# Patient Record
Sex: Female | Born: 1965 | Race: White | Hispanic: No | Marital: Married | Smoking: Never smoker
Health system: Southern US, Community
[De-identification: ages and names within clinical notes are randomized; demographics above are authoritative.]

## PROBLEM LIST (undated history)

## (undated) DIAGNOSIS — I10 Essential (primary) hypertension: Secondary | ICD-10-CM

## (undated) DIAGNOSIS — R609 Edema, unspecified: Secondary | ICD-10-CM

## (undated) DIAGNOSIS — R682 Dry mouth, unspecified: Secondary | ICD-10-CM

## (undated) DIAGNOSIS — I8390 Asymptomatic varicose veins of unspecified lower extremity: Secondary | ICD-10-CM

## (undated) DIAGNOSIS — E063 Autoimmune thyroiditis: Secondary | ICD-10-CM

## (undated) DIAGNOSIS — E282 Polycystic ovarian syndrome: Secondary | ICD-10-CM

## (undated) DIAGNOSIS — F32A Depression, unspecified: Secondary | ICD-10-CM

## (undated) DIAGNOSIS — K829 Disease of gallbladder, unspecified: Secondary | ICD-10-CM

## (undated) DIAGNOSIS — E079 Disorder of thyroid, unspecified: Secondary | ICD-10-CM

## (undated) DIAGNOSIS — F988 Other specified behavioral and emotional disorders with onset usually occurring in childhood and adolescence: Secondary | ICD-10-CM

## (undated) HISTORY — DX: Disease of gallbladder, unspecified: K82.9

## (undated) HISTORY — DX: Asymptomatic varicose veins of unspecified lower extremity: I83.90

## (undated) HISTORY — DX: Autoimmune thyroiditis: E06.3

## (undated) HISTORY — DX: Depression, unspecified: F32.A

## (undated) HISTORY — PX: OVARIAN CYST REMOVAL: SHX89

## (undated) HISTORY — DX: Essential (primary) hypertension: I10

## (undated) HISTORY — DX: Dry mouth, unspecified: R68.2

## (undated) HISTORY — DX: Edema, unspecified: R60.9

## (undated) HISTORY — DX: Polycystic ovarian syndrome: E28.2

## (undated) HISTORY — DX: Disorder of thyroid, unspecified: E07.9

## (undated) HISTORY — DX: Other specified behavioral and emotional disorders with onset usually occurring in childhood and adolescence: F98.8

---

## 1998-07-25 ENCOUNTER — Other Ambulatory Visit: Admission: RE | Admit: 1998-07-25 | Discharge: 1998-07-25 | Payer: Self-pay | Admitting: *Deleted

## 1998-11-14 ENCOUNTER — Other Ambulatory Visit: Admission: RE | Admit: 1998-11-14 | Discharge: 1998-11-14 | Payer: Self-pay | Admitting: *Deleted

## 1999-12-08 ENCOUNTER — Other Ambulatory Visit: Admission: RE | Admit: 1999-12-08 | Discharge: 1999-12-08 | Payer: Self-pay | Admitting: *Deleted

## 2000-01-02 ENCOUNTER — Encounter (INDEPENDENT_AMBULATORY_CARE_PROVIDER_SITE_OTHER): Payer: Self-pay

## 2000-01-02 ENCOUNTER — Other Ambulatory Visit: Admission: RE | Admit: 2000-01-02 | Discharge: 2000-01-02 | Payer: Self-pay | Admitting: *Deleted

## 2000-02-05 ENCOUNTER — Ambulatory Visit (HOSPITAL_COMMUNITY): Admission: RE | Admit: 2000-02-05 | Discharge: 2000-02-05 | Payer: Self-pay | Admitting: *Deleted

## 2000-05-25 ENCOUNTER — Other Ambulatory Visit: Admission: RE | Admit: 2000-05-25 | Discharge: 2000-05-25 | Payer: Self-pay | Admitting: *Deleted

## 2001-01-03 ENCOUNTER — Encounter (INDEPENDENT_AMBULATORY_CARE_PROVIDER_SITE_OTHER): Payer: Self-pay

## 2001-01-03 ENCOUNTER — Other Ambulatory Visit: Admission: RE | Admit: 2001-01-03 | Discharge: 2001-01-03 | Payer: Self-pay | Admitting: *Deleted

## 2002-02-23 ENCOUNTER — Other Ambulatory Visit: Admission: RE | Admit: 2002-02-23 | Discharge: 2002-02-23 | Payer: Self-pay | Admitting: *Deleted

## 2003-03-27 ENCOUNTER — Other Ambulatory Visit: Admission: RE | Admit: 2003-03-27 | Discharge: 2003-03-27 | Payer: Self-pay | Admitting: *Deleted

## 2006-06-23 ENCOUNTER — Ambulatory Visit: Payer: Self-pay | Admitting: General Practice

## 2007-01-21 ENCOUNTER — Ambulatory Visit (HOSPITAL_COMMUNITY): Admission: RE | Admit: 2007-01-21 | Discharge: 2007-01-21 | Payer: Self-pay | Admitting: Family Medicine

## 2007-02-04 ENCOUNTER — Ambulatory Visit (HOSPITAL_COMMUNITY): Admission: RE | Admit: 2007-02-04 | Discharge: 2007-02-04 | Payer: Self-pay | Admitting: Family Medicine

## 2007-06-28 ENCOUNTER — Ambulatory Visit: Payer: Self-pay | Admitting: General Practice

## 2008-05-09 ENCOUNTER — Ambulatory Visit (HOSPITAL_COMMUNITY): Admission: RE | Admit: 2008-05-09 | Discharge: 2008-05-09 | Payer: Self-pay | Admitting: Family Medicine

## 2008-07-03 ENCOUNTER — Ambulatory Visit: Payer: Self-pay | Admitting: General Practice

## 2009-07-09 ENCOUNTER — Ambulatory Visit: Payer: Self-pay | Admitting: General Practice

## 2009-12-18 ENCOUNTER — Encounter: Payer: Self-pay | Admitting: Cardiovascular Disease

## 2010-01-07 ENCOUNTER — Ambulatory Visit: Payer: Self-pay | Admitting: Vascular Surgery

## 2010-01-07 ENCOUNTER — Ambulatory Visit (HOSPITAL_COMMUNITY): Admission: RE | Admit: 2010-01-07 | Discharge: 2010-01-07 | Payer: Self-pay | Admitting: Nurse Practitioner

## 2010-01-10 DIAGNOSIS — R609 Edema, unspecified: Secondary | ICD-10-CM | POA: Insufficient documentation

## 2010-01-10 DIAGNOSIS — E782 Mixed hyperlipidemia: Secondary | ICD-10-CM | POA: Insufficient documentation

## 2010-01-10 DIAGNOSIS — E785 Hyperlipidemia, unspecified: Secondary | ICD-10-CM | POA: Insufficient documentation

## 2010-01-16 ENCOUNTER — Ambulatory Visit: Payer: Self-pay | Admitting: Cardiovascular Disease

## 2010-01-17 ENCOUNTER — Ambulatory Visit: Payer: Self-pay

## 2010-01-17 ENCOUNTER — Ambulatory Visit: Payer: Self-pay | Admitting: Internal Medicine

## 2010-01-17 ENCOUNTER — Encounter: Payer: Self-pay | Admitting: Cardiovascular Disease

## 2010-01-17 ENCOUNTER — Ambulatory Visit (HOSPITAL_COMMUNITY): Admission: RE | Admit: 2010-01-17 | Discharge: 2010-01-17 | Payer: Self-pay | Admitting: *Deleted

## 2010-03-22 ENCOUNTER — Ambulatory Visit: Payer: Self-pay | Admitting: General Practice

## 2010-07-01 ENCOUNTER — Ambulatory Visit: Payer: Self-pay | Admitting: General Practice

## 2010-09-28 ENCOUNTER — Encounter: Payer: Self-pay | Admitting: Family Medicine

## 2010-10-09 NOTE — Assessment & Plan Note (Signed)
Summary: np6/elevated D-Dimer/swelling/edema ankles.SOB   Visit Type:  Initial Consult Primary Provider:  Elie Goody, [facility]  CC:  pt here for elevated D-dimer...edema/ankles....  History of Present Illness: 45 yo female with history of hypothyroidism here today as a new cardiac patient for further evaluation of bilateral lower extremity swelling for four to six months. The swelling does improve some at night. She was started on Lasix one month ago and this really did not make a difference. She has had no chest pain or SOB. She describes black tongue felt to be secondary to a fungus and was treated with Nystatin. Overall, she feels well. She describes problems with varicosities in the past in the left leg. Recent labwork from primary care included a D-dimer which was mildly elevated at 0.5. Subsequent lower extremity venous dopplers at [facility] were negative for DVT. There was no evidence of a Bakers cyst. She describes no symptoms c/w angina or pulmonary embolism. She has no risk factors for DVT.   Current Medications (verified): 1)  Armour Thyroid 300 Mg Tabs (Thyroid) .Marland Kitchen.. 1 Tab Once Daily 2)  Spironolactone 50 Mg Tabs (Spironolactone) .Marland Kitchen.. 1 Tab Once Daily 3)  Naltrexone Hcl 50 Mg Tabs (Naltrexone Hcl) .Marland Kitchen.. 1 Tab At Bedtime 4)  Vitamin D (Ergocalciferol) 50000 Unit Caps (Ergocalciferol) .Marland Kitchen.. 1 Tab Bi-Weekly 5)  Multivitamins   Tabs (Multiple Vitamin) .Marland Kitchen.. 1 Tab Once Daily 6)  Aspirin 81 Mg Tbec (Aspirin) .... Take One Tablet By Mouth Daily  Allergies (verified): 1)  ! Sulfa  Past History:  Past Medical History: HYPERLIPIDEMIA Hypothryoidism Seasonal alllergies      Past Surgical History: None  Family History: Mother-alive, healthy Father-alive, healthy 3 sisters and one brother-all alive and heatlhy  No CAD.  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  The patient denies fatigue, malaise, fever, weight gain/loss, vision loss, decreased hearing, hoarseness, chest pain, palpitations, shortness of breath, prolonged cough, wheezing, sleep apnea, coughing up blood, abdominal pain, blood in stool, nausea, vomiting, diarrhea, heartburn, incontinence, blood in urine, muscle weakness, joint pain, rash, skin lesions, headache, fainting, dizziness, depression, anxiety, enlarged lymph nodes, easy bruising or bleeding, and environmental allergies.    Vital Signs:  Patient profile:   45 year old female Height:      66 inches Weight:      204 pounds BMI:     33.05 Pulse rate:   81 / minute Pulse rhythm:   regular BP sitting:   120 / 80  (left arm) Cuff size:   large  Vitals Entered By: Danielle Rankin, CMA (Jan 16, 2010 3:36 PM)  Physical Exam  General:  General: Well developed, well nourished, NAD HEENT: OP clear, mucus membranes moist SKIN: warm, dry Neuro: No focal deficits Musculoskeletal: Muscle strength 5/5 all ext Psychiatric: Mood and affect normal Neck: No JVD, no carotid bruits, no thyromegaly, no lymphadenopathy. Lungs:Clear bilaterally, no wheezes, rhonci, crackles CV: RRR no murmurs, gallops rubs Abdomen: soft, NT, ND, BS present Extremities: Trace lower extremity edema, pulses 2+.    EKG  Procedure date:  01/16/2010  Findings:      NSR, rate 81 bpm. Normal EKG.  Impression & Recommendations:  Problem # 1:  EDEMA (ICD-782.3) Her edema has been present for at least several months and seems to be dependent. There was no real improvement with Lasix. No  evidence of DVT on recent venous dopplers. No evidence of malnutrition. No offending medications. It is possible that there is venous insufficiency with imcompetence of her lower extremity veins. Her risk of obstructive CAD is low as her only cardiac risk factor is borderline hyperlipidemia. Will check an echo to make sure her LV function is normal.  I have low suspicion for pulmonary emblolism, as she has no tachycardia, chest pain or SOB. The elevated D-dimer is non-specific and in the setting of negative lower extremity dopplers and no SOB/CP, most likely not meaningful. If her echo shows normal LV function, I would suggest referral to a vein specialist. Dr. Molli Hazard specializing in venous disorders.   Other Orders: Echocardiogram (Echo)  Patient Instructions: 1)  Your physician recommends that you schedule a follow-up appointment as needed 2)  Your physician has requested that you have an echocardiogram.  Echocardiography is a painless test that uses sound waves to create images of your heart. It provides your doctor with information about the size and shape of your heart and how well your heart's chambers and valves are working.  This procedure takes approximately one hour. There are no restrictions for this procedure.

## 2011-01-23 NOTE — Op Note (Signed)
[facility]  Patient:    Olivia Hamilton, Olivia Hamilton                      MRN: [id number] Proc. Date: 02/05/00 Adm. 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  OPERATION:                    Laser vaporization of dysplasia and condyloma.  SURGEON:                      Georgina Peer, M.D.  ASSISTANT:  ANESTHESIA:                   General plus 10 cc of 1% Xylocaine with epinephrine 1:100,000 intracervical block.  ESTIMATED BLOOD LOSS:         Less than 25 cc.  COMPLICATIONS:                None.  INDICATIONS:                  A 45 year old white female with abnormal Pap smear, colposcopy revealed posterior cervical lip condyloma and cervical dysplasia. The dysplasia was of a mild grade.  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 She is brought in for treatment of these lesions.  DESCRIPTION OF PROCEDURE:     She was taken to the operating room and after given a general anesthesia, placed in the dorsal lithotomy position.  External genitals  were inspected and no external genital condyloma or vaginal condyloma were noted. Acetic acid was applied to the cervix and cervical condyloma and also punctation was noted as previously described in colposcopy in the office.  An intracervical block using 10 cc of 1% Xylocaine with epinephrine 1:100,000 was injected and the cervix blanched.  Lugols iodine stained the cervix and nonstaining areas were identified.  An 8 mm margin around the nonstaining areas was taken with the CO2  laser in a defocused mode using 25 watts of power.  3 to 4 mm of destruction including the entire condylomatous mass was vaporized using the CO2 laser.  The  transformation zone was vaporized to a  level of 10 mm.  There was excellent hemostasis.  The char was wiped away and Monsels applied to the treated area. Sponge, needle, and instrument counts were correct.  The patient was returned to the recovery room in good condition. DD:  02/05/00 TD:  02/05/00 Job: [id number]

## 2011-06-30 ENCOUNTER — Ambulatory Visit: Payer: Self-pay

## 2012-02-08 ENCOUNTER — Encounter: Payer: Self-pay | Admitting: Vascular Surgery

## 2012-02-09 ENCOUNTER — Encounter: Payer: Self-pay | Admitting: Vascular Surgery

## 2012-02-09 ENCOUNTER — Ambulatory Visit (INDEPENDENT_AMBULATORY_CARE_PROVIDER_SITE_OTHER): Payer: BC Managed Care – PPO | Admitting: *Deleted

## 2012-02-09 ENCOUNTER — Ambulatory Visit (INDEPENDENT_AMBULATORY_CARE_PROVIDER_SITE_OTHER): Payer: BC Managed Care – PPO | Admitting: Vascular Surgery

## 2012-02-09 VITALS — BP 157/92 | HR 80 | Resp 16 | Ht 66.0 in | Wt 195.0 lb

## 2012-02-09 DIAGNOSIS — I83893 Varicose veins of bilateral lower extremities with other complications: Secondary | ICD-10-CM

## 2012-02-09 DIAGNOSIS — I8310 Varicose veins of unspecified lower extremity with inflammation: Secondary | ICD-10-CM

## 2012-02-09 NOTE — Progress Notes (Signed)
Subjective:     Patient ID: Olivia Hamilton, female   DOB: [birth year], 46 y.o.   MRN: [id number]  HPI this 45 year old female has had bulging varicose veins in her left medial calf since she was a teenager. She has no history of DVT or thrombophlebitis. She has developed increasing aching throbbing and burning discomfort in the distal thigh and calf extending down into the ankle and her left lower leg from the midcalf distally swells as the day progresses. She is unable to elevate her legs much at work. She would occasionally wear elastic compression stockings with no improvement. She has no history of stasis ulcers, bleeding, or other complications. She has no symptoms in the contralateral right leg. She was seen by Dr. early 11 years ago but did not desire treatment at that time and her symptoms have become significantly worse as well as the onset of the swelling. It is now beginning to affect her daily living and ability to work.  Past Medical History  Diagnosis Date  . Varicose veins     History  Substance Use Topics  . Smoking status: Never Smoker   . Smokeless tobacco: Not on file  . Alcohol Use: 1.2 oz/week    1 Glasses of wine, 1 Cans of beer per week    History reviewed. No pertinent family history.  Allergies  Allergen Reactions  . Sulfonamide Derivatives     REACTION: eye swelling    Current outpatient prescriptions:aspirin 81 MG tablet, Take 81 mg by mouth daily., Disp: , Rfl: ;  lisdexamfetamine (VYVANSE) 20 MG capsule, Take 20 mg by mouth every morning., Disp: , Rfl: ;  progesterone (PROMETRIUM) 100 MG capsule, Take 100 mg by mouth daily., Disp: , Rfl: ;  thyroid (ARMOUR) 300 MG tablet, Take 300 mg by mouth daily., Disp: , Rfl:   BP 157/92  Pulse 80  Resp 16  Ht 5\' 6"  (1.676 m)  Wt 195 lb (88.451 kg)  BMI 31.47 kg/m2  Body mass index is 31.47 kg/(m^2).          Review of Systems denies chest pain, dyspnea on exertion, PND, orthopnea, chronic bronchitis,  claudication.-All systems negative complete review of systems    Objective:   Physical Exam blood pressure 157/92 heart rate 80 respirations 16 Gen.-alert and oriented x3 in no apparent distress HEENT normal for age Lungs no rhonchi or wheezing Cardiovascular regular rhythm no murmurs carotid pulses 3+ palpable no bruits audible Abdomen soft nontender no palpable masses Musculoskeletal free of  major deformities Skin clear -no rashes Neurologic normal Lower extremities 3+ femoral and dorsalis pedis pulses palpable bilaterally with 1+ edema left ankle no edema right ankle. Left leg with bulging varicosities in the proximal medial calf area over the great saphenous system extending into the posterior calf and down distally toward the medial malleolus. There is minimal hyperpigmentation no active ulcers are noted.  Today I ordered a venous duplex exam of the left leg which are reviewed and interpreted. There is no DVT. The left great saphenous vein has gross reflux from the knee to the saphenofemoral junction and is supplying these bulging varicosities. Small saphenous vein is unremarkable      Assessment:     severe venous insufficiency left leg with secondary bulging varicosities causing significant symptoms affecting patient's daily living and ability to work  Severe reflux left great saphenous vein supplying these bulging varicosities    Plan:     #1 long-leg elastic compression stockings 20-30 mm gradient #2  elevate legs at home is much as possible #3 ibuprofen on daily basis #4 return in 3 months-no significant improvement she needs a laser ablation left great saphenous vein with 10-20 stab phlebectomy of secondary varicosities as single procedure

## 2012-02-15 NOTE — Procedures (Unsigned)
LOWER EXTREMITY VENOUS REFLUX EXAM  INDICATION:  Left varicose veins with pain and discomfort.  EXAM:  Using color-flow imaging and pulse Doppler spectral analysis, the left common femoral, superficial femoral, popliteal, posterior tibial, greater and lesser saphenous veins are evaluated.  There is evidence suggesting deep venous insufficiency in the left lower extremity.  The left saphenofemoral junction is not competent with reflux of >500 milliseconds. The left GSV is not competent with reflux of >500 milliseconds with the caliber as described below.   The left proximal short saphenous vein demonstrates competency.  GSV Diameter (used if found to be incompetent only)                                           Right    Left Proximal Greater Saphenous Vein           cm       0.98 cm Proximal-to-mid-thigh                     cm       0.77 cm Mid thigh                                 cm       0.60 cm Mid-distal thigh                          cm       cm Distal thigh                              cm       0.67 cm Knee                                      cm       0.57 cm  IMPRESSION: 1. The left greater saphenous vein is not competent with reflux >500     milliseconds. 2. The left greater saphenous vein is not tortuous. 3. The deep venous system is not competent with reflux of >500     milliseconds. 4. The left small saphenous vein is competent.  ___________________________________________ Quita Skye. Hart Rochester, M.D.  EM/MEDQ  D:  02/10/2012  T:  02/10/2012  Job:  [id number]

## 2012-05-16 ENCOUNTER — Encounter: Payer: Self-pay | Admitting: Vascular Surgery

## 2012-05-17 ENCOUNTER — Encounter: Payer: Self-pay | Admitting: Vascular Surgery

## 2012-05-17 ENCOUNTER — Ambulatory Visit (INDEPENDENT_AMBULATORY_CARE_PROVIDER_SITE_OTHER): Payer: BC Managed Care – PPO | Admitting: Vascular Surgery

## 2012-05-17 VITALS — BP 140/92 | HR 73 | Resp 18 | Ht 66.0 in | Wt 190.0 lb

## 2012-05-17 DIAGNOSIS — I83893 Varicose veins of bilateral lower extremities with other complications: Secondary | ICD-10-CM

## 2012-05-17 NOTE — Progress Notes (Signed)
Subjective:     Patient ID: Olivia Hamilton, female   DOB: 10-Sep-1965, 46 y.o.   MRN: [id number]  HPI this 45 year old female returns for further followup regarding her severe venous insufficiency of the left leg. She has had aching throbbing and burning discomfort in these painful varicosities in the lower thigh and medial calf area for the past several years which have worsened over the past several months. She has tried long-leg elastic compression stockings-20-30 mm gradient as well as elevation and ibuprofen and this has not improved her symptomatology. She has gross reflux in the left great saphenous vein supplying these bulging varicosities which was demonstrated on formal ultrasound study at her last visit in June of 2013. She has no history of stasis ulcers or bleeding or DVT.  Past Medical History  Diagnosis Date  . Varicose veins   . Thyroid disease     History  Substance Use Topics  . Smoking status: Never Smoker   . Smokeless tobacco: Never Used  . Alcohol Use: 1.2 oz/week    1 Glasses of wine, 1 Cans of beer per week    Family History  Problem Relation Age of Onset  . Other Mother     VARICOSE VEINS  . Arthritis Mother   . Hypertension Mother   . Hypertension Father     Allergies  Allergen Reactions  . Sulfonamide Derivatives     REACTION: eye swelling    Current outpatient prescriptions:aspirin 81 MG tablet, Take 81 mg by mouth daily., Disp: , Rfl: ;  lisdexamfetamine (VYVANSE) 20 MG capsule, Take 20 mg by mouth every morning., Disp: , Rfl: ;  progesterone (PROMETRIUM) 100 MG capsule, Take 100 mg by mouth daily., Disp: , Rfl: ;  thyroid (ARMOUR) 300 MG tablet, Take 300 mg by mouth daily., Disp: , Rfl:   BP 140/92  Pulse 73  Resp 18  Ht 5\' 6"  (1.676 m)  Wt 190 lb (86.183 kg)  BMI 30.67 kg/m2  Body mass index is 30.67 kg/(m^2).           Review of Systems denies chest pain, dyspnea on exertion, PND, orthopnea, claudication to     Objective:   Physical Exam blood pressure 140/92 heart rate 73 respirations 18 Lungs no rhonchi or wheezing Cardiovascular rhythm no murmurs Left leg with bulging varicosities in the greater saphenous systems beginning in the distal medial thigh extending into the medial calf anteriorly and posteriorly. There is 1+ distal edema with no ulceration or hyperpigmentation noted. 3+ dorsalis pedis pulses palpable.    Assessment:     Severe venous insufficiency left leg with gross reflux left great saphenous system supplying bulging varicosities which are resistant to medical management and affecting patient daily living    Plan:     Patient needs a laser ablation left great saphenous vein with greater than 20 stab phlebectomy to treat these painful varicosities. Will proceed with precertification to perform this in near future

## 2012-05-24 ENCOUNTER — Other Ambulatory Visit: Payer: Self-pay | Admitting: *Deleted

## 2012-05-24 DIAGNOSIS — I83893 Varicose veins of bilateral lower extremities with other complications: Secondary | ICD-10-CM

## 2012-06-28 ENCOUNTER — Ambulatory Visit: Payer: Self-pay

## 2012-07-04 ENCOUNTER — Other Ambulatory Visit: Payer: BC Managed Care – PPO | Admitting: Vascular Surgery

## 2012-07-12 ENCOUNTER — Ambulatory Visit: Payer: BC Managed Care – PPO | Admitting: Vascular Surgery

## 2012-07-22 ENCOUNTER — Other Ambulatory Visit: Payer: Self-pay | Admitting: *Deleted

## 2012-07-22 DIAGNOSIS — I83893 Varicose veins of bilateral lower extremities with other complications: Secondary | ICD-10-CM

## 2012-09-26 ENCOUNTER — Other Ambulatory Visit: Payer: BC Managed Care – PPO | Admitting: Vascular Surgery

## 2012-09-27 ENCOUNTER — Ambulatory Visit: Payer: BC Managed Care – PPO | Admitting: Vascular Surgery

## 2012-10-03 ENCOUNTER — Other Ambulatory Visit: Payer: Self-pay | Admitting: *Deleted

## 2012-10-03 DIAGNOSIS — I83893 Varicose veins of bilateral lower extremities with other complications: Secondary | ICD-10-CM

## 2012-10-04 ENCOUNTER — Ambulatory Visit: Payer: BC Managed Care – PPO | Admitting: Vascular Surgery

## 2012-10-14 ENCOUNTER — Encounter: Payer: Self-pay | Admitting: Vascular Surgery

## 2012-10-17 ENCOUNTER — Ambulatory Visit (INDEPENDENT_AMBULATORY_CARE_PROVIDER_SITE_OTHER): Payer: PRIVATE HEALTH INSURANCE | Admitting: Vascular Surgery

## 2012-10-17 ENCOUNTER — Encounter: Payer: Self-pay | Admitting: Vascular Surgery

## 2012-10-17 VITALS — BP 142/96 | HR 80 | Resp 18 | Ht 66.0 in | Wt 190.0 lb

## 2012-10-17 DIAGNOSIS — I83893 Varicose veins of bilateral lower extremities with other complications: Secondary | ICD-10-CM

## 2012-10-17 NOTE — Progress Notes (Signed)
Laser Ablation Procedure      Date: 10/17/2012    Olivia Hamilton DOB:1966/07/22  Consent signed: Yes  Surgeon:J.D. Hart Rochester  Procedure: Laser Ablation: left Greater Saphenous Vein  BP 142/96  Pulse 80  Resp 18  Ht 5\' 6"  (1.676 m)  Wt 190 lb (86.183 kg)  BMI 30.68 kg/m2  Start time: 3:05   End time: 4:15  Tumescent Anesthesia: 300 cc 0.9% NaCl with 50 cc Lidocaine HCL with 1% Epi and 15 cc 8.4% NaHCO3  Local Anesthesia: 9 cc Lidocaine HCL and NaHCO3 (ratio 2:1)  Pulsed mode:yes Watts 15 Seconds 1 Pulses:1 Total Pulses:140 Total Energy: 2100 Total Time: 2:20     Stab Phlebectomy: >20 Sites: Thigh and Calf  Patient tolerated procedure well: Yes  Notes:   Description of Procedure:  After marking the course of the saphenous vein and the secondary varicosities in the standing position, the patient was placed on the operating table in the supine position, and the left leg was prepped and draped in sterile fashion. Local anesthetic was administered, and under ultrasound guidance the saphenous vein was accessed with a micro needle and guide wire; then the micro puncture sheath was placed. A guide wire was inserted to the saphenofemoral junction, followed by a 5 french sheath.  The position of the sheath and then the laser fiber below the junction was confirmed using the ultrasound and visualization of the aiming beam.  Tumescent anesthesia was administered along the course of the saphenous vein using ultrasound guidance. Protective laser glasses were placed on the patient, and the laser was fired at 15 watt pulsed mode advancing 1-2 mm per sec.  For a total of 2100 joules.  A steri strip was applied to the puncture site.  The patient was then put into Trendelenburg position.  Local anesthetic was utilized overlying the marked varicosities.  Greater than 20 stab wounds were made using the tip of an 11 blade; and using the vein hook,  The phlebectomies were performed using a hemostat to  avulse these varicosities.  Adequate hemostasis was achieved, and steri strips were applied to the stab wound.      ABD pads and thigh high compression stockings were applied.  Ace wrap bandages were applied over the phlebectomy sites and at the top of the saphenofemoral junction.  Blood loss was less than 15 cc.  The patient ambulated out of the operating room having tolerated the procedure well.

## 2012-10-17 NOTE — Progress Notes (Signed)
Subjective:     Patient ID: Olivia Hamilton, female   DOB: 1966/03/24, 47 y.o.   MRN: [id number]  HPI this 47 year old female had laser ablation of left great saphenous vein from the knee to the saphenofemoral junction plus greater than 20 stab phlebectomy of secondary bulging varicosities performed under local tumescent anesthesia. She tolerated the procedure   Review of Systems     Objective:   Physical Exam BP 142/96  Pulse 80  Resp 18  Ht 5\' 6"  (1.676 m)  Wt 190 lb (86.183 kg)  BMI 30.68 kg/m2        Assessment:    well-tolerated laser ablation left great saphenous vein plus greater than 20 stab phlebectomy of secondary varicosities performed under local tumescent anesthesia 2100 J of energy utilized    Plan:     Return February 25 for venous duplex exam left leg to confirm closure left great saphenous vein

## 2012-10-18 ENCOUNTER — Telehealth: Payer: Self-pay | Admitting: *Deleted

## 2012-10-18 ENCOUNTER — Encounter: Payer: Self-pay | Admitting: Vascular Surgery

## 2012-10-18 NOTE — Telephone Encounter (Signed)
Pt having slight discomfort and needed clarification on instructions. Otherwise doing well. Reminded her of her fu appt.

## 2012-10-31 ENCOUNTER — Encounter: Payer: Self-pay | Admitting: Neurosurgery

## 2012-11-01 ENCOUNTER — Encounter (INDEPENDENT_AMBULATORY_CARE_PROVIDER_SITE_OTHER): Payer: PRIVATE HEALTH INSURANCE

## 2012-11-01 ENCOUNTER — Encounter: Payer: Self-pay | Admitting: Vascular Surgery

## 2012-11-01 ENCOUNTER — Ambulatory Visit (INDEPENDENT_AMBULATORY_CARE_PROVIDER_SITE_OTHER): Payer: PRIVATE HEALTH INSURANCE | Admitting: Vascular Surgery

## 2012-11-01 VITALS — BP 148/106 | HR 83 | Resp 16 | Ht 66.0 in | Wt 190.0 lb

## 2012-11-01 DIAGNOSIS — I83893 Varicose veins of bilateral lower extremities with other complications: Secondary | ICD-10-CM

## 2012-11-01 DIAGNOSIS — Z48812 Encounter for surgical aftercare following surgery on the circulatory system: Secondary | ICD-10-CM

## 2012-11-01 NOTE — Progress Notes (Signed)
Subjective:     Patient ID: Olivia Hamilton, female   DOB: 06-01-1966, 47 y.o.   MRN: [id number]  HPI this 47 year old female returns 2 weeks post laser ablation left great saphenous vein with multiple stab phlebectomy for painful varicosities she has had some mild-to-moderate discomfort along the course of the great saphenous vein from the mid thigh to the inguinal area. This is improving. She did have a headache for a few days but that has resolved. She has had no significant distal edema and is wearing her elastic stockings and took ibuprofen for one week.    Review of Systems     Objective:   Physical Exam BP 148/106  Pulse 83  Resp 16  Ht 5\' 6"  (1.676 m)  Wt 190 lb (86.183 kg)  BMI 30.68 kg/m2  General well-developed well-nourished female in no apparent stress alert and oriented x3 Left lower extremity with palpable great saphenous vein from mid thigh to inguinal area which is mildly tender. No ecchymosis noted. Stab phlebectomy sites healing nicely. No distal edema noted.  Today I ordered a venous duplex exam which I reviewed and interpreted. Left leg great saphenous vein is totally closed up to near the saphenofemoral junction and there is no DVT     Assessment:     Successful and well-tolerated laser ablation left great saphenous vein with multiple stab phlebectomy for painful varicosities     Plan:     Return to see [place] on when necessary basis

## 2013-07-05 ENCOUNTER — Ambulatory Visit: Payer: Self-pay | Admitting: General Practice

## 2014-07-03 ENCOUNTER — Ambulatory Visit: Payer: Self-pay | Admitting: General Practice

## 2015-06-06 ENCOUNTER — Other Ambulatory Visit: Payer: Self-pay

## 2015-08-13 ENCOUNTER — Other Ambulatory Visit: Payer: Self-pay | Admitting: Obstetrics and Gynecology

## 2015-08-13 DIAGNOSIS — Z803 Family history of malignant neoplasm of breast: Secondary | ICD-10-CM

## 2015-12-04 ENCOUNTER — Other Ambulatory Visit: Payer: Self-pay | Admitting: Pain Medicine

## 2015-12-04 DIAGNOSIS — E041 Nontoxic single thyroid nodule: Secondary | ICD-10-CM

## 2016-04-29 ENCOUNTER — Ambulatory Visit
Admission: RE | Admit: 2016-04-29 | Discharge: 2016-04-29 | Disposition: A | Discharge status: Home / Self Care | Payer: PRIVATE HEALTH INSURANCE | Source: Ambulatory Visit | Attending: Pain Medicine | Admitting: Pain Medicine

## 2016-04-29 DIAGNOSIS — E041 Nontoxic single thyroid nodule: Secondary | ICD-10-CM

## 2017-01-28 ENCOUNTER — Ambulatory Visit: Payer: Self-pay | Admitting: *Deleted

## 2017-01-28 VITALS — BP 144/92 | Ht 66.0 in | Wt 219.0 lb

## 2017-01-28 DIAGNOSIS — Z0000 Encounter for general adult medical examination without abnormal findings: Secondary | ICD-10-CM

## 2017-01-28 NOTE — Progress Notes (Signed)
[employer] insurance premium discount evaluation: Labs Drawn. Replacements ROI form signed. Tobacco Free Attestation form signed.  Forms placed in paper chart.  

## 2017-01-29 LAB — CMP12+LP+TP+TSH+6AC+CBC/D/PLT
A/G RATIO: 2 (ref 1.2–2.2)
ALBUMIN: 4.3 g/dL (ref 3.5–5.5)
ALK PHOS: 103 IU/L (ref 39–117)
ALT: 23 IU/L (ref 0–32)
AST: 22 IU/L (ref 0–40)
BILIRUBIN TOTAL: 0.4 mg/dL (ref 0.0–1.2)
BUN/Creatinine Ratio: 15 (ref 9–23)
BUN: 13 mg/dL (ref 6–24)
Basophils Absolute: 0 10*3/uL (ref 0.0–0.2)
Basos: 1 %
CHLORIDE: 101 mmol/L (ref 96–106)
CHOL/HDL RATIO: 3 ratio (ref 0.0–4.4)
CREATININE: 0.88 mg/dL (ref 0.57–1.00)
Calcium: 9.3 mg/dL (ref 8.7–10.2)
Cholesterol, Total: 187 mg/dL (ref 100–199)
EOS (ABSOLUTE): 0.2 10*3/uL (ref 0.0–0.4)
Eos: 4 %
Estimated CHD Risk: <0.5 times avg. (ref 0.0–1.0)
Free Thyroxine Index: 1.3 (ref 1.2–4.9)
GFR, EST AFRICAN AMERICAN: 87 mL/min/{1.73_m2} (ref >59)
GFR, EST NON AFRICAN AMERICAN: 76 mL/min/{1.73_m2} (ref >59)
GGT: 13 IU/L (ref 0–60)
Globulin, Total: 2.1 g/dL (ref 1.5–4.5)
Glucose: 86 mg/dL (ref 65–99)
HDL: 63 mg/dL (ref >39)
Hematocrit: 42.9 % (ref 34.0–46.6)
Hemoglobin: 14.3 g/dL (ref 11.1–15.9)
IRON: 81 ug/dL (ref 27–159)
Immature Grans (Abs): 0 10*3/uL (ref 0.0–0.1)
Immature Granulocytes: 0 %
LDH: 205 IU/L (ref 119–226)
LDL Calculated: 109 mg/dL — ABNORMAL HIGH (ref 0–99)
LYMPHS ABS: 1.9 10*3/uL (ref 0.7–3.1)
Lymphs: 34 %
MCH: 29.4 pg (ref 26.6–33.0)
MCHC: 33.3 g/dL (ref 31.5–35.7)
MCV: 88 fL (ref 79–97)
MONOS ABS: 0.5 10*3/uL (ref 0.1–0.9)
Monocytes: 8 %
NEUTROS ABS: 2.9 10*3/uL (ref 1.4–7.0)
Neutrophils: 53 %
POTASSIUM: 4.2 mmol/L (ref 3.5–5.2)
Phosphorus: 2.6 mg/dL (ref 2.5–4.5)
Platelets: 249 10*3/uL (ref 150–379)
RBC: 4.86 x10E6/uL (ref 3.77–5.28)
RDW: 13.6 % (ref 12.3–15.4)
Sodium: 140 mmol/L (ref 134–144)
T3 Uptake Ratio: 21 % — ABNORMAL LOW (ref 24–39)
T4 TOTAL: 6.4 ug/dL (ref 4.5–12.0)
TSH: 0.009 u[IU]/mL — AB (ref 0.450–4.500)
Total Protein: 6.4 g/dL (ref 6.0–8.5)
Triglycerides: 73 mg/dL (ref 0–149)
Uric Acid: 5.9 mg/dL (ref 2.5–7.1)
VLDL Cholesterol Cal: 15 mg/dL (ref 5–40)
WBC: 5.6 10*3/uL (ref 3.4–10.8)

## 2017-01-29 LAB — HGB A1C W/O EAG: HEMOGLOBIN A1C: 5.1 % (ref 4.8–5.6)

## 2017-02-05 NOTE — Progress Notes (Signed)
Results reviewed with pt. Sts her pcp removed gluten and dairy from her diet so she was expecting an improvement in her lipids. No exercise routine currently. Encouraged 150 min/week. Reports difficult to get fiber in her diet due to avoiding gluten. Advised otc supplements such as Metamucil. Unable to find her PCP's practice in [place]. [facility] in [place]. Second copy given to pt to take to next pcp appt.

## 2017-07-15 ENCOUNTER — Ambulatory Visit: Payer: Self-pay | Admitting: Registered Nurse

## 2017-07-15 VITALS — BP 124/99 | HR 64 | Temp 98.4°F

## 2017-07-15 DIAGNOSIS — J0101 Acute recurrent maxillary sinusitis: Secondary | ICD-10-CM

## 2017-07-15 MED ORDER — SALINE SPRAY 0.65 % NA SOLN: 2.0000 | NASAL | 0 refills | Status: DC

## 2017-07-15 MED ORDER — FLUTICASONE PROPIONATE 50 MCG/ACT NA SUSP: 1.0000 | Freq: Two times a day (BID) | NASAL | 0 refills | Status: DC

## 2017-07-15 MED ORDER — AZITHROMYCIN 250 MG PO TABS: ORAL_TABLET | ORAL | 0 refills | Status: DC

## 2017-07-15 MED ORDER — ALBUTEROL SULFATE HFA 108 (90 BASE) MCG/ACT IN AERS: 1.0000 | INHALATION_SPRAY | RESPIRATORY_TRACT | 0 refills | Status: DC | PRN

## 2017-07-15 NOTE — Patient Instructions (Signed)
Sinus Rinse What is a sinus rinse? A sinus rinse is a simple home treatment that is used to rinse your sinuses with a sterile mixture of salt and water (saline solution). Sinuses are air-filled spaces in your skull behind the bones of your face and forehead that open into your nasal cavity. You will use the following:  Saline solution.  Neti pot or spray bottle. This releases the saline solution into your nose and through your sinuses. Neti pots and spray bottles can be purchased at your local pharmacy, a health food store, or online.  When would I do a sinus rinse? A sinus rinse can help to clear mucus, dirt, dust, or pollen from the nasal cavity. You may do a sinus rinse when you have a cold, a virus, nasal allergy symptoms, a sinus infection, or stuffiness in the nose or sinuses. If you are considering a sinus rinse:  Ask your child's health care provider before performing a sinus rinse on your child.  Do not do a sinus rinse if you have had ear or nasal surgery, ear infection, or blocked ears.  How do I do a sinus rinse?  Wash your hands.  Disinfect your device according to the directions provided and then dry it.  Use the solution that comes with your device or one that is sold separately in stores. Follow the mixing directions on the package.  Fill your device with the amount of saline solution as directed by the device instructions.  Stand over a sink and tilt your head sideways over the sink.  Place the spout of the device in your upper nostril (the one closer to the ceiling).  Gently pour or squeeze the saline solution into the nasal cavity. The liquid should drain to the lower nostril if you are not overly congested.  Gently blow your nose. Blowing too hard may cause ear pain.  Repeat in the other nostril.  Clean and rinse your device with clean water and then air-dry it. Are there risks of a sinus rinse? Sinus rinse is generally very safe and effective. However,  there are a few risks, which include:  A burning sensation in the sinuses. This may happen if you do not make the saline solution as directed. Make sure to follow all directions when making the saline solution.  Infection from contaminated water. This is rare, but possible.  Nasal irritation.  This information is not intended to replace advice given to you by your health care provider. Make sure you discuss any questions you have with your health care provider. Document Released: 03/21/2014 Document Revised: 07/21/2016 Document Reviewed: 01/09/2014 Elsevier Interactive Patient Education  2017 [employer] Sinusitis, Adult Sinusitis is soreness and inflammation of your sinuses. Sinuses are hollow spaces in the bones around your face. Your sinuses are located:  Around your eyes.  In the middle of your forehead.  Behind your nose.  In your cheekbones.  Your sinuses and nasal passages are lined with a stringy fluid (mucus). Mucus normally drains out of your sinuses. When your nasal tissues become inflamed or swollen, the mucus can become trapped or blocked so air cannot flow through your sinuses. This allows bacteria, viruses, and funguses to grow, which leads to infection. Sinusitis can develop quickly and last for 7?10 days (acute) or for more than 12 weeks (chronic). Sinusitis often develops after a cold. What are the causes? This condition is caused by anything that creates swelling in the sinuses or stops mucus from draining, including:  Allergies.    Asthma.  Bacterial or viral infection.  Abnormally shaped bones between the nasal passages.  Nasal growths that contain mucus (nasal polyps).  Narrow sinus openings.  Pollutants, such as chemicals or irritants in the air.  A foreign object stuck in the nose.  A fungal infection. This is rare.  What increases the risk? The following factors may make you more likely to develop this condition:  Having allergies or  asthma.  Having had a recent cold or respiratory tract infection.  Having structural deformities or blockages in your nose or sinuses.  Having a weak immune system.  Doing a lot of swimming or diving.  Overusing nasal sprays.  Smoking.  What are the signs or symptoms? The main symptoms of this condition are pain and a feeling of pressure around the affected sinuses. Other symptoms include:  Upper toothache.  Earache.  Headache.  Bad breath.  Decreased sense of smell and taste.  A cough that may get worse at night.  Fatigue.  Fever.  Thick drainage from your nose. The drainage is often green and it may contain pus (purulent).  Stuffy nose or congestion.  Postnasal drip. This is when extra mucus collects in the throat or back of the nose.  Swelling and warmth over the affected sinuses.  Sore throat.  Sensitivity to light.  How is this diagnosed? This condition is diagnosed based on symptoms, a medical history, and a physical exam. To find out if your condition is acute or chronic, your health care provider may:  Look in your nose for signs of nasal polyps.  Tap over the affected sinus to check for signs of infection.  View the inside of your sinuses using an imaging device that has a light attached (endoscope).  If your health care provider suspects that you have chronic sinusitis, you may also:  Be tested for allergies.  Have a sample of mucus taken from your nose (nasal culture) and checked for bacteria.  Have a mucus sample examined to see if your sinusitis is related to an allergy.  If your sinusitis does not respond to treatment and it lasts longer than 8 weeks, you may have an MRI or CT scan to check your sinuses. These scans also help to determine how severe your infection is. In rare cases, a bone biopsy may be done to rule out more serious types of fungal sinus disease. How is this treated? Treatment for sinusitis depends on the cause and  whether your condition is chronic or acute. If a virus is causing your sinusitis, your symptoms will go away on their own within 10 days. You may be given medicines to relieve your symptoms, including:  Topical nasal decongestants. They shrink swollen nasal passages and let mucus drain from your sinuses.  Antihistamines. These drugs block inflammation that is triggered by allergies. This can help to ease swelling in your nose and sinuses.  Topical nasal corticosteroids. These are nasal sprays that ease inflammation and swelling in your nose and sinuses.  Nasal saline washes. These rinses can help to get rid of thick mucus in your nose.  If your condition is caused by bacteria, you will be given an antibiotic medicine. If your condition is caused by a fungus, you will be given an antifungal medicine. Surgery may be needed to correct underlying conditions, such as narrow nasal passages. Surgery may also be needed to remove polyps. Follow these instructions at home: Medicines  Take, use, or apply over-the-counter and prescription medicines only as told by   your health care provider. These may include nasal sprays.  If you were prescribed an antibiotic medicine, take it as told by your health care provider. Do not stop taking the antibiotic even if you start to feel better. Hydrate and Humidify  Drink enough water to keep your urine clear or pale yellow. Staying hydrated will help to thin your mucus.  Use a cool mist humidifier to keep the humidity level in your home above 50%.  Inhale steam for 10-15 minutes, 3-4 times a day or as told by your health care provider. You can do this in the bathroom while a hot shower is running.  Limit your exposure to cool or dry air. Rest  Rest as much as possible.  Sleep with your head raised (elevated).  Make sure to get enough sleep each night. General instructions  Apply a warm, moist washcloth to your face 3-4 times a day or as told by your  health care provider. This will help with discomfort.  Wash your hands often with soap and water to reduce your exposure to viruses and other germs. If soap and water are not available, use hand sanitizer.  Do not smoke. Avoid being around people who are smoking (secondhand smoke).  Keep all follow-up visits as told by your health care provider. This is important. Contact a health care provider if:  You have a fever.  Your symptoms get worse.  Your symptoms do not improve within 10 days. Get help right away if:  You have a severe headache.  You have persistent vomiting.  You have pain or swelling around your face or eyes.  You have vision problems.  You develop confusion.  Your neck is stiff.  You have trouble breathing. This information is not intended to replace advice given to you by your health care provider. Make sure you discuss any questions you have with your health care provider. Document Released: 08/24/2005 Document Revised: 04/19/2016 Document Reviewed: 06/19/2015 Elsevier Interactive Patient Education  2017 [employer]  

## 2017-07-15 NOTE — Progress Notes (Signed)
Subjective:    Patient ID: Olivia Hamilton, female    DOB: 01/29/1966, 51 y.o.   MRN: [id number]  51y/o caucasian female established Pt reports maxillary and frontal sinus pain/pressure, bil otalgia, sore throat, clear productive cough x2 days.   Has been taking advil cold and sinus twice a day and tylenol po prn aches OTC.  Last URI/antibiotics 2015 allergic to sulfa and penicillin.  Denied seasonal allergies since dietary changes remove gluten and dairy  Denied teeth pain, ear discharge, nausea/vomiting, hemoptysis      Review of Systems  Constitutional: Negative for activity change, appetite change, chills, diaphoresis, fatigue, fever and unexpected weight change.  HENT: Positive for congestion, ear pain, postnasal drip, rhinorrhea, sinus pressure, sinus pain and sore throat. Negative for dental problem, drooling, ear discharge, facial swelling, hearing loss, mouth sores, nosebleeds, sneezing, tinnitus, trouble swallowing and voice change.   Eyes: Negative for photophobia, pain, discharge, redness, itching and visual disturbance.  Respiratory: Positive for cough. Negative for choking, chest tightness, shortness of breath, wheezing and stridor.   Cardiovascular: Negative for chest pain, palpitations and leg swelling.  Gastrointestinal: Negative for abdominal distention, abdominal pain, blood in stool, constipation, diarrhea, nausea and vomiting.  Endocrine: Negative for cold intolerance and heat intolerance.  Genitourinary: Negative for difficulty urinating, dysuria and hematuria.  Musculoskeletal: Negative for arthralgias, back pain, gait problem, joint swelling, myalgias, neck pain and neck stiffness.  Skin: Negative for color change, pallor, rash and wound.  Allergic/Immunologic: Positive for environmental allergies. Negative for food allergies.  Neurological: Positive for headaches. Negative for dizziness, tremors, seizures, syncope, facial asymmetry, speech difficulty, weakness,  light-headedness and numbness.  Hematological: Negative for adenopathy. Does not bruise/bleed easily.  Psychiatric/Behavioral: Positive for sleep disturbance. Negative for agitation, behavioral problems and confusion.       Objective:   Physical Exam  Constitutional: She is oriented to person, place, and time. She appears well-developed and well-nourished. She is active and cooperative.  Non-toxic appearance. She does not have a sickly appearance. She appears ill. No distress.  HENT:  Head: Normocephalic and atraumatic.  Right Ear: Hearing, external ear and ear canal normal. A middle ear effusion is present.  Left Ear: Hearing, external ear and ear canal normal. A middle ear effusion is present.  Nose: Mucosal edema and rhinorrhea present. No nose lacerations, sinus tenderness, nasal deformity, septal deviation or nasal septal hematoma. No epistaxis.  No foreign bodies. Right sinus exhibits maxillary sinus tenderness and frontal sinus tenderness. Left sinus exhibits maxillary sinus tenderness and frontal sinus tenderness.  Mouth/Throat: Uvula is midline and mucous membranes are normal. Mucous membranes are not pale, not dry and not cyanotic. She does not have dentures. No oral lesions. No trismus in the jaw. Normal dentition. No dental abscesses, uvula swelling, lacerations or dental caries. Posterior oropharyngeal edema and posterior oropharyngeal erythema present. No oropharyngeal exudate or tonsillar abscesses.  Bilateral allergic shiners; maxillary greater than frontal sinus TTP bilaterally; bilateral TMs air fluid level 50% opacity; cobblestoning posterior pharynx; bilateral nasal turbinates edema/erythema clear discharge  Eyes: Conjunctivae, EOM and lids are normal. Pupils are equal, round, and reactive to light. Right eye exhibits no chemosis, no discharge, no exudate and no hordeolum. No foreign body present in the right eye. Left eye exhibits no chemosis, no discharge, no exudate and no  hordeolum. No foreign body present in the left eye. Right conjunctiva is not injected. Right conjunctiva has no hemorrhage. Left conjunctiva is not injected. Left conjunctiva has no hemorrhage. No scleral icterus.  Right eye exhibits normal extraocular motion and no nystagmus. Left eye exhibits normal extraocular motion and no nystagmus. Right pupil is round and reactive. Left pupil is round and reactive. Pupils are equal.  Neck: Trachea normal and normal range of motion. Neck supple. No tracheal tenderness, no spinous process tenderness and no muscular tenderness present. No neck rigidity. No tracheal deviation, no edema, no erythema and normal range of motion present. No thyroid mass and no thyromegaly present.  Cardiovascular: Normal rate, regular rhythm, S1 normal, S2 normal, normal heart sounds and intact distal pulses. PMI is not displaced. Exam reveals no gallop and no friction rub.  No murmur heard. Pulmonary/Chest: Effort normal. No accessory muscle usage or stridor. No respiratory distress. She has no decreased breath sounds. She has wheezes in the right upper field. She has no rhonchi. She has no rales. She exhibits no tenderness.  Expiratory intermittent RUF wheeze; spoke full sentences without difficulty; frequent nonproductive cough in exam room  Abdominal: Soft. She exhibits no distension.  Musculoskeletal: Normal range of motion. She exhibits no edema or tenderness.       Right shoulder: Normal.       Left shoulder: Normal.       Right hip: Normal.       Left hip: Normal.       Right knee: Normal.       Left knee: Normal.       Cervical back: Normal.       Right hand: Normal.       Left hand: Normal.  Lymphadenopathy:       Head (right side): No submental, no submandibular, no tonsillar, no preauricular, no posterior auricular and no occipital adenopathy present.       Head (left side): No submental, no submandibular, no tonsillar, no preauricular, no posterior auricular and no  occipital adenopathy present.    She has no cervical adenopathy.       Right cervical: No superficial cervical, no deep cervical and no posterior cervical adenopathy present.      Left cervical: No superficial cervical, no deep cervical and no posterior cervical adenopathy present.  Neurological: She is alert and oriented to person, place, and time. She has normal strength. She is not disoriented. She displays no atrophy and no tremor. No cranial nerve deficit or sensory deficit. She exhibits normal muscle tone. She displays no seizure activity. Coordination and gait normal. GCS eye subscore is 4. GCS verbal subscore is 5. GCS motor subscore is 6.  Skin: Skin is warm, dry and intact. No abrasion, no bruising, no burn, no ecchymosis, no laceration, no lesion, no petechiae and no rash noted. She is not diaphoretic. No cyanosis or erythema. No pallor. Nails show no clubbing.  Psychiatric: She has a normal mood and affect. Her speech is normal and behavior is normal. Judgment and thought content normal. Cognition and memory are normal.  Nursing note and vitals reviewed.         Assessment & Plan:  A-recurrent maxillary sinusitis acute and acute bronchitis  P-Restart flonase 1 spray each nostril BID #1 RF0 dispensed from [facility], saline 2 sprays each nostril q2h wa prn congestion 1 bottle given to patient from clinic stock.  If no improvement with 48 hours of saline and flonase use start azithromycin 500mg  po day 1 then 250mg  po daily days 2-5 #6 RF0 dispensed from [facility].  Denied personal or family history of ENT cancer.  Shower BID especially prior to bed. No evidence of systemic  bacterial infection, non toxic and well hydrated.  I do not see where any further testing or imaging is necessary at this time.   I will suggest supportive care, rest, good hygiene and encourage the patient to take adequate fluids.  The patient is to return to clinic or EMERGENCY ROOM if symptoms worsen or change significantly.   Exitcare handout on sinusitis and sinus rinse given to patient.  Patient verbalized agreement and understanding of treatment plan and had no further questions at this time.   P2:  Hand washing and cover cough  May continue using OTC cold and sinus medications also to help with rhinitis.  Does not want to take oral steroids at this time.  If worsening consider starting taper.  Cough lozenges po q2h prn cough given 8 UD from clinic stock. Electronic Rx  Albuterol MDI 90mcg 1-2 puffs po q4-6h prn protracted cough/wheeze #1 RF0 side effect increased heart rate. Bronchitis simple, community acquired, may have started as viral (probably respiratory syncytial, parainfluenza, influenza, or adenovirus), but now evidence of acute purulent bronchitis with resultant bronchial edema and mucus formation.  Viruses are the most common cause of bronchial inflammation in otherwise healthy adults with acute bronchitis.  The appearance of sputum is not predictive of whether a bacterial infection is present.  Purulent sputum is most often caused by viral infections.  There are a small portion of those caused by non-viral agents being Mycoplama pneumonia.  Microscopic examination or C&S of sputum in the healthy adult with acute bronchitis is generally not helpful (usually negative or normal respiratory flora) other considerations being cough from upper respiratory tract infections, sinusitis or allergic syndromes (mild asthma or viral pneumonia).  Differential Diagnoses:  reactive airway disease (asthma, allergic aspergillosis (eosinophilia), chronic bronchitis, respiratory infection (sinusitis, common cold, pneumonia), congestive heart failure, reflux esophagitis, bronchogenic tumor, aspiration syndromes and/or exposure to pulmonary irritants/smoke. Without high fever, severe dyspnea, lack of physical findings or other risk factors, I will hold on a chest radiograph and CBC at this time.  I discussed that approximately 50% of  patients with acute bronchitis have a cough that lasts up to three weeks, and 25% for over a month.  Tylenol 500mg  one to two tablets every four to six hours as needed for fever or myalgias.  No aspirin. Exitcare handout on bronchitis and inhaler use given to patient.  ER if hemopthysis, SOB, worst chest pain of life.   Patient instructed to follow up in one week or sooner if symptoms worsen.  Patient verbalized agreement and understanding of treatment plan.  P2:  hand washing and cover cough

## 2017-11-12 DIAGNOSIS — M7542 Impingement syndrome of left shoulder: Secondary | ICD-10-CM | POA: Insufficient documentation

## 2018-05-03 ENCOUNTER — Ambulatory Visit: Payer: Self-pay | Admitting: Registered Nurse

## 2018-05-03 VITALS — BP 142/96 | HR 61

## 2018-05-03 DIAGNOSIS — H65192 Other acute nonsuppurative otitis media, left ear: Secondary | ICD-10-CM

## 2018-05-03 DIAGNOSIS — J019 Acute sinusitis, unspecified: Secondary | ICD-10-CM

## 2018-05-03 MED ORDER — ALBUTEROL SULFATE 108 (90 BASE) MCG/ACT IN AEPB: 1.0000 | INHALATION_SPRAY | RESPIRATORY_TRACT | 0 refills | Status: DC | PRN

## 2018-05-03 MED ORDER — FLUTICASONE PROPIONATE 50 MCG/ACT NA SUSP: 1.0000 | Freq: Two times a day (BID) | NASAL | 1 refills | Status: DC | PRN

## 2018-05-03 MED ORDER — PSEUDOEPH-BROMPHEN-DM 30-2-10 MG/5ML PO SYRP: 5.0000 mL | ORAL_SOLUTION | Freq: Four times a day (QID) | ORAL | 0 refills | Status: AC | PRN

## 2018-05-03 MED ORDER — SALINE SPRAY 0.65 % NA SOLN: 2.0000 | NASAL | 0 refills | Status: DC

## 2018-05-03 NOTE — Progress Notes (Signed)
Subjective:    Patient ID: Olivia Hamilton, female    DOB: 1966-03-21, 52 y.o.   MRN: [id number]  51y/o Caucasian female established pt c/o sore throat, maxillary and frontal sinus pain without dental pain or pressure behind eyes. Bilateral otalgia R>L with discomfort over R eustachian tube. +nasal congestion. Denies rhinorrhea. +nonproductive cough when lying down. No chest congestion. Coworkers and family members with similar sx. Her sx started Sunday evening. Only taking Tylenol at home. Ran out of flonase and albuterol from Rx last year needs refill.  Would like a Rx for bromphed liquid     Review of Systems  Constitutional: Negative for activity change, appetite change, chills, diaphoresis, fatigue, fever and unexpected weight change.  HENT: Positive for congestion, ear pain, postnasal drip, rhinorrhea, sinus pressure, sinus pain and sore throat. Negative for dental problem, drooling, ear discharge, facial swelling, hearing loss, mouth sores, nosebleeds, sneezing, tinnitus, trouble swallowing and voice change.   Eyes: Negative for photophobia, pain, discharge, redness, itching and visual disturbance.  Respiratory: Positive for cough. Negative for choking, chest tightness, shortness of breath, wheezing and stridor.   Cardiovascular: Negative for chest pain, palpitations and leg swelling.  Gastrointestinal: Negative for abdominal distention, abdominal pain, blood in stool, constipation, diarrhea, nausea and vomiting.  Endocrine: Negative for cold intolerance and heat intolerance.  Genitourinary: Negative for difficulty urinating, dysuria and hematuria.  Musculoskeletal: Negative for arthralgias, back pain, gait problem, joint swelling, myalgias, neck pain and neck stiffness.  Skin: Negative for color change, pallor, rash and wound.  Allergic/Immunologic: Positive for environmental allergies. Negative for food allergies.  Neurological: Positive for headaches. Negative for dizziness,  tremors, seizures, syncope, facial asymmetry, speech difficulty, weakness, light-headedness and numbness.  Hematological: Negative for adenopathy. Does not bruise/bleed easily.  Psychiatric/Behavioral: Negative for agitation, behavioral problems, confusion and sleep disturbance.       Objective:   Physical Exam  Constitutional: She is oriented to person, place, and time. She appears well-developed and well-nourished. She is active and cooperative.  Non-toxic appearance. She does not have a sickly appearance. She does not appear ill. No distress.  HENT:  Head: Normocephalic and atraumatic.  Right Ear: Hearing, external ear and ear canal normal. A middle ear effusion is present.  Left Ear: Hearing, external ear and ear canal normal. Tympanic membrane is erythematous. A middle ear effusion is present.  Nose: Mucosal edema and rhinorrhea present. No nose lacerations, sinus tenderness, nasal deformity, septal deviation or nasal septal hematoma. No epistaxis.  No foreign bodies. Right sinus exhibits no maxillary sinus tenderness and no frontal sinus tenderness. Left sinus exhibits no maxillary sinus tenderness and no frontal sinus tenderness.  Mouth/Throat: Uvula is midline and mucous membranes are normal. Mucous membranes are not pale, not dry and not cyanotic. She does not have dentures. No oral lesions. No trismus in the jaw. Normal dentition. No dental abscesses, uvula swelling, lacerations or dental caries. Posterior oropharyngeal edema and posterior oropharyngeal erythema present. No oropharyngeal exudate or tonsillar abscesses. Tonsils are 0 on the right. Tonsils are 0 on the left. No tonsillar exudate.  Bilateral allergic shiners; nasal turbinates edema/erythema clear discharge; left TM slight erythema 12-03 oclock air fluid level clear; right TM air fluid level clear; cobblestoning posterior pharynx  Eyes: Pupils are equal, round, and reactive to light. Conjunctivae, EOM and lids are normal. Right  eye exhibits no chemosis, no discharge, no exudate and no hordeolum. No foreign body present in the right eye. Left eye exhibits no chemosis, no discharge, no  exudate and no hordeolum. No foreign body present in the left eye. Right conjunctiva is not injected. Right conjunctiva has no hemorrhage. Left conjunctiva is not injected. Left conjunctiva has no hemorrhage. No scleral icterus. Right eye exhibits normal extraocular motion and no nystagmus. Left eye exhibits normal extraocular motion and no nystagmus. Right pupil is round and reactive. Left pupil is round and reactive. Pupils are equal.  Neck: Trachea normal, normal range of motion and phonation normal. Neck supple. No tracheal tenderness, no spinous process tenderness and no muscular tenderness present. No neck rigidity. No tracheal deviation, no edema, no erythema and normal range of motion present. No thyroid mass and no thyromegaly present.  Cardiovascular: Normal rate, regular rhythm, S1 normal, S2 normal, normal heart sounds and intact distal pulses. PMI is not displaced. Exam reveals no gallop and no friction rub.  No murmur heard. Pulmonary/Chest: Effort normal and breath sounds normal. No accessory muscle usage or stridor. No respiratory distress. She has no decreased breath sounds. She has no wheezes. She has no rhonchi. She has no rales. She exhibits no tenderness.  No cough observed in exam room; spoke full sentences without difficulty  Abdominal: Soft. Normal appearance. She exhibits no distension, no fluid wave and no ascites. There is no rigidity and no guarding.  Musculoskeletal: Normal range of motion. She exhibits no edema, tenderness or deformity.       Right shoulder: Normal.       Left shoulder: Normal.       Right elbow: Normal.      Left elbow: Normal.       Right hip: Normal.       Left hip: Normal.       Right knee: Normal.       Left knee: Normal.       Cervical back: Normal.       Thoracic back: Normal.       Lumbar  back: Normal.       Right hand: Normal.       Left hand: Normal.  Lymphadenopathy:       Head (right side): No submental, no submandibular, no tonsillar, no preauricular, no posterior auricular and no occipital adenopathy present.       Head (left side): No submental, no submandibular, no tonsillar, no preauricular, no posterior auricular and no occipital adenopathy present.    She has no cervical adenopathy.       Right cervical: No superficial cervical, no deep cervical and no posterior cervical adenopathy present.      Left cervical: No superficial cervical, no deep cervical and no posterior cervical adenopathy present.  Neurological: She is alert and oriented to person, place, and time. She has normal strength. She is not disoriented. She displays no atrophy and no tremor. No cranial nerve deficit or sensory deficit. She exhibits normal muscle tone. She displays no seizure activity. Coordination and gait normal. GCS eye subscore is 4. GCS verbal subscore is 5. GCS motor subscore is 6.  In/out of chair and on/off exam table without difficulty; gait sure and steady in exam room  Skin: Skin is warm, dry and intact. Capillary refill takes less than 2 seconds. No abrasion, no bruising, no burn, no ecchymosis, no laceration, no lesion, no petechiae and no rash noted. She is not diaphoretic. No cyanosis or erythema. No pallor. Nails show no clubbing.  Psychiatric: She has a normal mood and affect. Her speech is normal and behavior is normal. Judgment and thought content normal. She  is not actively hallucinating. Cognition and memory are normal. She is attentive.  Nursing note and vitals reviewed.         Assessment & Plan:  A-left otitis media, acute rhinosinusitis  P-Patient may use normal saline nasal spray 2 sprays each nostril q2h wa as needed given 1 bottle from clinic stock. flonase 50mcg 1 spray each nostril BID #1 RF1 electronic Rx to her pharmacy of choice.  Patient denied personal or  family history of ENT cancer.  OTC antihistamine of choice claritin/zyrtec 10mg  po daily.  Avoid triggers if possible.  Shower prior to bedtime if exposed to triggers.  If allergic dust/dust mites recommend mattress/pillow covers/encasements; washing linens, vacuuming, sweeping, dusting weekly.  Call or return to clinic as needed if these symptoms worsen or fail to improve as anticipated.   Exitcare handout on allergic rhinitis and sinus rinse.  Patient verbalized understanding of instructions, agreed with plan of care and had no further questions at this time.  P2:  Avoidance and hand washing.  Brompheniramine-pseudoephedrine-DM 30/2/10mg  per 5ml  Take 5ml po q6h prn cough/rhinitis  #118 RF0  Phenylephrine 5-10mg  po q6h prn rhinitis 4 UD given from clinic stock--stop when taking bromphed; Refilled albuterol MDI 90mcg 1-2 puffs po q4-6h prn protracted cough/wheeze #1 RF0 electronic Rx to her pharmacy of choice.  She didn't want azithromycin left ear slightly red recheck Thursday at 1130; nasal saline refilled albuterol  .Restart flonase 1 spray each nostril BID, saline 2 sprays each nostril q2h wa prn congestion.  If no improvement with 48 hours of saline and flonase, bromphed.  Denied personal or family history of ENT cancer.  Shower BID especially prior to bed. No evidence of systemic bacterial infection, non toxic and well hydrated.  I do not see where any further testing or imaging is necessary at this time.   I will suggest supportive care, rest, good hygiene and encourage the patient to take adequate fluids.  The patient is to return to clinic or EMERGENCY ROOM if symptoms worsen or change significantly.  Exitcare handout on sinusitis and sinus rinse given to patient.  Patient verbalized agreement and understanding of treatment plan and had no further questions at this time.   P2:  Hand washing and cover cough  Supportive treatment.   No evidence of invasive bacterial infection, non toxic and well  hydrated.  This is most likely self limiting viral infection.  I do not see where any further testing or imaging is necessary at this time.   I will suggest supportive care, rest, good hygiene and encourage the patient to take adequate fluids.  The patient is to return to clinic or EMERGENCY ROOM if symptoms worsen or change significantly e.g. ear pain, fever, purulent discharge from ears or bleeding.  Exitcare handout on otitis media with effusion given to patient.  Patient verbalized agreement and understanding of treatment plan.

## 2018-05-03 NOTE — Patient Instructions (Signed)
How to Use a Metered Dose Inhaler A metered dose inhaler is a handheld device for taking medicine that must be breathed into the lungs (inhaled). The device can be used to deliver a variety of inhaled medicines, including:  Quick relief or rescue medicines, such as bronchodilators.  Controller medicines, such as corticosteroids.  The medicine is delivered by pushing down on a metal canister to release a preset amount of spray and medicine. Each device contains the amount of medicine that is needed for a preset number of uses (inhalations). Your health care provider may recommend that you use a spacer with your inhaler to help you take the medicine more effectively. A spacer is a plastic tube with a mouthpiece on one end and an opening that connects to the inhaler on the other end. A spacer holds the medicine in a tube for a short time, which allows you to inhale more medicine. What are the risks? If you do not use your inhaler correctly, medicine might not reach your lungs to help you breathe. Inhaler medicine can cause side effects, such as:  Mouth or throat infection.  Cough.  Hoarseness.  Headache.  Nausea and vomiting.  Lung infection (pneumonia) in people who have a lung condition called COPD.  How to use a metered dose inhaler without a spacer 1. Remove the cap from the inhaler. 2. If you are using the inhaler for the first time, shake it for 5 seconds, turn it away from your face, then release 4 puffs into the air. This is called priming. 3. Shake the inhaler for 5 seconds. 4. Position the inhaler so the top of the canister faces up. 5. Put your index finger on the top of the medicine canister. Support the bottom of the inhaler with your thumb. 6. Breathe out normally and as completely as possible, away from the inhaler. 7. Either place the inhaler between your teeth and close your lips tightly around the mouthpiece, or hold the inhaler 1-2 inches (2.5-5 cm) away from your open  mouth. Keep your tongue down out of the way. If you are unsure which technique to use, ask your health care provider. 8. Press the canister down with your index finger to release the medicine, then inhale deeply and slowly through your mouth (not your nose) until your lungs are completely filled. Inhaling should take 4-6 seconds. 9. Hold the medicine in your lungs for 5-10 seconds (10 seconds is best). This helps the medicine get into the small airways of your lungs. 10. With your lips in a tight circle (pursed), breathe out slowly. 11. Repeat steps 3-10 until you have taken the number of puffs that your health care provider directed. Wait about 1 minute between puffs or as directed. 12. Put the cap on the inhaler. 13. If you are using a steroid inhaler, rinse your mouth with water, gargle, and spit out the water. Do not swallow the water. How to use a metered dose inhaler with a spacer 1. Remove the cap from the inhaler. 2. If you are using the inhaler for the first time, shake it for 5 seconds, turn it away from your face, then release 4 puffs into the air. This is called priming. 3. Shake the inhaler for 5 seconds. 4. Place the open end of the spacer onto the inhaler mouthpiece. 5. Position the inhaler so the top of the canister faces up and the spacer mouthpiece faces you. 6. Put your index finger on the top of the medicine canister.  Support the bottom of the inhaler and the spacer with your thumb. 7. Breathe out normally and as completely as possible, away from the spacer. 8. Place the spacer between your teeth and close your lips tightly around it. Keep your tongue down out of the way. 9. Press the canister down with your index finger to release the medicine, then inhale deeply and slowly through your mouth (not your nose) until your lungs are completely filled. Inhaling should take 4-6 seconds. 10. Hold the medicine in your lungs for 5-10 seconds (10 seconds is best). This helps the medicine  get into the small airways of your lungs. 11. With your lips in a tight circle (pursed), breathe out slowly. 12. Repeat steps 3-11 until you have taken the number of puffs that your health care provider directed. Wait about 1 minute between puffs or as directed. 13. Remove the spacer from the inhaler and put the cap on the inhaler. 14. If you are using a steroid inhaler, rinse your mouth with water, gargle, and spit out the water. Do not swallow the water. Follow these instructions at home:  Take your inhaled medicine only as told by your health care provider. Do not use the inhaler more than directed by your health care provider.  Keep all follow-up visits as told by your health care provider. This is important.  If your inhaler has a counter, you can check it to determine how full your inhaler is. If your inhaler does not have a counter, ask your health care provider when you will need to refill your inhaler and write the refill date on a calendar or on your inhaler canister. Note that you cannot know when an inhaler is empty by shaking it.  Follow directions on the package insert for care and cleaning of your inhaler and spacer. Contact a health care provider if:  Symptoms are only partially relieved with your inhaler.  You are having trouble using your inhaler.  You have an increase in phlegm.  You have headaches. Get help right away if:  You feel little or no relief after using your inhaler.  You have dizziness.  You have a fast heart rate.  You have chills or a fever.  You have night sweats.  There is blood in your phlegm. Summary  A metered dose inhaler is a handheld device for taking medicine that must be breathed into the lungs (inhaled).  The medicine is delivered by pushing down on a metal canister to release a preset amount of spray and medicine.  Each device contains the amount of medicine that is needed for a preset number of uses (inhalations). This  information is not intended to replace advice given to you by your health care provider. Make sure you discuss any questions you have with your health care provider. Document Released: 08/24/2005 Document Revised: 07/14/2016 Document Reviewed: 07/14/2016 Elsevier Interactive Patient Education  2017 [employer] Allergic Rhinitis, Adult Allergic rhinitis is an allergic reaction that affects the mucous membrane inside the nose. It causes sneezing, a runny or stuffy nose, and the feeling of mucus going down the back of the throat (postnasal drip). Allergic rhinitis can be mild to severe. There are two types of allergic rhinitis:  Seasonal. This type is also called hay fever. It happens only during certain seasons.  Perennial. This type can happen at any time of the year.  What are the causes? This condition happens when the body's defense system (immune system) responds to certain harmless substances  called allergens as though they were germs.  Seasonal allergic rhinitis is triggered by pollen, which can come from grasses, trees, and weeds. Perennial allergic rhinitis may be caused by:  House dust mites.  Pet dander.  Mold spores.  What are the signs or symptoms? Symptoms of this condition include:  Sneezing.  Runny or stuffy nose (nasal congestion).  Postnasal drip.  Itchy nose.  Tearing of the eyes.  Trouble sleeping.  Daytime sleepiness.  How is this diagnosed? This condition may be diagnosed based on:  Your medical history.  A physical exam.  Tests to check for related conditions, such as: ? Asthma. ? Pink eye. ? Ear infection. ? Upper respiratory infection.  Tests to find out which allergens trigger your symptoms. These may include skin or blood tests.  How is this treated? There is no cure for this condition, but treatment can help control symptoms. Treatment may include:  Taking medicines that block allergy symptoms, such as antihistamines. Medicine may  be given as a shot, nasal spray, or pill.  Avoiding the allergen.  Desensitization. This treatment involves getting ongoing shots until your body becomes less sensitive to the allergen. This treatment may be done if other treatments do not help.  If taking medicine and avoiding the allergen does not work, new, stronger medicines may be prescribed.  Follow these instructions at home:  Find out what you are allergic to. Common allergens include smoke, dust, and pollen.  Avoid the things you are allergic to. These are some things you can do to help avoid allergens: ? Replace carpet with wood, tile, or vinyl flooring. Carpet can trap dander and dust. ? Do not smoke. Do not allow smoking in your home. ? Change your heating and air conditioning filter at least once a month. ? During allergy season:  Keep windows closed as much as possible.  Plan outdoor activities when pollen counts are lowest. This is usually during the evening hours.  When coming indoors, change clothing and shower before sitting on furniture or bedding.  Take over-the-counter and prescription medicines only as told by your health care provider.  Keep all follow-up visits as told by your health care provider. This is important. Contact a health care provider if:  You have a fever.  You develop a persistent cough.  You make whistling sounds when you breathe (you wheeze).  Your symptoms interfere with your normal daily activities. Get help right away if:  You have shortness of breath. Summary  This condition can be managed by taking medicines as directed and avoiding allergens.  Contact your health care provider if you develop a persistent cough or fever.  During allergy season, keep windows closed as much as possible. This information is not intended to replace advice given to you by your health care provider. Make sure you discuss any questions you have with your health care provider. Document Released:  05/19/2001 Document Revised: 10/01/2016 Document Reviewed: 10/01/2016 Elsevier Interactive Patient Education  2018 [employer]. Viral Illness, Adult Viruses are tiny germs that can get into a person's body and cause illness. There are many different types of viruses, and they cause many types of illness. Viral illnesses can range from mild to severe. They can affect various parts of the body. Common illnesses that are caused by a virus include colds and the flu. Viral illnesses also include serious conditions such as HIV/AIDS (human immunodeficiency virus/acquired immunodeficiency syndrome). A few viruses have been linked to certain cancers. What are the causes? Many  types of viruses can cause illness. Viruses invade cells in your body, multiply, and cause the infected cells to malfunction or die. When the cell dies, it releases more of the virus. When this happens, you develop symptoms of the illness, and the virus continues to spread to other cells. If the virus takes over the function of the cell, it can cause the cell to divide and grow out of control, as is the case when a virus causes cancer. Different viruses get into the body in different ways. You can get a virus by:  Swallowing food or water that is contaminated with the virus.  Breathing in droplets that have been coughed or sneezed into the air by an infected person.  Touching a surface that has been contaminated with the virus and then touching your eyes, nose, or mouth.  Being bitten by an insect or animal that carries the virus.  Having sexual contact with a person who is infected with the virus.  Being exposed to blood or fluids that contain the virus, either through an open cut or during a transfusion.  If a virus enters your body, your body's defense system (immune system) will try to fight the virus. You may be at higher risk for a viral illness if your immune system is weak. What are the signs or symptoms? Symptoms vary  depending on the type of virus and the location of the cells that it invades. Common symptoms of the main types of viral illnesses include: Cold and flu viruses  Fever.  Headache.  Sore throat.  Muscle aches.  Nasal congestion.  Cough. Digestive system (gastrointestinal) viruses  Fever.  Abdominal pain.  Nausea.  Diarrhea. Liver viruses (hepatitis)  Loss of appetite.  Tiredness.  Yellowing of the skin (jaundice). Brain and spinal cord viruses  Fever.  Headache.  Stiff neck.  Nausea and vomiting.  Confusion or sleepiness. Skin viruses  Warts.  Itching.  Rash. Sexually transmitted viruses  Discharge.  Swelling.  Redness.  Rash. How is this treated? Viruses can be difficult to treat because they live within cells. Antibiotic medicines do not treat viruses because these drugs do not get inside cells. Treatment for a viral illness may include:  Resting and drinking plenty of fluids.  Medicines to relieve symptoms. These can include over-the-counter medicine for pain and fever, medicines for cough or congestion, and medicines to relieve diarrhea.  Antiviral medicines. These drugs are available only for certain types of viruses. They may help reduce flu symptoms if taken early. There are also many antiviral medicines for hepatitis and HIV/AIDS.  Some viral illnesses can be prevented with vaccinations. A common example is the flu shot. Follow these instructions at home: Medicines   Take over-the-counter and prescription medicines only as told by your health care provider.  If you were prescribed an antiviral medicine, take it as told by your health care provider. Do not stop taking the medicine even if you start to feel better.  Be aware of when antibiotics are needed and when they are not needed. Antibiotics do not treat viruses. If your health care provider thinks that you may have a bacterial infection as well as a viral infection, you may get an  antibiotic. ? Do not ask for an antibiotic prescription if you have been diagnosed with a viral illness. That will not make your illness go away faster. ? Frequently taking antibiotics when they are not needed can lead to antibiotic resistance. When this develops, the medicine no longer  works against the bacteria that it normally fights. General instructions  Drink enough fluids to keep your urine clear or pale yellow.  Rest as much as possible.  Return to your normal activities as told by your health care provider. Ask your health care provider what activities are safe for you.  Keep all follow-up visits as told by your health care provider. This is important. How is this prevented? Take these actions to reduce your risk of viral infection:  Eat a healthy diet and get enough rest.  Wash your hands often with soap and water. This is especially important when you are in public places. If soap and water are not available, use hand sanitizer.  Avoid close contact with friends and family who have a viral illness.  If you travel to areas where viral gastrointestinal infection is common, avoid drinking water or eating raw food.  Keep your immunizations up to date. Get a flu shot every year as told by your health care provider.  Do not share toothbrushes, nail clippers, razors, or needles with other people.  Always practice safe sex.  Contact a health care provider if:  You have symptoms of a viral illness that do not go away.  Your symptoms come back after going away.  Your symptoms get worse. Get help right away if:  You have trouble breathing.  You have a severe headache or a stiff neck.  You have severe vomiting or abdominal pain. This information is not intended to replace advice given to you by your health care provider. Make sure you discuss any questions you have with your health care provider. Document Released: 01/03/2016 Document Revised: 02/05/2016 Document Reviewed:  01/03/2016 Elsevier Interactive Patient Education  2018 [employer] Nonallergic Rhinitis  1. Nonallergic rhinitis is a term used by allergist to describe inflammation in the nose that is not due to an allergic source (i.e., pollens, mold, animal dander or dust mites). 2. Nonallergic rhinitis can mimic many of the symptoms caused by allergies.  This includes a runny nose ("rhinorrhea"), sneezing, congestion, ear fullness and post nasal drip. 3. These symptoms usually occur year round but can be made worse by many environmental factors including weather change, irritants such as cigarette smoke, fumes, perfumes, detergents and many others. These are not allergens, but are irritants, and do not cause the formation of antibodies like true allergens.  For this reason most people with nonallergic rhinitis have negative skin tests. 4. Unfortunately, we have a poor understanding of what causes nonallergic rhinitis but certain factors such as deviated septum, sinusitis and nasal polyps may contribute to the symptoms.  Due to the poor understanding of what causes nonallergic rhinitis it cannot be cured and our treatment is only symptomatic to control symptoms. 5. Important factors in the successful treatment of nonallergic rhinitis include avoidance of the irritants that cause symptoms; for example, people who continue to smoke may never improve.  Continuous therapy works better than intermittent.  This may mean taking medications once daily or 3-4 times a day. Helpful treatments include nasal saline lavage, nasal ipratropium (Atrovent), nasal steroids, and decongestants.  Pure antihistamines don't seem to work very well.  Immunotherapy (allergy shots) has no role in the treatment of nonallergic rhinitis.  Several medications may have to be tried before a good combination is found for you.  These medications, in general, are safe for long term use.  Tolerance may develop to the therapeutic effects of these  medications: Frequently changing between several helpful medications can  prevent this should it occur.Sinus Rinse What is a sinus rinse? A sinus rinse is a simple home treatment that is used to rinse your sinuses with a sterile mixture of salt and water (saline solution). Sinuses are air-filled spaces in your skull behind the bones of your face and forehead that open into your nasal cavity. You will use the following:  Saline solution.  Neti pot or spray bottle. This releases the saline solution into your nose and through your sinuses. Neti pots and spray bottles can be purchased at [employer], a health food store, or online.  When would I do a sinus rinse? A sinus rinse can help to clear mucus, dirt, dust, or pollen from the nasal cavity. You may do a sinus rinse when you have a cold, a virus, nasal allergy symptoms, a sinus infection, or stuffiness in the nose or sinuses. If you are considering a sinus rinse:  Ask your child's health care provider before performing a sinus rinse on your child.  Do not do a sinus rinse if you have had ear or nasal surgery, ear infection, or blocked ears.  How do I do a sinus rinse?  Wash your hands.  Disinfect your device according to the directions provided and then dry it.  Use the solution that comes with your device or one that is sold separately in stores. Follow the mixing directions on the package.  Fill your device with the amount of saline solution as directed by the device instructions.  Stand over a sink and tilt your head sideways over the sink.  Place the spout of the device in your upper nostril (the one closer to the ceiling).  Gently pour or squeeze the saline solution into the nasal cavity. The liquid should drain to the lower nostril if you are not overly congested.  Gently blow your nose. Blowing too hard may cause ear pain.  Repeat in the other nostril.  Clean and rinse your device with clean water and then air-dry  it. Are there risks of a sinus rinse? Sinus rinse is generally very safe and effective. However, there are a few risks, which include:  A burning sensation in the sinuses. This may happen if you do not make the saline solution as directed. Make sure to follow all directions when making the saline solution.  Infection from contaminated water. This is rare, but possible.  Nasal irritation.  This information is not intended to replace advice given to you by your health care provider. Make sure you discuss any questions you have with your health care provider. Document Released: 03/21/2014 Document Revised: 07/21/2016 Document Reviewed: 01/09/2014 Elsevier Interactive Patient Education  2017 [employer]. Otitis Media With Effusion, Pediatric Otitis media with effusion (OME) occurs when there is inflammation of the middle ear and fluid in the middle ear space. There are no signs and symptoms of infection. The middle ear space contains air and the bones for hearing. Air in the middle ear space helps to transmit sound to the brain. OME is a common condition in children, and it often occurs after an ear infection. This condition may be present for several weeks or longer after an ear infection. Most cases of this condition get better on their own. What are the causes? OME is caused by a blockage of the eustachian tube in one or both ears. These tubes drain fluid in the ears to the back of the nose (nasopharynx). If the tissue in the tube swells up (edema), the  tube closes. This prevents fluid from draining. Blockage can be caused by:  Ear infections.  Colds and other upper respiratory infections.  Allergies.  Irritants, such as tobacco smoke.  Enlarged adenoids. The adenoids are areas of soft tissue located high in the back of the throat, behind the nose and the roof of the mouth. They are part of the body's natural defense (immune) system.  A mass in the nasopharynx.  Damage to the ear  caused by pressure changes (barotrauma).  What increases the risk? Your child is more likely to develop this condition if:  He or she has repeated ear and sinus infections.  He or she has allergies.  He or she is exposed to tobacco smoke.  He or she attends daycare.  He or she is not breastfed.  What are the signs or symptoms? Symptoms of this condition may not be obvious. Sometimes this condition does not have any symptoms, or symptoms may overlap with those of a cold or upper respiratory tract illness. Symptoms of this condition include:  Temporary hearing loss.  A feeling of fullness in the ear without pain.  Irritability or agitation.  Balance (vestibular) problems.  As a result of hearing loss, your child may:  Listen to the TV at a loud volume.  Not respond to questions.  Ask "What?" often when spoken to.  Mistake or confuse one sound or word for another.  Perform poorly at school.  Have a poor attention span.  Become agitated or irritated easily.  How is this diagnosed? This condition is diagnosed with an ear exam. Your child's health care provider will look inside your child's ear with an instrument (otoscope) to check for redness, swelling, and fluid. Other tests may be done, including:  A test to check the movement of the eardrum (pneumatic otoscopy). This is done by squeezing a small amount of air into the ear.  A test that changes air pressure in the middle ear to check how well the eardrum moves and to see if the eustachian tube is working (tympanogram).  Hearing test (audiogram). This test involves playing tones at different pitches to see if your child can hear each tone.  How is this treated? Treatment for this condition depends on the cause. In many cases, the fluid goes away on its own. In some cases, your child may need a procedure to create a hole in the eardrum to allow fluid to drain (myringotomy) and to insert small drainage tubes  (tympanostomy tubes) into the eardrums. These tubes help to drain fluid and prevent infection. This procedure may be recommended if:  OME does not get better over several months.  Your child has many ear infections within several months.  Your child has noticeable hearing loss.  Your child has problems with speech and language development.  Surgery may also be done to remove the adenoids (adenoidectomy). Follow these instructions at home:  Give over-the-counter and prescription medicines only as told by your child's health care provider.  Keep children away from any tobacco smoke.  Keep all follow-up visits as told by your child's health care provider. This is important. How is this prevented?  Keep your child's vaccinations up to date. Make sure your child gets all recommended vaccinations, including a pneumonia and flu vaccine.  Encourage hand washing. Your child should wash his or her hands often with soap and water. If there is no soap and water, he or she should use hand sanitizer.  Avoid exposing your child to  tobacco smoke.  Breastfeed your baby, if possible. Babies who are breastfed as long as possible are less likely to develop this condition. Contact a health care provider if:  Your child's hearing does not get better after 3 months.  Your child's hearing is worse.  Your child has ear pain.  Your child has a fever.  Your child has drainage from the ear.  Your child is dizzy.  Your child has a lump on his or her neck. Get help right away if:  Your child has bleeding from the nose.  Your child cannot move part of her or his face.  Your child has trouble breathing.  Your child cannot smell.  Your child develops severe congestion.  Your child develops weakness.  Your child who is younger than 3 months has a temperature of 100F (38C) or higher. Summary  Otitis media with effusion (OME) occurs when there is inflammation of the middle ear and fluid in  the middle ear space.  This condition is caused by blockage of one or both eustachian tubes, which drain fluid in the ears to the back of the nose.  Symptoms of this condition can include temporary hearing loss, a feeling of fullness in the ear, irritability or agitation, and balance (vestibular) problems. Sometimes, there are no symptoms.  This condition is diagnosed with an ear exam and tests, such as pneumatic otoscopy, tympanogram, and audiogram.  Treatment for this condition depends on the cause. In many cases, the fluid goes away on its own. This information is not intended to replace advice given to you by your health care provider. Make sure you discuss any questions you have with your health care provider. Document Released: 11/14/2003 Document Revised: 07/16/2016 Document Reviewed: 07/16/2016 Elsevier Interactive Patient Education  2017 [employer]

## 2018-05-05 ENCOUNTER — Encounter: Payer: Self-pay | Admitting: Registered Nurse

## 2018-05-05 ENCOUNTER — Ambulatory Visit: Payer: Self-pay | Admitting: Registered Nurse

## 2018-05-05 VITALS — BP 124/94 | HR 62

## 2018-05-05 DIAGNOSIS — R030 Elevated blood-pressure reading, without diagnosis of hypertension: Secondary | ICD-10-CM

## 2018-05-05 DIAGNOSIS — B349 Viral infection, unspecified: Secondary | ICD-10-CM

## 2018-05-05 DIAGNOSIS — H6592 Unspecified nonsuppurative otitis media, left ear: Secondary | ICD-10-CM

## 2018-05-05 NOTE — Patient Instructions (Signed)
Viral Illness, Adult Viruses are tiny germs that can get into a person's body and cause illness. There are many different types of viruses, and they cause many types of illness. Viral illnesses can range from mild to severe. They can affect various parts of the body. Common illnesses that are caused by a virus include colds and the flu. Viral illnesses also include serious conditions such as HIV/AIDS (human immunodeficiency virus/acquired immunodeficiency syndrome). A few viruses have been linked to certain cancers. What are the causes? Many types of viruses can cause illness. Viruses invade cells in your body, multiply, and cause the infected cells to malfunction or die. When the cell dies, it releases more of the virus. When this happens, you develop symptoms of the illness, and the virus continues to spread to other cells. If the virus takes over the function of the cell, it can cause the cell to divide and grow out of control, as is the case when a virus causes cancer. Different viruses get into the body in different ways. You can get a virus by:  Swallowing food or water that is contaminated with the virus.  Breathing in droplets that have been coughed or sneezed into the air by an infected person.  Touching a surface that has been contaminated with the virus and then touching your eyes, nose, or mouth.  Being bitten by an insect or animal that carries the virus.  Having sexual contact with a person who is infected with the virus.  Being exposed to blood or fluids that contain the virus, either through an open cut or during a transfusion.  If a virus enters your body, your body's defense system (immune system) will try to fight the virus. You may be at higher risk for a viral illness if your immune system is weak. What are the signs or symptoms? Symptoms vary depending on the type of virus and the location of the cells that it invades. Common symptoms of the main types of viral illnesses  include: Cold and flu viruses  Fever.  Headache.  Sore throat.  Muscle aches.  Nasal congestion.  Cough. Digestive system (gastrointestinal) viruses  Fever.  Abdominal pain.  Nausea.  Diarrhea. Liver viruses (hepatitis)  Loss of appetite.  Tiredness.  Yellowing of the skin (jaundice). Brain and spinal cord viruses  Fever.  Headache.  Stiff neck.  Nausea and vomiting.  Confusion or sleepiness. Skin viruses  Warts.  Itching.  Rash. Sexually transmitted viruses  Discharge.  Swelling.  Redness.  Rash. How is this treated? Viruses can be difficult to treat because they live within cells. Antibiotic medicines do not treat viruses because these drugs do not get inside cells. Treatment for a viral illness may include:  Resting and drinking plenty of fluids.  Medicines to relieve symptoms. These can include over-the-counter medicine for pain and fever, medicines for cough or congestion, and medicines to relieve diarrhea.  Antiviral medicines. These drugs are available only for certain types of viruses. They may help reduce flu symptoms if taken early. There are also many antiviral medicines for hepatitis and HIV/AIDS.  Some viral illnesses can be prevented with vaccinations. A common example is the flu shot. Follow these instructions at home: Medicines   Take over-the-counter and prescription medicines only as told by your health care provider.  If you were prescribed an antiviral medicine, take it as told by your health care provider. Do not stop taking the medicine even if you start to feel better.  Be aware   of when antibiotics are needed and when they are not needed. Antibiotics do not treat viruses. If your health care provider thinks that you may have a bacterial infection as well as a viral infection, you may get an antibiotic. ? Do not ask for an antibiotic prescription if you have been diagnosed with a viral illness. That will not make your  illness go away faster. ? Frequently taking antibiotics when they are not needed can lead to antibiotic resistance. When this develops, the medicine no longer works against the bacteria that it normally fights. General instructions  Drink enough fluids to keep your urine clear or pale yellow.  Rest as much as possible.  Return to your normal activities as told by your health care provider. Ask your health care provider what activities are safe for you.  Keep all follow-up visits as told by your health care provider. This is important. How is this prevented? Take these actions to reduce your risk of viral infection:  Eat a healthy diet and get enough rest.  Wash your hands often with soap and water. This is especially important when you are in public places. If soap and water are not available, use hand sanitizer.  Avoid close contact with friends and family who have a viral illness.  If you travel to areas where viral gastrointestinal infection is common, avoid drinking water or eating raw food.  Keep your immunizations up to date. Get a flu shot every year as told by your health care provider.  Do not share toothbrushes, nail clippers, razors, or needles with other people.  Always practice safe sex.  Contact a health care provider if:  You have symptoms of a viral illness that do not go away.  Your symptoms come back after going away.  Your symptoms get worse. Get help right away if:  You have trouble breathing.  You have a severe headache or a stiff neck.  You have severe vomiting or abdominal pain. This information is not intended to replace advice given to you by your health care provider. Make sure you discuss any questions you have with your health care provider. Document Released: 01/03/2016 Document Revised: 02/05/2016 Document Reviewed: 01/03/2016 Elsevier Interactive Patient Education  2018 [employer] Sinus Rinse What is a sinus rinse? A sinus rinse is a  simple home treatment that is used to rinse your sinuses with a sterile mixture of salt and water (saline solution). Sinuses are air-filled spaces in your skull behind the bones of your face and forehead that open into your nasal cavity. You will use the following:  Saline solution.  Neti pot or spray bottle. This releases the saline solution into your nose and through your sinuses. Neti pots and spray bottles can be purchased at [employer], a health food store, or online.  When would I do a sinus rinse? A sinus rinse can help to clear mucus, dirt, dust, or pollen from the nasal cavity. You may do a sinus rinse when you have a cold, a virus, nasal allergy symptoms, a sinus infection, or stuffiness in the nose or sinuses. If you are considering a sinus rinse:  Ask your child's health care provider before performing a sinus rinse on your child.  Do not do a sinus rinse if you have had ear or nasal surgery, ear infection, or blocked ears.  How do I do a sinus rinse?  Wash your hands.  Disinfect your device according to the directions provided and then dry it.  Use the solution that comes with your device or one that is sold separately in stores. Follow the mixing directions on the package.  Fill your device with the amount of saline solution as directed by the device instructions.  Stand over a sink and tilt your head sideways over the sink.  Place the spout of the device in your upper nostril (the one closer to the ceiling).  Gently pour or squeeze the saline solution into the nasal cavity. The liquid should drain to the lower nostril if you are not overly congested.  Gently blow your nose. Blowing too hard may cause ear pain.  Repeat in the other nostril.  Clean and rinse your device with clean water and then air-dry it. Are there risks of a sinus rinse? Sinus rinse is generally very safe and effective. However, there are a few risks, which include:  A burning sensation  in the sinuses. This may happen if you do not make the saline solution as directed. Make sure to follow all directions when making the saline solution.  Infection from contaminated water. This is rare, but possible.  Nasal irritation.  This information is not intended to replace advice given to you by your health care provider. Make sure you discuss any questions you have with your health care provider. Document Released: 03/21/2014 Document Revised: 07/21/2016 Document Reviewed: 01/09/2014 Elsevier Interactive Patient Education  2017 [employer] Sinusitis, Adult Sinusitis is soreness and inflammation of your sinuses. Sinuses are hollow spaces in the bones around your face. Your sinuses are located:  Around your eyes.  In the middle of your forehead.  Behind your nose.  In your cheekbones.  Your sinuses and nasal passages are lined with a stringy fluid (mucus). Mucus normally drains out of your sinuses. When your nasal tissues become inflamed or swollen, the mucus can become trapped or blocked so air cannot flow through your sinuses. This allows bacteria, viruses, and funguses to grow, which leads to infection. Sinusitis can develop quickly and last for 7?10 days (acute) or for more than 12 weeks (chronic). Sinusitis often develops after a cold. What are the causes? This condition is caused by anything that creates swelling in the sinuses or stops mucus from draining, including:  Allergies.  Asthma.  Bacterial or viral infection.  Abnormally shaped bones between the nasal passages.  Nasal growths that contain mucus (nasal polyps).  Narrow sinus openings.  Pollutants, such as chemicals or irritants in the air.  A foreign object stuck in the nose.  A fungal infection. This is rare.  What increases the risk? The following factors may make you more likely to develop this condition:  Having allergies or asthma.  Having had a recent cold or respiratory tract  infection.  Having structural deformities or blockages in your nose or sinuses.  Having a weak immune system.  Doing a lot of swimming or diving.  Overusing nasal sprays.  Smoking.  What are the signs or symptoms? The main symptoms of this condition are pain and a feeling of pressure around the affected sinuses. Other symptoms include:  Upper toothache.  Earache.  Headache.  Bad breath.  Decreased sense of smell and taste.  A cough that may get worse at night.  Fatigue.  Fever.  Thick drainage from your nose. The drainage is often green and it may contain pus (purulent).  Stuffy nose or congestion.  Postnasal drip. This is when extra mucus collects in the throat or back of the nose.  Swelling and  warmth over the affected sinuses.  Sore throat.  Sensitivity to light.  How is this diagnosed? This condition is diagnosed based on symptoms, a medical history, and a physical exam. To find out if your condition is acute or chronic, your health care provider may:  Look in your nose for signs of nasal polyps.  Tap over the affected sinus to check for signs of infection.  View the inside of your sinuses using an imaging device that has a light attached (endoscope).  If your health care provider suspects that you have chronic sinusitis, you may also:  Be tested for allergies.  Have a sample of mucus taken from your nose (nasal culture) and checked for bacteria.  Have a mucus sample examined to see if your sinusitis is related to an allergy.  If your sinusitis does not respond to treatment and it lasts longer than 8 weeks, you may have an MRI or CT scan to check your sinuses. These scans also help to determine how severe your infection is. In rare cases, a bone biopsy may be done to rule out more serious types of fungal sinus disease. How is this treated? Treatment for sinusitis depends on the cause and whether your condition is chronic or acute. If a virus is  causing your sinusitis, your symptoms will go away on their own within 10 days. You may be given medicines to relieve your symptoms, including:  Topical nasal decongestants. They shrink swollen nasal passages and let mucus drain from your sinuses.  Antihistamines. These drugs block inflammation that is triggered by allergies. This can help to ease swelling in your nose and sinuses.  Topical nasal corticosteroids. These are nasal sprays that ease inflammation and swelling in your nose and sinuses.  Nasal saline washes. These rinses can help to get rid of thick mucus in your nose.  If your condition is caused by bacteria, you will be given an antibiotic medicine. If your condition is caused by a fungus, you will be given an antifungal medicine. Surgery may be needed to correct underlying conditions, such as narrow nasal passages. Surgery may also be needed to remove polyps. Follow these instructions at home: Medicines  Take, use, or apply over-the-counter and prescription medicines only as told by your health care provider. These may include nasal sprays.  If you were prescribed an antibiotic medicine, take it as told by your health care provider. Do not stop taking the antibiotic even if you start to feel better. Hydrate and Humidify  Drink enough water to keep your urine clear or pale yellow. Staying hydrated will help to thin your mucus.  Use a cool mist humidifier to keep the humidity level in your home above 50%.  Inhale steam for 10-15 minutes, 3-4 times a day or as told by your health care provider. You can do this in the bathroom while a hot shower is running.  Limit your exposure to cool or dry air. Rest  Rest as much as possible.  Sleep with your head raised (elevated).  Make sure to get enough sleep each night. General instructions  Apply a warm, moist washcloth to your face 3-4 times a day or as told by your health care provider. This will help with discomfort.  Wash  your hands often with soap and water to reduce your exposure to viruses and other germs. If soap and water are not available, use hand sanitizer.  Do not smoke. Avoid being around people who are smoking (secondhand smoke).  Keep all  follow-up visits as told by your health care provider. This is important. Contact a health care provider if:  You have a fever.  Your symptoms get worse.  Your symptoms do not improve within 10 days. Get help right away if:  You have a severe headache.  You have persistent vomiting.  You have pain or swelling around your face or eyes.  You have vision problems.  You develop confusion.  Your neck is stiff.  You have trouble breathing. This information is not intended to replace advice given to you by your health care provider. Make sure you discuss any questions you have with your health care provider. Document Released: 08/24/2005 Document Revised: 04/19/2016 Document Reviewed: 06/19/2015 Elsevier Interactive Patient Education  2018 [employer] Otitis Media, Adult Otitis media occurs when there is inflammation and fluid in the middle ear. Your middle ear is a part of the ear that contains bones for hearing as well as air that helps send sounds to your brain. What are the causes? This condition is caused by a blockage in the eustachian tube. This tube drains fluid from the ear to the back of the nose (nasopharynx). A blockage in this tube can be caused by an object or by swelling (edema) in the tube. Problems that can cause a blockage include:  A cold or other upper respiratory infection.  Allergies.  An irritant, such as tobacco smoke.  Enlarged adenoids. The adenoids are areas of soft tissue located high in the back of the throat, behind the nose and the roof of the mouth.  A mass in the nasopharynx.  Damage to the ear caused by pressure changes (barotrauma).  What are the signs or symptoms? Symptoms of this condition include:  Ear  pain.  A fever.  Decreased hearing.  A headache.  Tiredness (lethargy).  Fluid leaking from the ear.  Ringing in the ear.  How is this diagnosed? This condition is diagnosed with a physical exam. During the exam your health care provider will use an instrument called an otoscope to look into your ear and check for redness, swelling, and fluid. He or she will also ask about your symptoms. Your health care provider may also order tests, such as:  A test to check the movement of the eardrum (pneumatic otoscopy). This test is done by squeezing a small amount of air into the ear.  A test that changes air pressure in the middle ear to check how well the eardrum moves and whether the eustachian tube is working (tympanogram).  How is this treated? This condition usually goes away on its own within 3-5 days. But if the condition is caused by a bacteria infection and does not go away own its own, or keeps coming back, your health care provider may:  Prescribe antibiotic medicines to treat the infection.  Prescribe or recommend medicines to control pain.  Follow these instructions at home:  Take over-the-counter and prescription medicines only as told by your health care provider.  If you were prescribed an antibiotic medicine, take it as told by your health care provider. Do not stop taking the antibiotic even if you start to feel better.  Keep all follow-up visits as told by your health care provider. This is important. Contact a health care provider if:  You have bleeding from your nose.  There is a lump on your neck.  You are not getting better in 5 days.  You feel worse instead of better. Get help right away if:  You have severe pain that is not controlled with medicine.  You have swelling, redness, or pain around your ear.  You have stiffness in your neck.  A part of your face is paralyzed.  The bone behind your ear (mastoid) is tender when you touch it.  You  develop a severe headache. Summary  Otitis media is redness, soreness, and swelling of the middle ear.  This condition usually goes away on its own within 3-5 days.  If the problem does not go away in 3-5 days, your health care provider may prescribe or recommend medicines to treat your symptoms.  If you were prescribed an antibiotic medicine, take it as told by your health care provider. This information is not intended to replace advice given to you by your health care provider. Make sure you discuss any questions you have with your health care provider. Document Released: 05/29/2004 Document Revised: 08/14/2016 Document Reviewed: 08/14/2016 Elsevier Interactive Patient Education  [employer].

## 2018-05-05 NOTE — Progress Notes (Signed)
Subjective:    Patient ID: Olivia Hamilton, female    DOB: [birth year], 52 y.o.   MRN: [id number]  51y/o female pt presenting for f/u from 05/03/18 OV for L OM and rhinosinusitis. A lot of sx have continued but are overall improved. Otalgia is more discomfort now bilaterally. Frontal sinus pain resolved. Maxillary sinus pressure. Sore throat continues. Feels as though everything is draining better and not just clogged up. Feels worse when lying flat and at night. Using nasal saline spray. Has not been to pharmacy yet for any Rx's or OTCs. Just using Tylenol and saline spray. Planning to go to pharmacy today to pick everything up. Forgot saline at home today; didn't pick up flonase yet; hasn't required albuterol cough controlled with cough lozenges at work.     Review of Systems  Constitutional: Positive for fatigue. Negative for activity change, appetite change, chills, diaphoresis, fever and unexpected weight change.  HENT: Positive for congestion, ear pain, postnasal drip, rhinorrhea, sinus pressure and sinus pain. Negative for dental problem, drooling, ear discharge, facial swelling, hearing loss, mouth sores, nosebleeds, sneezing, sore throat, tinnitus, trouble swallowing and voice change.   Eyes: Negative for photophobia, pain, discharge, redness, itching and visual disturbance.  Respiratory: Positive for cough. Negative for choking, chest tightness, shortness of breath, wheezing and stridor.   Cardiovascular: Negative for chest pain, palpitations and leg swelling.  Gastrointestinal: Negative for abdominal distention, abdominal pain, blood in stool, diarrhea, nausea and vomiting.  Endocrine: Negative for cold intolerance and heat intolerance.  Genitourinary: Negative for difficulty urinating, dysuria and hematuria.  Musculoskeletal: Negative for arthralgias, back pain, gait problem, joint swelling, myalgias, neck pain and neck stiffness.  Skin: Negative for color change, pallor, rash and  wound.  Allergic/Immunologic: Positive for environmental allergies. Negative for food allergies.  Neurological: Positive for headaches. Negative for dizziness, tremors, seizures, syncope, facial asymmetry, speech difficulty, weakness, light-headedness and numbness.  Hematological: Negative for adenopathy. Does not bruise/bleed easily.  Psychiatric/Behavioral: Negative for agitation, behavioral problems, confusion and sleep disturbance.       Objective:   Physical Exam  Constitutional: She is oriented to person, place, and time. She appears well-developed and well-nourished. She is active and cooperative.  Non-toxic appearance. She does not have a sickly appearance. She appears ill. No distress.  HENT:  Head: Normocephalic and atraumatic.  Right Ear: Hearing, external ear and ear canal normal. A middle ear effusion is present.  Left Ear: Hearing, external ear and ear canal normal. Tympanic membrane is erythematous and bulging. A middle ear effusion is present.  Nose: Mucosal edema and rhinorrhea present. No nose lacerations, sinus tenderness, nasal deformity, septal deviation or nasal septal hematoma. No epistaxis.  No foreign bodies. Right sinus exhibits maxillary sinus tenderness. Right sinus exhibits no frontal sinus tenderness. Left sinus exhibits maxillary sinus tenderness. Left sinus exhibits no frontal sinus tenderness.  Mouth/Throat: Uvula is midline and mucous membranes are normal. Mucous membranes are not pale, not dry and not cyanotic. She does not have dentures. No oral lesions. No trismus in the jaw. Normal dentition. No dental abscesses, uvula swelling, lacerations or dental caries. Posterior oropharyngeal edema and posterior oropharyngeal erythema present. No oropharyngeal exudate or tonsillar abscesses. No tonsillar exudate.  Left TM bulging with erythema 60% throughout and opacity air fluid level; right TM air fluid level clear; cobhlestoning posterior pharynx; bilateral maxillary  sinuses right greater than left TTP; bilateral allergic shiners; clear discharge bilateral nasal turbinates edema/erythema; frequent nose clearing in exam room  Eyes: Pupils  are equal, round, and reactive to light. Conjunctivae, EOM and lids are normal. Right eye exhibits no chemosis, no discharge, no exudate and no hordeolum. No foreign body present in the right eye. Left eye exhibits no chemosis, no discharge, no exudate and no hordeolum. No foreign body present in the left eye. Right conjunctiva is not injected. Right conjunctiva has no hemorrhage. Left conjunctiva is not injected. Left conjunctiva has no hemorrhage. No scleral icterus. Right eye exhibits normal extraocular motion and no nystagmus. Left eye exhibits normal extraocular motion and no nystagmus. Right pupil is round and reactive. Left pupil is round and reactive. Pupils are equal.  Neck: Trachea normal, normal range of motion and phonation normal. Neck supple. No tracheal tenderness, no spinous process tenderness and no muscular tenderness present. No neck rigidity. No tracheal deviation, no edema, no erythema and normal range of motion present. No thyroid mass and no thyromegaly present.  Cardiovascular: Normal rate, regular rhythm, S1 normal, S2 normal, normal heart sounds and intact distal pulses. PMI is not displaced. Exam reveals no gallop and no friction rub.  No murmur heard. Pulmonary/Chest: Effort normal and breath sounds normal. No accessory muscle usage or stridor. No respiratory distress. She has no decreased breath sounds. She has no wheezes. She has no rhonchi. She has no rales. She exhibits no tenderness.  No cough observed in exam room; spoke full sentences without difficulty  Abdominal: Soft. Normal appearance. She exhibits no distension, no fluid wave and no ascites. There is no rigidity and no guarding.  Musculoskeletal: Normal range of motion. She exhibits no edema or tenderness.       Right shoulder: Normal.       Left  shoulder: Normal.       Right elbow: Normal.      Left elbow: Normal.       Right hip: Normal.       Left hip: Normal.       Right knee: Normal.       Left knee: Normal.       Cervical back: Normal.       Thoracic back: Normal.       Lumbar back: Normal.       Right hand: Normal.       Left hand: Normal.  Lymphadenopathy:       Head (right side): No submental, no submandibular, no tonsillar, no preauricular, no posterior auricular and no occipital adenopathy present.       Head (left side): No submental, no submandibular, no tonsillar, no preauricular, no posterior auricular and no occipital adenopathy present.    She has no cervical adenopathy.       Right cervical: No superficial cervical, no deep cervical and no posterior cervical adenopathy present.      Left cervical: No superficial cervical, no deep cervical and no posterior cervical adenopathy present.  Neurological: She is alert and oriented to person, place, and time. She has normal strength. She is not disoriented. She displays no atrophy and no tremor. No cranial nerve deficit or sensory deficit. She exhibits normal muscle tone. She displays no seizure activity. Coordination and gait normal. GCS eye subscore is 4. GCS verbal subscore is 5. GCS motor subscore is 6.  Skin: Skin is warm, dry and intact. Capillary refill takes less than 2 seconds. No abrasion, no bruising, no burn, no ecchymosis, no laceration, no lesion, no petechiae and no rash noted. She is not diaphoretic. No cyanosis or erythema. No pallor. Nails show no clubbing.  Psychiatric: She has a normal mood and affect. Her speech is normal and behavior is normal. Judgment and thought content normal. She is not actively hallucinating. Cognition and memory are normal. She is attentive.  Nursing note and vitals reviewed.         Assessment & Plan:  A-acute maxillary sinusitis and left otitis media nonsupportive, elevated blood pressure  P-azithromycin 250mg  sig t2 po  today then t1 po daily days 2-5 #6 RF0 dispensed from [facility].  Tylenol 1000mg  po QID prn pain.  Supportive treatment.   No evidence of invasive bacterial infection, non toxic and well hydrated.  This is most likely self limiting viral infection.  I do not see where any further testing or imaging is necessary at this time.   I will suggest supportive care, rest, good hygiene and encourage the patient to take adequate fluids.  The patient is to return to clinic or EMERGENCY ROOM if symptoms worsen or change significantly e.g. ear pain, fever, purulent discharge from ears or bleeding.  Exitcare handout on otitis media .  Patient verbalized agreement and understanding of treatment plan.    azithromycin 250mg  sig t2 po today then t1 po daily days 2-5 #6 RF0 dispensed from [facility].  Tylenol 1000mg  po QID prn pain. Restart flonase 1 spray each nostril BID, saline 2 sprays each nostril q2h wa prn congestion. Increase frequency of nasal saline given another bottle for use at work as forgot her bottle at home from Tuesday 48 hours ago.  Discussed if no repetitive cough wheeze do not need to pick up albuterol from pharmacy [facility]. May continue cough drops po q2h prn cough.  Consider honey with lemon and hydrate. Denied personal or family history of ENT cancer.  Shower BID especially prior to bed. No evidence of systemic bacterial infection, non toxic and well hydrated.  I do not see where any further testing or imaging is necessary at this time.   I will suggest supportive care, rest, good hygiene and encourage the patient to take adequate fluids.  The patient is to return to clinic or EMERGENCY ROOM if symptoms worsen or change significantly.  Exitcare handout on sinusitis and sinus rinse.  Patient verbalized agreement and understanding of treatment plan and had no further questions at this time.   P2:  Hand washing and cover cough  Acute illness on OTC medications for cough and cold repeat blood pressure with RN Rolly SalterHaley when  feeling better.

## 2018-09-29 ENCOUNTER — Ambulatory Visit: Payer: Self-pay | Admitting: Registered Nurse

## 2018-09-29 ENCOUNTER — Encounter: Payer: Self-pay | Admitting: Registered Nurse

## 2018-09-29 VITALS — BP 142/86 | HR 76 | Temp 98.1°F

## 2018-09-29 DIAGNOSIS — S0081XA Abrasion of other part of head, initial encounter: Secondary | ICD-10-CM

## 2018-09-29 DIAGNOSIS — J206 Acute bronchitis due to rhinovirus: Secondary | ICD-10-CM

## 2018-09-29 DIAGNOSIS — J0101 Acute recurrent maxillary sinusitis: Secondary | ICD-10-CM

## 2018-09-29 MED ORDER — PSEUDOEPH-BROMPHEN-DM 30-2-10 MG/5ML PO SYRP: 5.0000 mL | ORAL_SOLUTION | Freq: Four times a day (QID) | ORAL | 0 refills | Status: AC | PRN

## 2018-09-29 MED ORDER — ACETAMINOPHEN 500 MG PO TABS: 1000.0000 mg | ORAL_TABLET | Freq: Four times a day (QID) | ORAL | 0 refills | Status: AC | PRN

## 2018-09-29 MED ORDER — ALBUTEROL SULFATE 108 (90 BASE) MCG/ACT IN AEPB: 1.0000 | INHALATION_SPRAY | RESPIRATORY_TRACT | 0 refills | Status: DC | PRN

## 2018-09-29 MED ORDER — AZITHROMYCIN 250 MG PO TABS: ORAL_TABLET | ORAL | 0 refills | Status: DC

## 2018-09-29 MED ORDER — SALINE SPRAY 0.65 % NA SOLN: 2.0000 | NASAL | 0 refills | Status: DC

## 2018-09-29 NOTE — Progress Notes (Signed)
Subjective:    Patient ID: Olivia Hamilton, female    DOB: 04/18/1966, 53 y.o.   MRN: [id number]  52y/o Caucasian female pt c/o productive cough, chest congestion, maxillary and frontal sinus pressure, rhinorrhea, chest heaviness, fatigue, bilateral ear fullness/pressure. Sx x7 days. Recent sick contacts (grandkidsx3 under 2yo with URI). Chills at home a few days but have now resolved. Taking Theraflu cough and cold during the day (Phenylephrine, Dextromethorphan, acetaminophen) and Nyquil at night. No doses for past 1.5 days. Started feeling worse today.  Has an albuterol inhaler from Aug 2019 that she didn't need. Has it with her today. Has not used it any this time. Wondering if she should start.  + sick contacts at work also viral URI     Review of Systems  Constitutional: Positive for fatigue. Negative for activity change, appetite change, chills, diaphoresis, fever and unexpected weight change.  HENT: Positive for congestion, ear pain, postnasal drip, rhinorrhea, sinus pressure, sinus pain and sore throat. Negative for dental problem, drooling, ear discharge, facial swelling, hearing loss, mouth sores, nosebleeds, sneezing, tinnitus, trouble swallowing and voice change.   Eyes: Negative for photophobia, pain, discharge, redness, itching and visual disturbance.  Respiratory: Positive for cough and wheezing. Negative for choking, chest tightness, shortness of breath and stridor.   Cardiovascular: Negative for chest pain, palpitations and leg swelling.  Gastrointestinal: Negative for abdominal distention, abdominal pain, blood in stool, constipation, diarrhea, nausea and vomiting.  Endocrine: Negative for cold intolerance and heat intolerance.  Genitourinary: Negative for difficulty urinating, dysuria and hematuria.  Musculoskeletal: Negative for arthralgias, back pain, gait problem, joint swelling, myalgias, neck pain and neck stiffness.  Skin: Negative for color change, pallor, rash and  wound.  Allergic/Immunologic: Positive for environmental allergies. Negative for food allergies.  Neurological: Positive for headaches. Negative for dizziness, tremors, seizures, syncope, facial asymmetry, speech difficulty, weakness, light-headedness and numbness.  Hematological: Negative for adenopathy. Does not bruise/bleed easily.  Psychiatric/Behavioral: Positive for sleep disturbance. Negative for agitation, behavioral problems and confusion.       Objective:   Physical Exam Vitals signs and nursing note reviewed.  Constitutional:      General: She is not in acute distress.    Appearance: She is well-developed and well-groomed. She is ill-appearing. She is not toxic-appearing or diaphoretic.  HENT:     Head: Normocephalic and atraumatic.     Jaw: There is normal jaw occlusion. No trismus, swelling, pain on movement or malocclusion.     Right Ear: Hearing, ear canal and external ear normal. A middle ear effusion is present.     Left Ear: Hearing, ear canal and external ear normal. A middle ear effusion is present.     Nose: Mucosal edema, congestion and rhinorrhea present. No nasal deformity, septal deviation or laceration.     Right Turbinates: Enlarged and swollen. Not pale.     Left Turbinates: Enlarged and swollen. Not pale.     Right Sinus: Maxillary sinus tenderness and frontal sinus tenderness present.     Left Sinus: Maxillary sinus tenderness and frontal sinus tenderness present.     Mouth/Throat:     Lips: Pink. No lesions.     Mouth: Mucous membranes are moist. Mucous membranes are not pale, not dry and not cyanotic. No lacerations or oral lesions.     Dentition: Normal dentition. Does not have dentures. No dental caries or dental abscesses.     Pharynx: Uvula midline. Pharyngeal swelling and posterior oropharyngeal erythema present. No oropharyngeal exudate or  uvula swelling.     Tonsils: No tonsillar exudate or tonsillar abscesses. Swelling: 0 on the right. 0 on the  left.  Eyes:     General: Lids are normal. Allergic shiner present. No visual field deficit or scleral icterus.       Right eye: No foreign body, discharge or hordeolum.        Left eye: No foreign body, discharge or hordeolum.     Extraocular Movements: Extraocular movements intact.     Right eye: Normal extraocular motion and no nystagmus.     Left eye: Normal extraocular motion and no nystagmus.     Conjunctiva/sclera: Conjunctivae normal.     Right eye: Right conjunctiva is not injected. No chemosis, exudate or hemorrhage.    Left eye: Left conjunctiva is not injected. No chemosis, exudate or hemorrhage.    Pupils: Pupils are equal, round, and reactive to light. Pupils are equal.     Right eye: Pupil is round and reactive.     Left eye: Pupil is round and reactive.  Neck:     Musculoskeletal: Normal range of motion and neck supple. Normal range of motion. No edema, erythema, neck rigidity, spinous process tenderness or muscular tenderness.     Thyroid: No thyroid mass or thyromegaly.     Trachea: Trachea and phonation normal. No tracheal tenderness or tracheal deviation.  Cardiovascular:     Rate and Rhythm: Normal rate and regular rhythm.     Chest Wall: PMI is not displaced.     Pulses:          Radial pulses are 2+ on the right side and 2+ on the left side.     Heart sounds: Normal heart sounds, S1 normal and S2 normal. No murmur. No friction rub. No gallop.   Pulmonary:     Effort: Pulmonary effort is normal. No accessory muscle usage or respiratory distress.     Breath sounds: Decreased air movement present. No stridor or transmitted upper airway sounds. Examination of the right-lower field reveals wheezing. Examination of the left-lower field reveals wheezing. Wheezing present. No decreased breath sounds, rhonchi or rales.     Comments: Intermittent frequent nonproductive cough in exam room; spoke full sentences without difficulty; demonstrated respiclick use in exam room for  patient and patient inhaled 2 puffs with good technique Chest:     Chest wall: No tenderness.  Abdominal:     General: There is no distension.     Palpations: Abdomen is soft.  Musculoskeletal: Normal range of motion.        General: No tenderness.     Right shoulder: Normal.     Left shoulder: Normal.     Right elbow: Normal.    Left elbow: Normal.     Right hip: Normal.     Left hip: Normal.     Right knee: Normal.     Left knee: Normal.     Cervical back: Normal.     Thoracic back: Normal.     Lumbar back: Normal.     Right hand: Normal.     Left hand: Normal.     Right lower leg: No edema.     Left lower leg: No edema.  Lymphadenopathy:     Head:     Right side of head: No submental, submandibular, tonsillar, preauricular, posterior auricular or occipital adenopathy.     Left side of head: No submental, submandibular, tonsillar, preauricular, posterior auricular or occipital adenopathy.     Cervical:  No cervical adenopathy.     Right cervical: No superficial, deep or posterior cervical adenopathy.    Left cervical: No superficial, deep or posterior cervical adenopathy.  Skin:    General: Skin is warm and dry.     Capillary Refill: Capillary refill takes less than 2 seconds.     Coloration: Skin is not ashen, cyanotic, jaundiced, mottled, pale or sallow.     Findings: Abrasion, erythema and rash present. No abscess, acne, bruising, burn, ecchymosis, laceration, lesion or petechiae. Rash is macular, papular and scaling. Rash is not crusting, nodular, purpuric, pustular, urticarial or vesicular.     Nails: There is no clubbing.           Comments: Grouped macular papular rash bilateral cheeks/distal nose/upper lip dry fine scale no bleeding/crusting noted  Neurological:     General: No focal deficit present.     Mental Status: She is alert and oriented to person, place, and time. Mental status is at baseline. She is not disoriented.     GCS: GCS eye subscore is 4. GCS  verbal subscore is 5. GCS motor subscore is 6.     Cranial Nerves: Cranial nerves are intact. No cranial nerve deficit, dysarthria or facial asymmetry.     Sensory: Sensation is intact. No sensory deficit.     Motor: Motor function is intact. No weakness, tremor, atrophy, abnormal muscle tone or seizure activity.     Coordination: Coordination is intact. Coordination normal.     Gait: Gait is intact. Gait normal.     Comments: Gait sure and steady in hallway  In/out of chair and on/off exam table without difficulty  Psychiatric:        Attention and Perception: Attention and perception normal.        Mood and Affect: Mood and affect normal.        Speech: Speech normal.        Behavior: Behavior normal. Behavior is cooperative.        Thought Content: Thought content normal.        Judgment: Judgment normal.      Abrasion cheeks/nose erythema macular/papular Clear discharge bilateral nares Frequent nasal sniffing while in exam room Bilateral TMs air fluid level clear, bilateral allergic shiners; nonproductive cough frequent;      Assessment & Plan:  A-acute sinusitis/bronchitis  P-Azithromycin 250mg  sig take 2 po today then take 1 tab po daily days 2-5 #6 RF0 dispensed from [facility] to patient.  Restart flonase 1 spray each nostril BID, saline 2 sprays each nostril q2h wa prn congestion. Denied personal or family history of ENT cancer.  Shower BID especially prior to bed. No evidence of systemic bacterial infection, non toxic and well hydrated.  I do not see where any further testing or imaging is necessary at this time.   I will suggest supportive care, rest, good hygiene and encourage the patient to take adequate fluids.  The patient is to return to clinic or EMERGENCY ROOM if symptoms worsen or change significantly.  Exitcare handout on sinusitis and sinus rinse.  Patient verbalized agreement and understanding of treatment plan and had no further questions at this time.   P2:  Hand washing  and cover cough  Requested bromphed refill worked well with last URI.  10ml po q4h prn max 40ml per 24 hours electronic Rx to her pharmacy of choice #240 RF0.  Has not used prednisone in the past.  Will hold off at this time but if no improvement  with ventolin and treatment sinusitis follow up in clinic for re-eval after 48 hours of azithromycin.   Cough lozenges po q2h prn cough  Albuterol MDI 90mcg 1-2 puffs po q4-6h prn protracted cough/wheeze possible side effect increased heart rate/tremor. Bronchitis simple, community acquired, may have started as viral (probably respiratory syncytial, parainfluenza, influenza, or adenovirus), but now evidence of acute purulent bronchitis with resultant bronchial edema and mucus formation.  Viruses are the most common cause of bronchial inflammation in otherwise healthy adults with acute bronchitis.  The appearance of sputum is not predictive of whether a bacterial infection is present.  Purulent sputum is most often caused by viral infections.  There are a small portion of those caused by non-viral agents being Mycoplama pneumonia.  Microscopic examination or C&S of sputum in the healthy adult with acute bronchitis is generally not helpful (usually negative or normal respiratory flora) other considerations being cough from upper respiratory tract infections, sinusitis or allergic syndromes (mild asthma or viral pneumonia).  Differential Diagnoses:  reactive airway disease (asthma, allergic aspergillosis (eosinophilia), chronic bronchitis, respiratory infection (sinusitis, common cold, pneumonia), congestive heart failure, reflux esophagitis, bronchogenic tumor, aspiration syndromes and/or exposure to pulmonary irritants/smoke.   Without high fever, severe dyspnea, lack of physical findings or other risk factors, I will hold on a chest radiograph and CBC at this time.  I discussed that approximately 50% of patients with acute bronchitis have a cough that lasts up to three  weeks, and 25% for over a month.  Tylenol 500mg  one to two tablets every four to six hours as needed for fever or myalgias.  No aspirin. Exitcare handout on cough, bronchitis and inhaler use ER if hemopthysis, SOB, worst chest pain of life.   Patient instructed to follow up in one week or sooner if symptoms worsen.  Patient verbalized agreement and understanding of treatment plan.  P2:  hand washing and cover coughVentolin   respiclick brought in from home had Rx from 2019 did not open unsure on how to use.  Reviewed manufacturer instructions with patient and had her demonstrated use in exam room 2 puffs. Discussed use on regular schedule q5h the next couple of days and then change to prn use. Discussed viral illness circulating in community typically rhinitis, cough, fatigue, low grade fever 2 weeks.  Flu also circulating body aches, fever greater than 100.5 typically with sore throat, rhinitis, cough notify me if fever occurs.  Discussed apply vaseline to abraded facial skin and try not to rub nose/lip/cheeks excessively with tissues.  If crusting/increased redness/pain/purulent discharge notify me as skin infection may have occurred.  Wash with soap gentle for sensitive skin no fragrance daily and apply facial moisturizer BID.  Exitcare abrasion.  Patient verbalized understanding information/instructions, agreed with plan of care and had no further questions at this time.

## 2018-09-29 NOTE — Patient Instructions (Addendum)
How to Perform a Sinus Rinse A sinus rinse is a home treatment that is used to rinse your sinuses with a sterile mixture of salt and water (saline solution). Sinuses are air-filled spaces in your skull behind the bones of your face and forehead that open into your nasal cavity. A sinus rinse can help to clear mucus, dirt, dust, or pollen from your nasal cavity. You may do a sinus rinse when you have a cold, a virus, nasal allergy symptoms, a sinus infection, or stuffiness in your nose or sinuses. Talk with your health care provider about whether a sinus rinse might help you. What are the risks? A sinus rinse is generally safe and effective. However, there are a few risks, which include:  A burning sensation in your sinuses. This may happen if you do not make the saline solution as directed. Be sure to follow all directions when making the saline solution.  Nasal irritation.  Infection from contaminated water. This is rare, but possible. Do not do a sinus rinse if you have had ear or nasal surgery, ear infection, or blocked ears. Supplies needed:  Saline solution or powder.  Distilled or sterile water may be needed to mix with saline powder. ? You may use boiled and cooled tap water. Boil tap water for 5 minutes; cool until it is lukewarm. Use within 24 hours. ? Do not use regular tap water to mix with the saline solution.  Neti pot or nasal rinse bottle. These supplies release the saline solution into your nose and through your sinuses. Neti pots and nasal rinse bottles can be purchased at [employer], a health food store, or online. How to perform a sinus rinse  1. Wash your hands with soap and water. 2. Wash your device according to the directions that came with the product and then dry it. 3. Use the solution that comes with your product or one that is sold separately in stores. Follow the mixing directions on the package if you need to mix with sterile or distilled  water. 4. Fill the device with the amount of saline solution noted in the device instructions. 5. Stand over a sink and tilt your head sideways over the sink. 6. Place the spout of the device in your upper nostril (the one closer to the ceiling). 7. Gently pour or squeeze the saline solution into your nasal cavity. The liquid should drain out from the lower nostril if you are not too congested. 8. While rinsing, breathe through your open mouth. 9. Gently blow your nose to clear any mucus and rinse solution. Blowing too hard may cause ear pain. 10. Repeat in your other nostril. 11. Clean and rinse your device with clean water and then air-dry it. Talk with your health care provider or pharmacist if you have questions about how to do a sinus rinse. Summary  A sinus rinse is a home treatment that is used to rinse your sinuses with a sterile mixture of salt and water (saline solution).  A sinus rinse is generally safe and effective. Follow all instructions carefully.  Before doing a sinus rinse, talk with your health care provider about whether it would be helpful for you. This information is not intended to replace advice given to you by your health care provider. Make sure you discuss any questions you have with your health care provider. Document Released: 03/21/2014 Document Revised: 06/21/2017 Document Reviewed: 06/21/2017 Elsevier Interactive Patient Education  2019 [employer]. Otitis Media, Adult  Otitis  media occurs when there is inflammation and fluid in the middle ear. Your middle ear is a part of the ear that contains bones for hearing as well as air that helps send sounds to your brain. What are the causes? This condition is caused by a blockage in the eustachian tube. This tube drains fluid from the ear to the back of the nose (nasopharynx). A blockage in this tube can be caused by an object or by swelling (edema) in the tube. Problems that can cause a blockage include:  A cold  or other upper respiratory infection.  Allergies.  An irritant, such as tobacco smoke.  Enlarged adenoids. The adenoids are areas of soft tissue located high in the back of the throat, behind the nose and the roof of the mouth.  A mass in the nasopharynx.  Damage to the ear caused by pressure changes (barotrauma). What are the signs or symptoms? Symptoms of this condition include:  Ear pain.  A fever.  Decreased hearing.  A headache.  Tiredness (lethargy).  Fluid leaking from the ear.  Ringing in the ear. How is this diagnosed? This condition is diagnosed with a physical exam. During the exam your health care provider will use an instrument called an otoscope to look into your ear and check for redness, swelling, and fluid. He or she will also ask about your symptoms. Your health care provider may also order tests, such as:  A test to check the movement of the eardrum (pneumatic otoscopy). This test is done by squeezing a small amount of air into the ear.  A test that changes air pressure in the middle ear to check how well the eardrum moves and whether the eustachian tube is working (tympanogram). How is this treated? This condition usually goes away on its own within 3-5 days. But if the condition is caused by a bacteria infection and does not go away own its own, or keeps coming back, your health care provider may:  Prescribe antibiotic medicines to treat the infection.  Prescribe or recommend medicines to control pain. Follow these instructions at home:  Take over-the-counter and prescription medicines only as told by your health care provider.  If you were prescribed an antibiotic medicine, take it as told by your health care provider. Do not stop taking the antibiotic even if you start to feel better.  Keep all follow-up visits as told by your health care provider. This is important. Contact a health care provider if:  You have bleeding from your nose.  There  is a lump on your neck.  You are not getting better in 5 days.  You feel worse instead of better. Get help right away if:  You have severe pain that is not controlled with medicine.  You have swelling, redness, or pain around your ear.  You have stiffness in your neck.  A part of your face is paralyzed.  The bone behind your ear (mastoid) is tender when you touch it.  You develop a severe headache. Summary  Otitis media is redness, soreness, and swelling of the middle ear.  This condition usually goes away on its own within 3-5 days.  If the problem does not go away in 3-5 days, your health care provider may prescribe or recommend medicines to treat your symptoms.  If you were prescribed an antibiotic medicine, take it as told by your health care provider. This information is not intended to replace advice given to you by your health care provider.  Make sure you discuss any questions you have with your health care provider. Document Released: 05/29/2004 Document Revised: 08/14/2016 Document Reviewed: 08/14/2016 Elsevier Interactive Patient Education  2019 [employer] Viral Respiratory Infection A respiratory infection is an illness that affects part of the respiratory system, such as the lungs, nose, or throat. A respiratory infection that is caused by a virus is called a viral respiratory infection. Common types of viral respiratory infections include:  A cold.  The flu (influenza).  A respiratory syncytial virus (RSV) infection. What are the causes? This condition is caused by a virus. What are the signs or symptoms? Symptoms of this condition include:  A stuffy or runny nose.  Yellow or green nasal discharge.  A cough.  Sneezing.  Fatigue.  Achy muscles.  A sore throat.  Sweating or chills.  A fever.  A headache. How is this diagnosed? This condition may be diagnosed based on:  Your symptoms.  A physical exam.  Testing of nasal swabs. How  is this treated? This condition may be treated with medicines, such as:  Antiviral medicine. This may shorten the length of time a person has symptoms.  Expectorants. These make it easier to cough up mucus.  Decongestant nasal sprays.  Acetaminophen or NSAIDs to relieve fever and pain. Antibiotic medicines are not prescribed for viral infections. This is because antibiotics are designed to kill bacteria. They are not effective against viruses. Follow these instructions at home:  Managing pain and congestion  Take over-the-counter and prescription medicines only as told by your health care provider.  If you have a sore throat, gargle with a salt-water mixture 3-4 times a day or as needed. To make a salt-water mixture, completely dissolve -1 tsp of salt in 1 cup of warm water.  Use nose drops made from salt water to ease congestion and soften raw skin around your nose.  Drink enough fluid to keep your urine pale yellow. This helps prevent dehydration and helps loosen up mucus. General instructions  Rest as much as possible.  Do not drink alcohol.  Do not use any products that contain nicotine or tobacco, such as cigarettes and e-cigarettes. If you need help quitting, ask your health care provider.  Keep all follow-up visits as told by your health care provider. This is important. How is this prevented?   Get an annual flu shot. You may get the flu shot in late summer, fall, or winter. Ask your health care provider when you should get your flu shot.  Avoid exposing others to your respiratory infection. ? Stay home from work or school as told by your health care provider. ? Wash your hands with soap and water often, especially after you cough or sneeze. If soap and water are not available, use alcohol-based hand sanitizer.  Avoid contact with people who are sick during cold and flu season. This is generally fall and winter. Contact a health care provider if:  Your symptoms  last for 10 days or longer.  Your symptoms get worse over time.  You have a fever.  You have severe sinus pain in your face or forehead.  The glands in your jaw or neck become very swollen. Get help right away if you:  Feel pain or pressure in your chest.  Have shortness of breath.  Faint or feel like you will faint.  Have severe and persistent vomiting.  Feel confused or disoriented. Summary  A respiratory infection is an illness that affects part of the respiratory system,  such as the lungs, nose, or throat. A respiratory infection that is caused by a virus is called a viral respiratory infection.  Common types of viral respiratory infections are a cold, influenza, and respiratory syncytial virus (RSV) infection.  Symptoms of this condition include a stuffy or runny nose, cough, sneezing, fatigue, achy muscles, sore throat, and fevers or chills.  Antibiotic medicines are not prescribed for viral infections. This is because antibiotics are designed to kill bacteria. They are not effective against viruses. This information is not intended to replace advice given to you by your health care provider. Make sure you discuss any questions you have with your health care provider. Document Released: 06/03/2005 Document Revised: 10/04/2017 Document Reviewed: 10/04/2017 Elsevier Interactive Patient Education  2019 [employer]. Cough, Adult  Coughing is a reflex that clears your throat and your airways. Coughing helps to heal and protect your lungs. It is normal to cough occasionally, but a cough that happens with other symptoms or lasts a long time may be a sign of a condition that needs treatment. A cough may last only 2-3 weeks (acute), or it may last longer than 8 weeks (chronic). What are the causes? Coughing is commonly caused by:  Breathing in substances that irritate your lungs.  A viral or bacterial respiratory infection.  Allergies.  Asthma.  Postnasal  drip.  Smoking.  Acid backing up from the stomach into the esophagus (gastroesophageal reflux).  Certain medicines.  Chronic lung problems, including COPD (or rarely, lung cancer).  Other medical conditions such as heart failure. Follow these instructions at home: Pay attention to any changes in your symptoms. Take these actions to help with your discomfort:  Take medicines only as told by your health care provider. ? If you were prescribed an antibiotic medicine, take it as told by your health care provider. Do not stop taking the antibiotic even if you start to feel better. ? Talk with your health care provider before you take a cough suppressant medicine.  Drink enough fluid to keep your urine clear or pale yellow.  If the air is dry, use a cold steam vaporizer or humidifier in your bedroom or your home to help loosen secretions.  Avoid anything that causes you to cough at work or at home.  If your cough is worse at night, try sleeping in a semi-upright position.  Avoid cigarette smoke. If you smoke, quit smoking. If you need help quitting, ask your health care provider.  Avoid caffeine.  Avoid alcohol.  Rest as needed. Contact a health care provider if:  You have new symptoms.  You cough up pus.  Your cough does not get better after 2-3 weeks, or your cough gets worse.  You cannot control your cough with suppressant medicines and you are losing sleep.  You develop pain that is getting worse or pain that is not controlled with pain medicines.  You have a fever.  You have unexplained weight loss.  You have night sweats. Get help right away if:  You cough up blood.  You have difficulty breathing.  Your heartbeat is very fast. This information is not intended to replace advice given to you by your health care provider. Make sure you discuss any questions you have with your health care provider. Document Released: 02/20/2011 Document Revised: 01/30/2016  Document Reviewed: 10/31/2014 Elsevier Interactive Patient Education  2019 [employer]. Acute Bronchitis, Adult  Acute bronchitis is sudden (acute) swelling of the air tubes (bronchi) in the lungs. Acute bronchitis causes  these tubes to fill with mucus, which can make it hard to breathe. It can also cause coughing or wheezing. In adults, acute bronchitis usually goes away within 2 weeks. A cough caused by bronchitis may last up to 3 weeks. Smoking, allergies, and asthma can make the condition worse. Repeated episodes of bronchitis may cause further lung problems, such as chronic obstructive pulmonary disease (COPD). What are the causes? This condition can be caused by germs and by substances that irritate the lungs, including:  Cold and flu viruses. This condition is most often caused by the same virus that causes a cold.  Bacteria.  Exposure to tobacco smoke, dust, fumes, and air pollution. What increases the risk? This condition is more likely to develop in people who:  Have close contact with someone with acute bronchitis.  Are exposed to lung irritants, such as tobacco smoke, dust, fumes, and vapors.  Have a weak immune system.  Have a respiratory condition such as asthma. What are the signs or symptoms? Symptoms of this condition include:  A cough.  Coughing up clear, yellow, or green mucus.  Wheezing.  Chest congestion.  Shortness of breath.  A fever.  Body aches.  Chills.  A sore throat. How is this diagnosed? This condition is usually diagnosed with a physical exam. During the exam, your health care provider may order tests, such as chest X-rays, to rule out other conditions. He or she may also:  Test a sample of your mucus for bacterial infection.  Check the level of oxygen in your blood. This is done to check for pneumonia.  Do a chest X-ray or lung function testing to rule out pneumonia and other conditions.  Perform blood tests. Your health care  provider will also ask about your symptoms and medical history. How is this treated? Most cases of acute bronchitis clear up over time without treatment. Your health care provider may recommend:  Drinking more fluids. Drinking more makes your mucus thinner, which may make it easier to breathe.  Taking a medicine for a fever or cough.  Taking an antibiotic medicine.  Using an inhaler to help improve shortness of breath and to control a cough.  Using a cool mist vaporizer or humidifier to make it easier to breathe. Follow these instructions at home: Medicines  Take over-the-counter and prescription medicines only as told by your health care provider.  If you were prescribed an antibiotic, take it as told by your health care provider. Do not stop taking the antibiotic even if you start to feel better. General instructions   Get plenty of rest.  Drink enough fluids to keep your urine pale yellow.  Avoid smoking and secondhand smoke. Exposure to cigarette smoke or irritating chemicals will make bronchitis worse. If you smoke and you need help quitting, ask your health care provider. Quitting smoking will help your lungs heal faster.  Use an inhaler, cool mist vaporizer, or humidifier as told by your health care provider.  Keep all follow-up visits as told by your health care provider. This is important. How is this prevented? To lower your risk of getting this condition again:  Wash your hands often with soap and water. If soap and water are not available, use hand sanitizer.  Avoid contact with people who have cold symptoms.  Try not to touch your hands to your mouth, nose, or eyes.  Make sure to get the flu shot every year. Contact a health care provider if:  Your symptoms do not improve  in 2 weeks of treatment. Get help right away if:  You cough up blood.  You have chest pain.  You have severe shortness of breath.  You become dehydrated.  You faint or keep feeling  like you are going to faint.  You keep vomiting.  You have a severe headache.  Your fever or chills gets worse. This information is not intended to replace advice given to you by your health care provider. Make sure you discuss any questions you have with your health care provider. Document Released: 10/01/2004 Document Revised: 04/07/2017 Document Reviewed: 02/12/2016 Elsevier Interactive Patient Education  2019 [employer]. How to Use a Metered Dose Inhaler A metered dose inhaler is a handheld device for taking medicine that must be breathed into the lungs (inhaled). The device can be used to deliver a variety of inhaled medicines, including:  Quick relief or rescue medicines, such as bronchodilators.  Controller medicines, such as corticosteroids. The medicine is delivered by pushing down on a metal canister to release a preset amount of spray and medicine. Each device contains the amount of medicine that is needed for a preset number of uses (inhalations). Your health care provider may recommend that you use a spacer with your inhaler to help you take the medicine more effectively. A spacer is a plastic tube with a mouthpiece on one end and an opening that connects to the inhaler on the other end. A spacer holds the medicine in a tube for a short time, which allows you to inhale more medicine. What are the risks? If you do not use your inhaler correctly, medicine might not reach your lungs to help you breathe. Inhaler medicine can cause side effects, such as:  Mouth or throat infection.  Cough.  Hoarseness.  Headache.  Nausea and vomiting.  Lung infection (pneumonia) in people who have a lung condition called COPD. How to use a metered dose inhaler without a spacer  12. Remove the cap from the inhaler. 13. If you are using the inhaler for the first time, shake it for 5 seconds, turn it away from your face, then release 4 puffs into the air. This is called priming. 14. Shake  the inhaler for 5 seconds. 15. Position the inhaler so the top of the canister faces up. 16. Put your index finger on the top of the medicine canister. Support the bottom of the inhaler with your thumb. 17. Breathe out normally and as completely as possible, away from the inhaler. 18. Either place the inhaler between your teeth and close your lips tightly around the mouthpiece, or hold the inhaler 1-2 inches (2.5-5 cm) away from your open mouth. Keep your tongue down out of the way. If you are unsure which technique to use, ask your health care provider. 19. Press the canister down with your index finger to release the medicine, then inhale deeply and slowly through your mouth (not your nose) until your lungs are completely filled. Inhaling should take 4-6 seconds. 20. Hold the medicine in your lungs for 5-10 seconds (10 seconds is best). This helps the medicine get into the small airways of your lungs. 21. With your lips in a tight circle (pursed), breathe out slowly. 22. Repeat steps 3-10 until you have taken the number of puffs that your health care provider directed. Wait about 1 minute between puffs or as directed. 23. Put the cap on the inhaler. 24. If you are using a steroid inhaler, rinse your mouth with water, gargle, and spit out  the water. Do not swallow the water. How to use a metered dose inhaler with a spacer  1. Remove the cap from the inhaler. 2. If you are using the inhaler for the first time, shake it for 5 seconds, turn it away from your face, then release 4 puffs into the air. This is called priming. 3. Shake the inhaler for 5 seconds. 4. Place the open end of the spacer onto the inhaler mouthpiece. 5. Position the inhaler so the top of the canister faces up and the spacer mouthpiece faces you. 6. Put your index finger on the top of the medicine canister. Support the bottom of the inhaler and the spacer with your thumb. 7. Breathe out normally and as completely as possible,  away from the spacer. 8. Place the spacer between your teeth and close your lips tightly around it. Keep your tongue down out of the way. 9. Press the canister down with your index finger to release the medicine, then inhale deeply and slowly through your mouth (not your nose) until your lungs are completely filled. Inhaling should take 4-6 seconds. 10. Hold the medicine in your lungs for 5-10 seconds (10 seconds is best). This helps the medicine get into the small airways of your lungs. 11. With your lips in a tight circle (pursed), breathe out slowly. 12. Repeat steps 3-11 until you have taken the number of puffs that your health care provider directed. Wait about 1 minute between puffs or as directed. 13. Remove the spacer from the inhaler and put the cap on the inhaler. 14. If you are using a steroid inhaler, rinse your mouth with water, gargle, and spit out the water. Do not swallow the water. Follow these instructions at home:  Take your inhaled medicine only as told by your health care provider. Do not use the inhaler more than directed by your health care provider.  Keep all follow-up visits as told by your health care provider. This is important.  If your inhaler has a counter, you can check it to determine how full your inhaler is. If your inhaler does not have a counter, ask your health care provider when you will need to refill your inhaler and write the refill date on a calendar or on your inhaler canister. Note that you cannot know when an inhaler is empty by shaking it.  Follow directions on the package insert for care and cleaning of your inhaler and spacer. Contact a health care provider if:  Symptoms are only partially relieved with your inhaler.  You are having trouble using your inhaler.  You have an increase in phlegm.  You have headaches. Get help right away if:  You feel little or no relief after using your inhaler.  You have dizziness.  You have a fast heart  rate.  You have chills or a fever.  You have night sweats.  There is blood in your phlegm. Summary  A metered dose inhaler is a handheld device for taking medicine that must be breathed into the lungs (inhaled).  The medicine is delivered by pushing down on a metal canister to release a preset amount of spray and medicine.  Each device contains the amount of medicine that is needed for a preset number of uses (inhalations). This information is not intended to replace advice given to you by your health care provider. Make sure you discuss any questions you have with your health care provider. Document Released: 08/24/2005 Document Revised: 03/15/2017 Document Reviewed: 07/14/2016 Elsevier  Interactive Patient Education  2019 [employer]  Abrasion  An abrasion is a cut or a scrape on the outer surface of the skin. An abrasion does not go through all the layers of the skin. It is important to care for your abrasion properly to prevent infection. What are the causes? This condition is caused by falling on or gliding across the ground or another surface. When your skin rubs on something, the outer and inner layers of skin may rub off. What are the signs or symptoms? The main symptom of this condition is a cut or a scrape. The scrape may be bleeding, or it may appear red or pink. If the abrasion was caused by a fall, there may be a bruise under the cut or scrape. How is this diagnosed? An abrasion is diagnosed with a physical exam. How is this treated? Treatment for this condition depends on how large and deep the abrasion is. In most cases:  Your abrasion will be cleaned with water and mild soap. This is done to remove any dirt or debris (such as particles of glass or rock) that may be stuck in the wound.  An antibiotic ointment may be applied to the abrasion to help prevent infection.  A bandage (dressing) may be placed on the abrasion to keep it clean. You may also need a tetanus  shot. Follow these instructions at home: Medicines  Take or apply over-the-counter and prescription medicines only as told by your health care provider.  If you were prescribed an antibiotic medicine, apply it as told by your health care provider. Wound care  Clean the wound 2-3 times a day, or as directed by your health care provider. To do this, wash the wound with mild soap and water, rinse off the soap, and pat the wound dry with a clean towel. Do not rub the wound.  Keep the dressing clean and dry as told by your health care provider.  There are many different ways to close and cover a wound. Follow instructions from your health care provider about: ? Caring for your wound. ? Changing and removing your dressing. You may have to change your dressing one or more times a day, or as directed by your health care provider.  Check your wound every day for signs of infection. Check for: ? Redness, particularly a red streak that spreads out from the wound. ? Swelling or increased pain. ? Warmth. ? Fluid, pus, or a bad smell.  If directed, put ice on the injured area to reduce pain and swelling: ? Put ice in a plastic bag. ? Place a towel between your skin and the bag. ? Leave the ice on for 20 minutes, 2-3 times a day. General instructions  Do not take baths, swim, or use a hot tub until your health care provider says it is okay to do so.  If possible, raise (elevate) the injured area above the level of your heart while you are sitting or lying down. This will reduce pain and swelling.  Keep all follow-up visits as directed by your health care provider. This is important. Contact a health care provider if:  You received a tetanus shot, and you have swelling, severe pain, redness, or bleeding at the injection site.  Your pain is not controlled with medicine.  You have redness, swelling, or more pain at the site of your wound. Get help right away if:  You have a red streak  spreading away from your wound.  You  have a fever.  You have fluid, blood, or pus coming from your wound.  You notice a bad smell coming from your wound or your dressing. Summary  An abrasion is a cut or a scrape on the outer surface of the skin. An abrasion does not go through all the layers of the skin.  Care for your abrasion properly to prevent infection.  Clean the wound with mild soap and water 2-3 times a day. Follow instructions from your health care provider about taking medicines and changing your bandage (dressing).  Contact your health care provider if you have redness, swelling or more pain in the wound area.  Get help right away if you have a fever or if you have fluid, blood, pus, a bad smell, or a red streak coming from the wound. This information is not intended to replace advice given to you by your health care provider. Make sure you discuss any questions you have with your health care provider. Document Released: 06/03/2005 Document Revised: 04/07/2017 Document Reviewed: 04/07/2017 Elsevier Interactive Patient Education  2019 [employer]

## 2019-04-11 ENCOUNTER — Ambulatory Visit: Payer: Self-pay | Admitting: *Deleted

## 2019-04-11 ENCOUNTER — Encounter: Payer: Self-pay | Admitting: Registered Nurse

## 2019-04-11 ENCOUNTER — Other Ambulatory Visit: Payer: Self-pay

## 2019-04-11 ENCOUNTER — Telehealth: Payer: Self-pay | Admitting: Registered Nurse

## 2019-04-11 DIAGNOSIS — Z20822 Contact with and (suspected) exposure to covid-19: Secondary | ICD-10-CM

## 2019-04-11 DIAGNOSIS — H1032 Unspecified acute conjunctivitis, left eye: Secondary | ICD-10-CM

## 2019-04-11 MED ORDER — REFRESH PLUS 0.5 % OP SOLN: 1.0000 [drp] | Freq: Three times a day (TID) | OPHTHALMIC | 1 refills | Status: AC | PRN

## 2019-04-11 MED ORDER — ERYTHROMYCIN 5 MG/GM OP OINT: 1.0000 "application " | TOPICAL_OINTMENT | Freq: Four times a day (QID) | OPHTHALMIC | 0 refills | Status: AC

## 2019-04-11 NOTE — Telephone Encounter (Signed)
Per RN Hildred Alamin Pt reported to HR manager Hyacinth Meeker via telephone that she was experiencing sinus pressure/pain, fatigue, and red L eye x2 days. L eye with yellow, thick drainage, wakes up with it matted shut. Called [facility] and was called in an abx for sinus infection but pharmacy told her that she was allergic to what they called in for her. So she has not picked that up. [facility] told her they did not recommend testing for Covid, however our clinic protocols do and NP Orah Sonnen ordered test for drive thru testing [employer], [place] location. Pt called and given drive thru testing center instructions and address. Told to expect results in 4-7 days. Will be working remotely from home while waiting for covid test results. RN Hildred Alamin spoke with patient via telephone and myself concurrently.  Electronic Rx for erythromycin ointment and refresh gtts generic sent to patient's pharmacy of choice.  Patient to follow up for re-evaluation if worsening symptoms despite plan of care and follow up with a provider for re-evaluation.  Patient not allowed onsite/access to [facility] while awaiting covid test results based on employer policies she would need to see PCM/UC or ER.   Patient to apply warm or cool packs prn right eye 5 minutes TID prn or warm compress. 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  Refresh drops 2 left eye TID x 7 days  Erythromycin ointment ophthalmic 0.5% instill 1cm ribbon right eye BID x 7 days #1 RF0 Blinking irritates eye blood vessels further as eyelid vessels rub on bulbar vessals. Instructed patient to not rub eyes. May need to wash pillowcases more frequently until infection resolves if discharge noted on pillow. May use over the counter eye drops/tears such as visine per manufacturer instructions for redness/ pain/symptom relief. Return to clinic if headache, fever greater than 100.17F, nausea/vomiting, purulent discharge/matting unable to  open eye without using fingers after 24 hours of medication use, foreign body sensation, ciliary flush, worsening photophobia or vision. See optometrist same day if visual field loss, worsening light sensitivity, orbital swelling.  Call or return to clinic as needed if these symptoms worsen or fail to improve as anticipated. Exitcare handout on bacterial conjunctivitis. Patient verbalized agreement and understanding of treatment plan and had no further questions at this time.  P2: Hand washing, avoid contact use-wear glasses.

## 2019-04-11 NOTE — Progress Notes (Addendum)
Pt reported to HR manager Olivia Hamilton that she was experiencing sinus pressure/pain, fatigue, and red L eye x2 days. L eye with yellow, thick drainage, wakes up with it matted shut. Called [facility] and was called in an abx for sinus infection but pharmacy told her that she was allergic to what they called in for her. So she has not picked that up. [facility] told her they did not recommend testing for Covid, however our clinic protocols do. Test ordered. Pt called and given drive thru testing center instructions and address. Told to expect results in 4-7 days. Will be working remotely while waiting for results.

## 2019-04-12 LAB — NOVEL CORONAVIRUS, NAA: SARS-CoV-2, NAA: NOT DETECTED

## 2019-04-13 NOTE — Progress Notes (Signed)
Pt returned call. Sts she had a migraine with n/v yesterday. Weak today from n/v yesterday, but sx have resolved now. L eye no longer matted in morning, and pt reports no redness, irritation or drainage through the day. Sinus pain/pressure improved on R maxillary and frontal. Hyacinth Meeker, HR made aware and will contact pt re: return to work.

## 2019-04-13 NOTE — Progress Notes (Signed)
noted 

## 2019-04-13 NOTE — Progress Notes (Signed)
See previous results note-noted

## 2019-04-13 NOTE — Progress Notes (Signed)
LVM requesting return call from pt to give results and discuss current sx.

## 2019-06-20 ENCOUNTER — Telehealth: Payer: Self-pay | Admitting: Registered Nurse

## 2019-06-20 ENCOUNTER — Encounter: Payer: Self-pay | Admitting: Registered Nurse

## 2019-06-20 DIAGNOSIS — J019 Acute sinusitis, unspecified: Secondary | ICD-10-CM

## 2019-06-20 MED ORDER — SALINE SPRAY 0.65 % NA SOLN: 2.0000 | NASAL | 0 refills | Status: DC

## 2019-06-20 NOTE — Telephone Encounter (Signed)
Patient reported sinus pressure/pain, congestion nasal and post nasal drip and she was smelling cigarettes yesterday but no smoking allowed in building at work.  Denied loss of taste/smell, fever, chills, cough, diarrhea, vomiting, rash, ear discharge, loss of hearing.  Has not been exposed to known covid + persons.  Hasn't been taking her flonase nasal consistently or using her nasal saline but has been taking OTC antihistamine recently noted claritin was making her drowsy so switched to taking it at bedtime.  Not using sudafed right now.  Instructed patient to restart nasal saline use 2 sprays each nostril q2h wa and more aggressive use in shower.  Ensure showering prior to bedtime and do not sleep with windows open.  Follow up if new or worsening symptoms despite plan of care.  I recommended flonase nasal 1 spray each nostril BID instead of 2 sprays once a day.  Consider adding singulair 10mg  po qhs if typically fall allergy flare/always getting a sinus infection same time every year.   Discussed black box warning mood changes/insomnia with patient regarding singulair also.  Patient to notify us if she would like to start singulair next week she is going to restart nasal saline at this time.  Patient verbalized understanding information/instructions, agreed with plan of care and had no further questions at this time.

## 2019-07-12 ENCOUNTER — Encounter: Payer: Self-pay | Admitting: Registered Nurse

## 2019-07-12 ENCOUNTER — Telehealth: Payer: Self-pay | Admitting: Registered Nurse

## 2019-07-12 DIAGNOSIS — Z20822 Contact with and (suspected) exposure to covid-19: Secondary | ICD-10-CM

## 2019-07-12 NOTE — Telephone Encounter (Signed)
Patient has been in close contact with her sister and after work yesterday developed fever and URI symptoms consistent with Covid and/or flu.  Her sister was told to quarantine and have covid testing.  Patient was told by her supervisor to remain home on quarantine and seek advice from clinic staff regarding how long she should quarantine and when to get tested.  I recommend quarantine for 14 days/working from home and testing next week at the earliest or a couple days after any new symptoms start per CDC guidelines.

## 2019-07-13 NOTE — Telephone Encounter (Addendum)
Spoke with pt over phone. Per NP notes, she was in close contact with sister over the weekend who then developed fever and URI sx evening of 07/11/19. Pt and sister both working from home for quarantine purposes as of 07/12/19. Pt remains asymptomatic. 14 day quarantine, based on last exposure date of 07/09/19 with plan to test on Day 10, 07/18/19. Pt to contact clinic if she begins having sx and will adjust testing schedule as indicated per CDC guidelines. If negative test, and remains asymptomatic, she can return to work 07/23/19. Pt does question if she can continue plan to move on 07/22/19, has moving [place] rented. Advised pt to contact RN by phone or email on 11/10 to reevaluate sx and will discuss further at that time. Instructions for testing site, remaining in car, wear mask, as well as ER precautions given to pt. She has no further questions/concerns at this time.

## 2019-07-13 NOTE — Telephone Encounter (Signed)
Noted agree

## 2019-07-18 ENCOUNTER — Other Ambulatory Visit: Payer: Self-pay

## 2019-07-18 DIAGNOSIS — Z20822 Contact with and (suspected) exposure to covid-19: Secondary | ICD-10-CM

## 2019-07-20 LAB — NOVEL CORONAVIRUS, NAA: SARS-CoV-2, NAA: NOT DETECTED

## 2019-07-20 NOTE — Telephone Encounter (Signed)
Spoke with pt over phone. She reports still asymptomatic. Does endorse seeing sister on 11/2 and 11/3 in addition to that previous weekend. So quarantine dates are actually 11/4-17/20, returning to work on 07/26/19. Advised of negative test and updated quarantine dates. Call clinic if sx develop. She verbalizes understanding and agreement. No further questions.

## 2019-07-20 NOTE — Telephone Encounter (Signed)
Noted agree with plan of care 

## 2019-07-29 NOTE — Telephone Encounter (Signed)
Please follow up to ensure patient returned to work as planned.

## 2019-08-07 NOTE — Telephone Encounter (Signed)
Pt did return to work as planned on 07/26/19.

## 2019-08-07 NOTE — Telephone Encounter (Signed)
noted 

## 2019-08-17 ENCOUNTER — Encounter: Payer: Self-pay | Admitting: *Deleted

## 2019-08-17 ENCOUNTER — Telehealth: Payer: Self-pay | Admitting: *Deleted

## 2019-08-17 MED ORDER — KETOTIFEN FUMARATE 0.025 % OP SOLN: 1.0000 [drp] | Freq: Two times a day (BID) | OPHTHALMIC | 0 refills | Status: DC

## 2019-08-17 MED ORDER — REFRESH PLUS 0.5 % OP SOLN: 1.0000 [drp] | Freq: Three times a day (TID) | OPHTHALMIC | 0 refills | Status: AC | PRN

## 2019-08-17 MED ORDER — SALINE SPRAY 0.65 % NA SOLN: 2.0000 | NASAL | 0 refills | Status: DC

## 2019-08-17 MED ORDER — FLUTICASONE PROPIONATE 50 MCG/ACT NA SUSP: 1.0000 | Freq: Two times a day (BID) | NASAL | 1 refills | Status: DC | PRN

## 2019-08-17 NOTE — Telephone Encounter (Signed)
Pt reporting itchy, watery eyes for past 2 weeks. They feel irritated and sensation of foreign body present. Requesting antihistamine eye drops. Verified preferred med with NP Betancourt. Sent to pharmacy of choice. Also confirmed pt is continuing Flonase and antihistamine of choice daily use.

## 2019-08-17 NOTE — Telephone Encounter (Signed)
Noted electronic Rx signed ketotifen and confirm RN Hildred Alamin discussed pt complaints and plan of care with me in clinic earlier today.  Dx allergic conjunctivitis  Patient to follow up re-evaluation if no improvement with oral antihistamine OTC of her choice and ketotifen and refresh drops prn.  Late entry.

## 2019-08-17 NOTE — Telephone Encounter (Signed)
Olapatadine in same family as ketotifen.  Patient to pick one to use.  Do not use both in the same day.  She may add refresh drops 2gtts ou TID prn eye redness/itching/irritation also.  Shower prior to going to bed.  Consider changing pillow cases/laundering more frequently also.  If blurred vision not improving after oral and eye drop antihistamine use, eye pain worsening, swelling of orbits, light sensitivity seek re-evaluation with a provider same day.  Refilled flonase 1 spray each nostril BID #1 RF1 to her pharmacy of choice 16gm also as expired.

## 2019-08-18 NOTE — Telephone Encounter (Signed)
Noted. Pt denied olapatadine use. Sts this was Rx'd over the summer by [facility] and if she has it, she doesn't know where it is. Was unable to pick up Ketotifen last night due to working late. Offered Refresh gtts for today. Pt will come pick up from clinic if needed. Discussed hygiene and same day care precautions Thursday morning when pt originally c/o eye irritation.

## 2019-08-18 NOTE — Addendum Note (Signed)
Addended by: Gerarda Fraction A on: 08/18/2019 04:11 PM   Modules accepted: Orders

## 2019-08-18 NOTE — Telephone Encounter (Signed)
Noted will delete olapatadine from patient medication list.

## 2019-09-16 ENCOUNTER — Other Ambulatory Visit: Payer: Self-pay | Admitting: Registered Nurse

## 2019-09-17 NOTE — Telephone Encounter (Signed)
Electronic Rx to her pharmacy of choice refill flonase 1 spray each nostril BID prn rhinitis #1 RF1.

## 2019-09-19 ENCOUNTER — Encounter: Payer: Self-pay | Admitting: *Deleted

## 2019-09-19 ENCOUNTER — Telehealth: Payer: Self-pay | Admitting: *Deleted

## 2019-09-19 DIAGNOSIS — J019 Acute sinusitis, unspecified: Secondary | ICD-10-CM

## 2019-09-19 MED ORDER — PSEUDOEPH-BROMPHEN-DM 30-2-10 MG/5ML PO SYRP: 5.0000 mL | ORAL_SOLUTION | Freq: Four times a day (QID) | ORAL | 0 refills | Status: AC | PRN

## 2019-09-19 MED ORDER — SALINE SPRAY 0.65 % NA SOLN: 2.0000 | NASAL | 0 refills | Status: DC

## 2019-09-19 MED ORDER — ALBUTEROL SULFATE 108 (90 BASE) MCG/ACT IN AEPB: 1.0000 | INHALATION_SPRAY | RESPIRATORY_TRACT | 0 refills | Status: DC | PRN

## 2019-09-19 MED ORDER — AZITHROMYCIN 250 MG PO TABS: ORAL_TABLET | ORAL | 0 refills | Status: DC

## 2019-09-19 NOTE — Telephone Encounter (Signed)
RN made aware by HR manager of pt becoming symptomatic and working remotely.   RN spoke with pt over phone. She reports sinusitis pain/pressure on L side of face, including forehead, cheek, behind L eye. Also with L otalgia/pressure. Denies ear drainage, upper dental pain, sore throat, cough, chest congestion, body aches, fever/chills, loss of taste or smell. Pt does endorse increasing fatigue, feeling very run down, and having very little energy to complete daily tasks. Sx started Sunday 09/17/19. Pt using Flonase, nasal saline spray at home.  Pt does request a Zpak to have on hand if sx worsen while she is quarantining. Advised pt that she will likely need a virtual visit/phone call with NP for evaluation. Advised pt that RN will notify NP of request, but NP not scheduled back in clinic until 09/21/19. Pt verbalizes understanding.   Advised pt to follow 10 day symptomatic quarantine per CDC recommendations. Advised pt that Day 1 of quarantine is 09/18/19. Plan for testing on Day 5, date 09/22/19. Pt prefers [facility] testing site. Appt made for 1/15 at 10:00a. Reviewed drive up testing instructions and directions with pt, ie attend appt time, remain in vehicle, wear mask, results back in 2-3 days. Will f/u pt on 09/25/19 with anticipated results. If sx improving and no fever in previous >24hrs without antipyretics, pt to complete quarantine 09/27/19 and return to work 09/28/19.    Reviewed possible Covid sx including cough, ShOB, sinusitis sx, sore throat, fever/chills, body aches, fatigue, loss of taste/smell, GI sx n/v/d. Also reviewed same day/emergent eval/ER precautions of dizziness/syncope, confusion, blue tint to lips/face, severe ShOB/difficulty breathing.    Pt verbalizes understanding and agreement with plan of care. No further questions/concerns at this time. Pt reminded to contact clinic with any changes in sx or questions/concerns.

## 2019-09-19 NOTE — Telephone Encounter (Signed)
Sister Rosey Bath sick also and quaranting/pending test results.  Pressure sinus no drainage "I am stopped up".  Was only taking claritin at bedtime and not using flonase/saline regularly after allergy season slowed down.  Patient recently moved now at [address] [place]  Address correct in [employer] system.  Patient does not know where her inhaler or bromphed is.  Last azithromycin use 09/29/2018 for bronchitis/sinusitis.  Bromphed worked well for her cough and inhaler used prn only if still cough/tight chest.  Patient to restart flonase 1 spray each nostril BID electronic refill sent to her pharmacy this weekend when I received electronic request via Epic.  Patient stated she did receive notice from pharmacy that I had refilled her flonase.  Patient started quarantine at home today.  Feeling fatigued enough that she doesn't feel she can work from home right now but once her energy/symptoms improve she will notify her supervisor. Electronic Rx to her pharmacy of choice for bromphed refill 2ml po QID prn cough/rhinitis #240 RF0 and albuterol MDI 1-2 puffs po q4-6h prn protracted cough/wheeze/chest tightness #1 RF0.  Patient may use normal saline nasal spray 2 sprays each nostril q2h wa as needed restart use and use every day multiple times per day. flonase 1 spray each nostril BID  Patient denied personal or family history of ENT cancer.  OTC antihistamine of choice claritin 10mg  po daily.  Avoid triggers if possible.  Shower prior to bedtime if exposed to triggers.  If allergic dust/dust mites recommend mattress/pillow covers/encasements; washing linens, vacuuming, sweeping, dusting weekly.  Call or return to clinic as needed if these symptoms worsen or fail to improve as anticipated.  Patient verbalized understanding of instructions, agreed with plan of care and had no further questions at this time.  P2:  Avoidance and hand washing.   Reviewed RN note and agreed with  plan of care for covid testing and quarantine also per CDC and [employer] employee covid pandemic policy.

## 2019-09-19 NOTE — Addendum Note (Signed)
Addended by: Albina Billet A on: 09/19/2019 07:28 PM   Modules accepted: Orders

## 2019-09-22 ENCOUNTER — Ambulatory Visit: Payer: PRIVATE HEALTH INSURANCE | Attending: Internal Medicine

## 2019-09-22 DIAGNOSIS — Z20822 Contact with and (suspected) exposure to covid-19: Secondary | ICD-10-CM

## 2019-09-23 LAB — NOVEL CORONAVIRUS, NAA: SARS-CoV-2, NAA: NOT DETECTED

## 2019-09-23 NOTE — Telephone Encounter (Signed)
Telephone message left for patient I am still awaiting test results and wondering if her symptoms improving with plan of care.  Notified patient she can email me at [email or web address] or use my chart and I would try to reach her again tomorrow.

## 2019-09-23 NOTE — Telephone Encounter (Signed)
Covid test results from 09/22/2019 still pending 

## 2019-09-24 NOTE — Telephone Encounter (Signed)
My chart message sent to patient "Olivia Hamilton,  Your covid test result was negative.  I recommend that you continue to quarantine at home through 09/27/2019.  Please contact [employer] prior to returning to work onsite 09/28/2019.  Please do not return onsite if fever in the previous 24 hours or worsening symptoms.  Please notify RN Rolly Salter or I via my chart, [email or web address] or extension [phone].  I hope the azithromycin, bromphed, flonase and nasal saline are helping.  I will call you later today to see if you have any questions or concerns.  Sincerely,  Albina Billet NP-C"  I will also need to clarify if patient has had to use her albuterol inhaler.

## 2019-09-24 NOTE — Telephone Encounter (Signed)
Patient contacted via telephone.  She had reviewed negative covid test results.  She had some GI symptoms frequent stools more than her usual one per day in the past few days but history of gluten intolerance thought she had some contaminated food.  Respiratory symptoms improved with flonase.  She did not take bromphed or azithromycin.  Patient verbalized understanding to continue monitoring for covid symptoms and maintaining home quarantine until approved to return onsite 09/28/2019 at [employer] by supervisor/HR.  Patient had no further questions at this time.

## 2019-09-25 NOTE — Telephone Encounter (Signed)
Noted. Plan to f/u with pt tomorrow 1/19 to ensure no worsening or new sx and no fever prior to planned return to work on 1/21.

## 2019-09-28 NOTE — Telephone Encounter (Signed)
Per RN Rolly Salter patient pack to work as expected.

## 2019-11-23 ENCOUNTER — Telehealth: Payer: Self-pay | Admitting: Registered Nurse

## 2019-11-23 ENCOUNTER — Encounter: Payer: Self-pay | Admitting: Registered Nurse

## 2019-11-23 NOTE — Telephone Encounter (Signed)
Notified by HR Tim patient called out sick   Contacted patient via telephone and she said she thought she had migraine which led to nausea and felt hot flushed and threw up after going to grocery store Monday night had chills around 2100.  Tuesday slept all day and only ate 3 crackers yesterday between 06-1099  Has been tired and using her flonase once a day, claritin every pm.  Hasn't vomited in previous 24 hours.  Patient going to stay home today from work as very fatigued and still not feeling well.   I have recommended clear fluids and bland diet.  Avoid dairy/spicy, fried and large portions of meat while having nausea.  If vomiting hold po intake x 1 hour.  Then sips clear fluids like broths, ginger ale, power ade, gatorade, pedialyte may advance to soft/bland if no vomiting x 24 hours and appetite returned otherwise hydration main focus. Notify clinic staff if symptoms persist or worsen; I have alerted the patient to call if high fever, dehydration, marked weakness, fainting, increased abdominal pain, blood in stool or vomit (red or black).  Discussed with patient her symptoms seem consistent with migraine at this time but if new or worsening symptoms today then consider repeat covid testing and quarantine.  Will check in with her on Thursday either RN [place] or I again.  Rest and hydrate today recommended.  HR Tim notified recommended patient to stay home today and determination tomorrow based on her symptoms if start quarantine/testing or may return to work.   Patient verbalized agreement and understanding of treatment plan and had no further questions at this time.

## 2019-11-23 NOTE — Telephone Encounter (Signed)
Pt is back on site at work today. Feels well. All sx resolved. Repeat covid testing and quarantine not indicated.

## 2019-11-23 NOTE — Telephone Encounter (Signed)
Noted symptoms resolved and patient back at work today.

## 2020-01-09 ENCOUNTER — Other Ambulatory Visit: Payer: Self-pay

## 2020-01-09 ENCOUNTER — Ambulatory Visit: Payer: Self-pay | Admitting: Registered Nurse

## 2020-01-09 VITALS — BP 122/82 | Temp 97.4°F

## 2020-01-09 DIAGNOSIS — B079 Viral wart, unspecified: Secondary | ICD-10-CM

## 2020-01-09 MED ORDER — SALICYLIC ACID 2 % EX LIQD: 1.0000 "application " | Freq: Three times a day (TID) | CUTANEOUS | 0 refills | Status: AC | PRN

## 2020-01-09 NOTE — Patient Instructions (Signed)
Warts ° °Warts are small growths on the skin. They are common and can occur on many areas of the body. A person may have one wart or several warts. °In many cases, warts do not require treatment. They usually go away on their own over a period of many months to a few years. If needed, warts that cause problems or do not go away on their own can be treated. °What are the causes? °Warts are caused by a type of virus that is called human papillomavirus (HPV). °· This virus can spread from person to person through direct contact. °· Warts can also spread to other areas of the body when a person scratches a wart and then scratches another area of his or her body. °What increases the risk? °You are more likely to develop this condition if: °· You are 10-20 years old. °· You have a weakened body defense system (immune system). °· You are Caucasian. °What are the signs or symptoms? °The main symptom of this condition is small growths on the skin. Warts may: °· Be round or oval or have an irregular shape. °· Have a rough surface. °· Range in color from skin color to light yellow, brown, or gray. °· Generally be less than ½ inch (1.3 cm) in size. °· Go away and then come back again. °Most warts are painless, but some can be painful if they are large or occur in an area of the body where pressure will be applied to them, such as the bottom of the foot. °How is this diagnosed? °A wart can usually be diagnosed based on its appearance. In some cases, a tissue sample may be removed (biopsy) to be looked at under a microscope. °How is this treated? °In many cases, warts do not need treatment. Sometimes treatment is desired. If treatment is needed or desired, options may include: °· Applying medicated solutions, creams, or patches to the wart. These may be over-the-counter or prescription medicines that make the skin soft so that layers will gradually shed away. In many cases, the medicine is applied one or two times per day and  covered with a bandage. °· Putting duct tape over the top of the wart (occlusion). You will leave the tape in place for as long as told by your health care provider and then replace it with a new strip of tape. This is done until the wart goes away. °· Freezing the wart with liquid nitrogen (cryotherapy). °· Burning the wart with: °? Laser treatment. °? An electrified probe (electrocautery). °· Injection of a medicine (Candida antigen) into the wart to help the body's immune system fight off the wart. °· Surgery to remove the wart. °Follow these instructions at home: °Medicines °· Apply over-the-counter and prescription medicines only as told by your health care provider. °· Do not apply over-the-counter wart medicines to your face or genitals unless your health care provider tells you to do that. °Lifestyle °· Keep your immune system healthy. To do this: °? Eat a healthy, balanced diet. °? Get enough sleep. °? Do not use any products that contain nicotine or tobacco, such as cigarettes and e-cigarettes. If you need help quitting, ask your health care provider. °General instructions ° °· Wash your hands after you touch a wart. °· Do not scratch or pick at a wart. °· Avoid shaving hair that is over a wart. °· Keep all follow-up visits as told by your health care provider. This is important. °Contact a health care provider if: °·   Your warts do not improve after treatment. °· You have redness, swelling, or pain at the site of a wart. °· You have bleeding from a wart that does not stop with light pressure. °· You have diabetes and you develop a wart. °Summary °· Warts are small growths on the skin. They are common and can occur on many areas of the body. °· In many cases, warts do not need treatment. Sometimes treatment is desired. If treatment is needed or desired, there are several treatment options. °· Apply over-the-counter and prescription medicines only as told by your health care provider. °· Wash your hands  after you touch a wart. °· Keep all follow-up visits as told by your health care provider. This is important. °This information is not intended to replace advice given to you by your health care provider. Make sure you discuss any questions you have with your health care provider. °Document Revised: 01/11/2018 Document Reviewed: 01/11/2018 °Elsevier Patient Education © 2020 [employer] ° °

## 2020-01-09 NOTE — Progress Notes (Signed)
Subjective:    Patient ID: Olivia Hamilton, female    DOB: [birth year], 54 y.o.   MRN: [id number]  53y/o Caucasian female pt c/o what she believed to be a wart on R anterior thumb. Has been present for approx 8 months, but has been increasingly painful, now throbbing at times. Treating with otc wart remover and tylenol at home with no relief.  Itchy at times.  Denied drainage.  Has had warts on feet in the past but never on fingers/hand.  Denied trauma or known objects e.g. sliver/thorn into thumb.       Review of Systems  Constitutional: Negative for activity change, appetite change, chills, diaphoresis, fatigue and fever.  HENT: Negative for trouble swallowing and voice change.   Eyes: Negative for photophobia and visual disturbance.  Respiratory: Negative for cough and wheezing.   Cardiovascular: Negative for chest pain.  Gastrointestinal: Negative for diarrhea, nausea and vomiting.  Endocrine: Negative for cold intolerance and heat intolerance.  Genitourinary: Negative for difficulty urinating.  Musculoskeletal: Positive for myalgias. Negative for arthralgias, back pain, gait problem, joint swelling, neck pain and neck stiffness.  Skin: Positive for color change and rash. Negative for pallor and wound.  Allergic/Immunologic: Negative for food allergies.  Neurological: Negative for tremors, syncope, weakness and numbness.  Hematological: Negative for adenopathy. Does not bruise/bleed easily.  Psychiatric/Behavioral: Negative for agitation, confusion and sleep disturbance.       Objective:   Physical Exam Vitals and nursing note reviewed.  Constitutional:      General: She is awake. She is not in acute distress.    Appearance: Normal appearance. She is well-developed, well-groomed and overweight. She is not ill-appearing, toxic-appearing or diaphoretic.  HENT:     Head: Normocephalic and atraumatic.     Jaw: There is normal jaw occlusion.     Right Ear: Hearing and external  ear normal.     Left Ear: Hearing and external ear normal.     Nose: Nose normal.     Mouth/Throat:     Mouth: No angioedema.     Pharynx: Oropharynx is clear.  Eyes:     General: Lids are normal. Vision grossly intact. Gaze aligned appropriately. Allergic shiner present. No visual field deficit or scleral icterus.       Right eye: No discharge.        Left eye: No discharge.     Extraocular Movements: Extraocular movements intact.     Conjunctiva/sclera: Conjunctivae normal.     Pupils: Pupils are equal, round, and reactive to light.  Neck:     Trachea: Trachea and phonation normal.  Cardiovascular:     Rate and Rhythm: Normal rate and regular rhythm.     Pulses: Normal pulses.          Radial pulses are 2+ on the right side and 2+ on the left side.  Pulmonary:     Effort: Pulmonary effort is normal. No respiratory distress.     Breath sounds: Normal breath sounds and air entry. No stridor, decreased air movement or transmitted upper airway sounds. No decreased breath sounds, wheezing, rhonchi or rales.     Comments: Spoke full sentences without difficulty; no cough observed in exam room; wearing cloth mask due to covid 19 pandemic Chest:     Chest wall: No tenderness.  Abdominal:     General: Abdomen is flat.     Palpations: Abdomen is soft.  Musculoskeletal:        General: Normal range of  motion.     Right shoulder: Normal.     Left shoulder: Normal.     Right elbow: Normal.     Right wrist: Normal.     Left wrist: Normal.     Right hand: Swelling and tenderness present. No deformity, lacerations or bony tenderness. Normal range of motion. Normal strength. Normal sensation. There is no disruption of two-point discrimination. Normal capillary refill. Normal pulse.     Left hand: Normal. No swelling, deformity, lacerations, tenderness or bony tenderness. Normal range of motion. Normal strength. Normal sensation. There is no disruption of two-point discrimination. Normal  capillary refill. Normal pulse.       Arms:     Cervical back: Normal, normal range of motion and neck supple. No swelling, edema, deformity, erythema, signs of trauma, lacerations, rigidity, spasms, torticollis or crepitus. No pain with movement. Normal range of motion.     Thoracic back: Normal.     Lumbar back: Normal.     Right hip: Normal.     Left hip: Normal.     Right knee: Normal.     Left knee: Normal.  Lymphadenopathy:     Head:     Right side of head: No submental, submandibular or preauricular adenopathy.     Left side of head: No submental, submandibular or preauricular adenopathy.     Cervical: No cervical adenopathy.     Right cervical: No superficial cervical adenopathy.    Left cervical: No superficial cervical adenopathy.  Skin:    General: Skin is warm and dry.     Capillary Refill: Capillary refill takes less than 2 seconds.     Coloration: Skin is not ashen, cyanotic, jaundiced, mottled, pale or sallow.     Findings: Erythema, lesion and rash present. No abrasion, abscess, acne, bruising, burn, ecchymosis, signs of injury, laceration, petechiae or wound. Rash is macular and scaling. Rash is not crusting, nodular, papular, purpuric, pustular, urticarial or vesicular.     Nails: There is no clubbing.          Comments: DIP/PIP/MCP joints without pain/erythema/swelling bilateral hands; Cleansed affected area with iodine then shaved right thumb lesion until pinpoint bleeding and slight tenderness; dressed with triple antibiotic and fingertip bandaid palmar right thumb distal phalanx after procedure  Neurological:     General: No focal deficit present.     Mental Status: She is alert and oriented to person, place, and time. Mental status is at baseline.     GCS: GCS eye subscore is 4. GCS verbal subscore is 5. GCS motor subscore is 6.     Cranial Nerves: Cranial nerves are intact. No cranial nerve deficit, dysarthria or facial asymmetry.     Sensory: Sensation is  intact. No sensory deficit.     Motor: Motor function is intact. No weakness, tremor, atrophy, abnormal muscle tone or seizure activity.     Coordination: Coordination is intact. Coordination normal.     Gait: Gait is intact. Gait normal.     Comments: Gait sure and steady in clinic; in/out of chair and on/off exam table without difficulty; bilateral hand grasp equal 5/5  Psychiatric:        Attention and Perception: Attention and perception normal.        Mood and Affect: Mood and affect normal.        Speech: Speech normal.        Behavior: Behavior normal. Behavior is cooperative.        Thought Content: Thought content normal.  Cognition and Memory: Cognition and memory normal.        Judgment: Judgment normal.           Assessment & Plan:  A-thumb wart initial encounter  P--Treated with shaving callous with #10 blade until pinpoint of blood noted and slight tenderness. Band-Aid and generic neosporin ointment UD applied from clinic stock.  May buy salicytic acid/compound W 2% apply TID starting tomorrow or use duct tape apply strip completely occluding affected area and leave on until 01/16/2020.  Patient reported minimal pain after procedure. Discussed with patient to keep clean and dry and follow up in 7 days for reshave.  Cryotherapy not available in [facility] clinic.  PCM may have available.  Discussed with patient 2-3  treatments may be required to eradicate wart. Exitcare handout on warts printed and given to patient. Patient notified to expect scab at treated area but if erythema, purulent drainage, red streak radiating up hand/thumb (signs of infection) to return to clinic for reevaluation. Patient agreed with plan of care, verbalized understanding of instructions/information and had no further questions at this time.

## 2020-01-23 ENCOUNTER — Encounter: Payer: Self-pay | Admitting: Registered Nurse

## 2020-01-23 ENCOUNTER — Other Ambulatory Visit: Payer: Self-pay

## 2020-01-23 ENCOUNTER — Ambulatory Visit: Payer: Self-pay | Admitting: Registered Nurse

## 2020-01-23 VITALS — BP 105/87 | HR 64 | Temp 98.7°F

## 2020-01-23 DIAGNOSIS — B079 Viral wart, unspecified: Secondary | ICD-10-CM

## 2020-01-23 NOTE — Progress Notes (Signed)
Subjective:    Patient ID: Olivia Hamilton, female    DOB: October 25, 1965, 54 y.o.   MRN: [id number]  54y/o caucasian established female here for reshaving of thumb wart.  Has been wearing duct tape over the top after healed from last shave.  Applied triple antibiotic and covered with bandaid until no longer sensitive to touch after shaving on 01/09/2020  Patient reported decreased pain but still noticing lump and pain with direct pressure over affected area denied discharge/redness.     Review of Systems  Constitutional: Negative for activity change, appetite change, chills, diaphoresis, fatigue and fever.  HENT: Negative for trouble swallowing and voice change.   Eyes: Negative for photophobia and visual disturbance.  Respiratory: Negative for cough, shortness of breath, wheezing and stridor.   Cardiovascular: Negative for palpitations.  Gastrointestinal: Negative for diarrhea, nausea and vomiting.  Endocrine: Negative for cold intolerance and heat intolerance.  Genitourinary: Negative for difficulty urinating.  Musculoskeletal: Positive for myalgias. Negative for arthralgias, back pain, gait problem, joint swelling, neck pain and neck stiffness.  Skin: Positive for rash. Negative for color change, pallor and wound.  Allergic/Immunologic: Positive for environmental allergies. Negative for food allergies.  Neurological: Negative for dizziness, tremors, seizures, syncope, facial asymmetry, speech difficulty, weakness, light-headedness, numbness and headaches.  Hematological: Negative for adenopathy. Does not bruise/bleed easily.  Psychiatric/Behavioral: Negative for agitation, confusion and sleep disturbance.       Objective:   Physical Exam Vitals and nursing note reviewed.  Constitutional:      General: She is awake. She is not in acute distress.    Appearance: Normal appearance. She is well-developed and well-groomed. She is obese. She is not ill-appearing, toxic-appearing or  diaphoretic.  HENT:     Head: Normocephalic and atraumatic.     Jaw: There is normal jaw occlusion.     Right Ear: Hearing and external ear normal.     Left Ear: Hearing and external ear normal.     Nose: Nose normal.     Mouth/Throat:     Mouth: No angioedema.     Pharynx: Oropharynx is clear.  Eyes:     General: Lids are normal. Vision grossly intact. Gaze aligned appropriately. Allergic shiner present. No visual field deficit or scleral icterus.       Right eye: No discharge.        Left eye: No discharge.     Extraocular Movements: Extraocular movements intact.     Conjunctiva/sclera: Conjunctivae normal.     Pupils: Pupils are equal, round, and reactive to light.  Neck:     Thyroid: No thyromegaly.     Trachea: Trachea normal.  Cardiovascular:     Rate and Rhythm: Normal rate and regular rhythm.     Pulses: Normal pulses.          Radial pulses are 2+ on the right side and 2+ on the left side.  Pulmonary:     Effort: Pulmonary effort is normal. No respiratory distress.     Breath sounds: Normal breath sounds and air entry. No stridor, decreased air movement or transmitted upper airway sounds. No decreased breath sounds, wheezing, rhonchi or rales.     Comments: Wearing cloth mask; spoke full sentences without difficulty; no cough observed in exam room Abdominal:     General: Abdomen is flat.  Musculoskeletal:        General: Tenderness present. No swelling, deformity or signs of injury. Normal range of motion.     Right shoulder: Normal.  Left shoulder: Normal.     Right elbow: Normal.     Left elbow: Normal.     Right hand: Tenderness present. No swelling, deformity, lacerations or bony tenderness. Normal range of motion. Normal strength. Normal sensation. There is no disruption of two-point discrimination. Normal capillary refill. Normal pulse.     Left hand: Normal.     Cervical back: Normal range of motion and neck supple. No edema, erythema, signs of trauma,  rigidity, torticollis or crepitus. No pain with movement. Normal range of motion.  Lymphadenopathy:     Head:     Right side of head: No submental, submandibular or preauricular adenopathy.     Left side of head: No submental, submandibular or preauricular adenopathy.     Cervical: No cervical adenopathy.     Right cervical: No superficial cervical adenopathy.    Left cervical: No superficial cervical adenopathy.  Skin:    General: Skin is warm and dry.     Capillary Refill: Capillary refill takes less than 2 seconds.     Coloration: Skin is not ashen, cyanotic, jaundiced, mottled, pale or sallow.     Findings: Lesion and rash present. No abrasion, abscess, acne, bruising, burn, ecchymosis, erythema, signs of injury, laceration, petechiae or wound. Rash is papular and scaling. Rash is not crusting, macular, nodular, purpuric, pustular, urticarial or vesicular.     Nails: There is no clubbing.          Comments: DIP/PIP/MCP joints without pain/erythema/swelling bilateral hands; Cleansed affected area with iodine then shaved right thumb lesion until pinpoint bleeding and slight tenderness; dressed with triple antibiotic and fingertip bandaid palmar right thumb distal phalanx after procedure   Neurological:     General: No focal deficit present.     Mental Status: She is alert and oriented to person, place, and time. Mental status is at baseline.     GCS: GCS eye subscore is 4. GCS verbal subscore is 5. GCS motor subscore is 6.     Cranial Nerves: Cranial nerves are intact. No cranial nerve deficit, dysarthria or facial asymmetry.     Sensory: Sensation is intact. No sensory deficit.     Motor: Motor function is intact. No weakness, tremor, atrophy, abnormal muscle tone or seizure activity.     Coordination: Coordination is intact. Coordination normal.     Gait: Gait is intact. Gait normal.     Comments: Gait sure and steady in clinic; on/off exam table and in/out of chair without difficulty;  bilateral hand grasp equal 5/5  Psychiatric:        Attention and Perception: Attention and perception normal.        Mood and Affect: Mood and affect normal.        Speech: Speech normal.        Behavior: Behavior normal. Behavior is cooperative.        Thought Content: Thought content normal.        Cognition and Memory: Cognition and memory normal.        Judgment: Judgment normal.           Assessment & Plan:  A-thumb wart subsequent encounter  P--Treated with shaving callous with #10 blade until pinpoint of blood noted and slight tenderness. Band-Aidand generic neosporin ointment UDapplied from clinic stock.May buy salicytic acid/compound W 2% apply TID starting tomorrow or use duct tape apply strip completely occluding affected area and leave on until 02/03/2020.Patient reported minimal pain after procedure. Discussed with patient to keep clean  and dry and follow up when she returns from vacation later this [employer]. Cryotherapy not available in [facility] clinic. PCM may have available. Discussed with patient 2-3 treatments may be required to eradicate wart. Exitcare handout on wartsprinted andgiven to patient at previous appt patient refused new printout. Patient notified to expect scab at treated area but if erythema, purulent drainage, red streak radiating up hand/thumb (signs of infection) to return to clinic for reevaluation.  Patient leaving Friday for vacation.   Patient agreed with plan of care,verbalized understanding of instructions/information and had no further questions at this time.

## 2020-01-23 NOTE — Patient Instructions (Signed)
Warts ° °Warts are small growths on the skin. They are common and can occur on many areas of the body. A person may have one wart or several warts. °In many cases, warts do not require treatment. They usually go away on their own over a period of many months to a few years. If needed, warts that cause problems or do not go away on their own can be treated. °What are the causes? °Warts are caused by a type of virus that is called human papillomavirus (HPV). °· This virus can spread from person to person through direct contact. °· Warts can also spread to other areas of the body when a person scratches a wart and then scratches another area of his or her body. °What increases the risk? °You are more likely to develop this condition if: °· You are 10-20 years old. °· You have a weakened body defense system (immune system). °· You are Caucasian. °What are the signs or symptoms? °The main symptom of this condition is small growths on the skin. Warts may: °· Be round or oval or have an irregular shape. °· Have a rough surface. °· Range in color from skin color to light yellow, brown, or gray. °· Generally be less than ½ inch (1.3 cm) in size. °· Go away and then come back again. °Most warts are painless, but some can be painful if they are large or occur in an area of the body where pressure will be applied to them, such as the bottom of the foot. °How is this diagnosed? °A wart can usually be diagnosed based on its appearance. In some cases, a tissue sample may be removed (biopsy) to be looked at under a microscope. °How is this treated? °In many cases, warts do not need treatment. Sometimes treatment is desired. If treatment is needed or desired, options may include: °· Applying medicated solutions, creams, or patches to the wart. These may be over-the-counter or prescription medicines that make the skin soft so that layers will gradually shed away. In many cases, the medicine is applied one or two times per day and  covered with a bandage. °· Putting duct tape over the top of the wart (occlusion). You will leave the tape in place for as long as told by your health care provider and then replace it with a new strip of tape. This is done until the wart goes away. °· Freezing the wart with liquid nitrogen (cryotherapy). °· Burning the wart with: °? Laser treatment. °? An electrified probe (electrocautery). °· Injection of a medicine (Candida antigen) into the wart to help the body's immune system fight off the wart. °· Surgery to remove the wart. °Follow these instructions at home: °Medicines °· Apply over-the-counter and prescription medicines only as told by your health care provider. °· Do not apply over-the-counter wart medicines to your face or genitals unless your health care provider tells you to do that. °Lifestyle °· Keep your immune system healthy. To do this: °? Eat a healthy, balanced diet. °? Get enough sleep. °? Do not use any products that contain nicotine or tobacco, such as cigarettes and e-cigarettes. If you need help quitting, ask your health care provider. °General instructions ° °· Wash your hands after you touch a wart. °· Do not scratch or pick at a wart. °· Avoid shaving hair that is over a wart. °· Keep all follow-up visits as told by your health care provider. This is important. °Contact a health care provider if: °·   Your warts do not improve after treatment. °· You have redness, swelling, or pain at the site of a wart. °· You have bleeding from a wart that does not stop with light pressure. °· You have diabetes and you develop a wart. °Summary °· Warts are small growths on the skin. They are common and can occur on many areas of the body. °· In many cases, warts do not need treatment. Sometimes treatment is desired. If treatment is needed or desired, there are several treatment options. °· Apply over-the-counter and prescription medicines only as told by your health care provider. °· Wash your hands  after you touch a wart. °· Keep all follow-up visits as told by your health care provider. This is important. °This information is not intended to replace advice given to you by your health care provider. Make sure you discuss any questions you have with your health care provider. °Document Revised: 01/11/2018 Document Reviewed: 01/11/2018 °Elsevier Patient Education © 2020 [employer] ° °

## 2020-02-20 ENCOUNTER — Ambulatory Visit: Payer: Self-pay | Admitting: Registered Nurse

## 2020-02-20 ENCOUNTER — Encounter: Payer: Self-pay | Admitting: Registered Nurse

## 2020-02-20 ENCOUNTER — Other Ambulatory Visit: Payer: Self-pay

## 2020-02-20 VITALS — BP 118/90 | HR 66 | Temp 98.5°F

## 2020-02-20 DIAGNOSIS — S39012A Strain of muscle, fascia and tendon of lower back, initial encounter: Secondary | ICD-10-CM

## 2020-02-20 MED ORDER — IBUPROFEN 800 MG PO TABS
800.0000 mg | ORAL_TABLET | Freq: Three times a day (TID) | ORAL | 0 refills | Status: DC
Start: 2020-02-20 — End: 2021-02-19

## 2020-02-20 MED ORDER — BIOFREEZE 4 % EX GEL: 1.0000 "application " | Freq: Four times a day (QID) | CUTANEOUS | Status: AC | PRN

## 2020-02-20 NOTE — Patient Instructions (Addendum)
Low Back Sprain or Strain Rehab Ask your health care provider which exercises are safe for you. Do exercises exactly as told by your health care provider and adjust them as directed. It is normal to feel mild stretching, pulling, tightness, or discomfort as you do these exercises. Stop right away if you feel sudden pain or your pain gets worse. Do not begin these exercises until told by your health care provider. Stretching and range-of-motion exercises These exercises warm up your muscles and joints and improve the movement and flexibility of your back. These exercises also help to relieve pain, numbness, and tingling. Lumbar rotation  1. Lie on your back on a firm surface and bend your knees. 2. Straighten your arms out to your sides so each arm forms a 90-degree angle (right angle) with a side of your body. 3. Slowly move (rotate) both of your knees to one side of your body until you feel a stretch in your lower back (lumbar). Try not to let your shoulders lift off the floor. 4. Hold this position for ___5-15_______ seconds. 5. Tense your abdominal muscles and slowly move your knees back to the starting position. 6. Repeat this exercise on the other side of your body. Repeat _____3_____ times. Complete this exercise _____3_____ times a day. Single knee to chest  1. Lie on your back on a firm surface with both legs straight. 2. Bend one of your knees. Use your hands to move your knee up toward your chest until you feel a gentle stretch in your lower back and buttock. ? Hold your leg in this position by holding on to the front of your knee. ? Keep your other leg as straight as possible. 3. Hold this position for __5-15________ seconds. 4. Slowly return to the starting position. 5. Repeat with your other leg. Repeat ____3______ times. Complete this exercise ____3______ times a day. Prone extension on elbows  1. Lie on your abdomen on a firm surface (prone position). 2. Prop yourself up on  your elbows. 3. Use your arms to help lift your chest up until you feel a gentle stretch in your abdomen and your lower back. ? This will place some of your body weight on your elbows. If this is uncomfortable, try stacking pillows under your chest. ? Your hips should stay down, against the surface that you are lying on. Keep your hip and back muscles relaxed. 4. Hold this position for ____5-15______ seconds. 5. Slowly relax your upper body and return to the starting position. Repeat ____3______ times. Complete this exercise ____3______ times a day. Strengthening exercises These exercises build strength and endurance in your back. Endurance is the ability to use your muscles for a long time, even after they get tired. Pelvic tilt This exercise strengthens the muscles that lie deep in the abdomen. 1. Lie on your back on a firm surface. Bend your knees and keep your feet flat on the floor. 2. Tense your abdominal muscles. Tip your pelvis up toward the ceiling and flatten your lower back into the floor. ? To help with this exercise, you may place a small towel under your lower back and try to push your back into the towel. 3. Hold this position for __5-15________ seconds. 4. Let your muscles relax completely before you repeat this exercise. Repeat ____3______ times. Complete this exercise _____3_____ times a day. Alternating arm and leg raises  1. Get on your hands and knees on a firm surface. If you are on a hard floor, you  may want to use padding, such as an exercise mat, to cushion your knees. 2. Line up your arms and legs. Your hands should be directly below your shoulders, and your knees should be directly below your hips. 3. Lift your left leg behind you. At the same time, raise your right arm and straighten it in front of you. ? Do not lift your leg higher than your hip. ? Do not lift your arm higher than your shoulder. ? Keep your abdominal and back muscles tight. ? Keep your hips  facing the ground. ? Do not arch your back. ? Keep your balance carefully, and do not hold your breath. 4. Hold this position for __5-15________ seconds. 5. Slowly return to the starting position. 6. Repeat with your right leg and your left arm. Repeat ____3______ times. Complete this exercise _____3_____ times a day. Abdominal set with straight leg raise  1. Lie on your back on a firm surface. 2. Bend one of your knees and keep your other leg straight. 3. Tense your abdominal muscles and lift your straight leg up, 4-6 inches (10-15 cm) off the ground. 4. Keep your abdominal muscles tight and hold this position for _5-15_________ seconds. ? Do not hold your breath. ? Do not arch your back. Keep it flat against the ground. 5. Keep your abdominal muscles tense as you slowly lower your leg back to the starting position. 6. Repeat with your other leg. Repeat ____3______ times. Complete this exercise _____3_____ times a day. Single leg lower with bent knees 1. Lie on your back on a firm surface. 2. Tense your abdominal muscles and lift your feet off the floor, one foot at a time, so your knees and hips are bent in 90-degree angles (right angles). ? Your knees should be over your hips and your lower legs should be parallel to the floor. 3. Keeping your abdominal muscles tense and your knee bent, slowly lower one of your legs so your toe touches the ground. 4. Lift your leg back up to return to the starting position. ? Do not hold your breath. ? Do not let your back arch. Keep your back flat against the ground. 5. Repeat with your other leg. Repeat ___3_______ times. Complete this exercise ____3______ times a day. Posture and body mechanics Good posture and healthy body mechanics can help to relieve stress in your body's tissues and joints. Body mechanics refers to the movements and positions of your body while you do your daily activities. Posture is part of body mechanics. Good posture  means:  Your spine is in its natural S-curve position (neutral).  Your shoulders are pulled back slightly.  Your head is not tipped forward. Follow these guidelines to improve your posture and body mechanics in your everyday activities. Standing   When standing, keep your spine neutral and your feet about hip width apart. Keep a slight bend in your knees. Your ears, shoulders, and hips should line up.  When you do a task in which you stand in one place for a long time, place one foot up on a stable object that is 2-4 inches (5-10 cm) high, such as a footstool. This helps keep your spine neutral. Sitting   When sitting, keep your spine neutral and keep your feet flat on the floor. Use a footrest, if necessary, and keep your thighs parallel to the floor. Avoid rounding your shoulders, and avoid tilting your head forward.  When working at a desk or a Animatorcomputer, keep your desk  at a height where your hands are slightly lower than your elbows. Slide your chair under your desk so you are close enough to maintain good posture.  When working at a computer, place your monitor at a height where you are looking straight ahead and you do not have to tilt your head forward or downward to look at the screen. Resting  When lying down and resting, avoid positions that are most painful for you.  If you have pain with activities such as sitting, bending, stooping, or squatting, lie in a position in which your body does not bend very much. For example, avoid curling up on your side with your arms and knees near your chest (fetal position).  If you have pain with activities such as standing for a long time or reaching with your arms, lie with your spine in a neutral position and bend your knees slightly. Try the following positions: ? Lying on your side with a pillow between your knees. ? Lying on your back with a pillow under your knees. Lifting   When lifting objects, keep your feet at least shoulder  width apart and tighten your abdominal muscles.  Bend your knees and hips and keep your spine neutral. It is important to lift using the strength of your legs, not your back. Do not lock your knees straight out.  Always ask for help to lift heavy or awkward objects. This information is not intended to replace advice given to you by your health care provider. Make sure you discuss any questions you have with your health care provider. Document Revised: 12/16/2018 Document Reviewed: 09/15/2018 Elsevier Patient Education  2020 [employer] Lumbar Strain A lumbar strain, which is sometimes called a low-back strain, is a stretch or tear in a muscle or the strong cords of tissue that attach muscle to bone (tendons) in the lower back (lumbar spine). This type of injury occurs when muscles or tendons are torn or are stretched beyond their limits. Lumbar strains can range from mild to severe. Mild strains may involve stretching a muscle or tendon without tearing it. These may heal in 1-2 weeks. More severe strains involve tearing of muscle fibers or tendons. These will cause more pain and may take 6-8 weeks to heal. What are the causes? This condition may be caused by:  Trauma, such as a fall or a hit to the body.  Twisting or overstretching the back. This may result from doing activities that need a lot of energy, such as lifting heavy objects. What increases the risk? This injury is more common in:  Athletes.  People with obesity.  People who do repeated lifting, bending, or other movements that involve their back. What are the signs or symptoms? Symptoms of this condition may include:  Sharp or dull pain in the lower back that does not go away. The pain may extend to the buttocks.  Stiffness or limited range of motion.  Sudden muscle tightening (spasms). How is this diagnosed? This condition may be diagnosed based on:  Your symptoms.  Your medical history.  A physical  exam.  Imaging tests, such as: ? X-rays. ? MRI. How is this treated? Treatment for this condition may include:  Rest.  Applying heat and cold to the affected area.  Over-the-counter medicines to help relieve pain and inflammation, such as NSAIDs.  Prescription pain medicine and muscle relaxants may be needed for a short time.  Physical therapy. Follow these instructions at home: Managing pain, stiffness, and swelling  If directed, put ice on the injured area during the first 24 hours after your injury. ? Put ice in a plastic bag. ? Place a towel between your skin and the bag. ? Leave the ice on for 20 minutes, 2-3 times a day.  If directed, apply heat to the affected area as often as told by your health care provider. Use the heat source that your health care provider recommends, such as a moist heat pack or a heating pad. ? Place a towel between your skin and the heat source. ? Leave the heat on for 20-30 minutes. ? Remove the heat if your skin turns bright red. This is especially important if you are unable to feel pain, heat, or cold. You may have a greater risk of getting burned. Activity  Rest and return to your normal activities as told by your health care provider. Ask your health care provider what activities are safe for you.  Do exercises as told by your health care provider. Medicines  Take over-the-counter and prescription medicines only as told by your health care provider.  Ask your health care provider if the medicine prescribed to you: ? Requires you to avoid driving or using heavy machinery. ? Can cause constipation. You may need to take these actions to prevent or treat constipation:  Drink enough fluid to keep your urine pale yellow.  Take over-the-counter or prescription medicines.  Eat foods that are high in fiber, such as beans, whole grains, and fresh fruits and vegetables.  Limit foods that are high in fat and processed sugars, such as  fried or sweet foods. Injury prevention To prevent a future low-back injury:  Always warm up properly before physical activity or sports.  Cool down and stretch after being active.  Use correct form when playing sports and lifting heavy objects. Bend your knees before you lift heavy objects.  Use good posture when sitting and standing.  Stay physically fit and keep a healthy weight. ? Do at least 150 minutes of moderate-intensity exercise each week, such as brisk walking or water aerobics. ? Do strength exercises at least 2 times each week.  General instructions  Do not use any products that contain nicotine or tobacco, such as cigarettes, e-cigarettes, and chewing tobacco. If you need help quitting, ask your health care provider.  Keep all follow-up visits as told by your health care provider. This is important. Contact a health care provider if:  Your back pain does not improve after 6 weeks of treatment.  Your symptoms get worse. Get help right away if:  Your back pain is severe.  You are unable to stand or walk.  You develop pain in your legs.  You develop weakness in your buttocks or legs.  You have difficulty controlling when you urinate or when you have a bowel movement. ? You have frequent, painful, or bloody urination. ? You have a temperature over 101.40F (38.3C) Summary  A lumbar strain, which is sometimes called a low-back strain, is a stretch or tear in a muscle or the strong cords of tissue that attach muscle to bone (tendons) in the lower back (lumbar spine).  This type of injury occurs when muscles or tendons are torn or are stretched beyond their limits.  Rest and return to your normal activities as told by your health care provider. If directed, apply heat and ice to the affected area as often as told by your health care provider.  Take over-the-counter and prescription medicines only as  told by your health care provider.  Contact a health care  provider if you have new or worsening symptoms. This information is not intended to replace advice given to you by your health care provider. Make sure you discuss any questions you have with your health care provider. Document Revised: 06/23/2018 Document Reviewed: 06/23/2018 Elsevier Patient Education  [place].

## 2020-02-20 NOTE — Progress Notes (Signed)
Subjective:    Patient ID: Olivia Hamilton, female    DOB: July 31, 1966, 54 y.o.   MRN: [id number]  53y/o Caucasian established female pt c/o central lower back pain since yesterday. Worsened today especially after sitting for long periods of time. Feels like a "catch" in her back. Feels like she has to move slow for certain position changes like sitting to standing, standing to sitting and bending over to put on shoes. Endorses working in the yard 2 days ago carried plantar that was large in size and awkward to carry. Took sinus headache medicine this morning and the pain relief in that has dulled the pain some currently.  Denied loss of bowel/bladder control, saddle paresthesias and/or arm/leg weakness.  Denied trauma/falls.  Typically takes motrin has 200mg  at home for prn use.    Unsure if she should apply heat or ice would like advice on care of back pain.     Review of Systems  Constitutional: Positive for activity change. Negative for appetite change, chills, diaphoresis, fatigue and fever.  HENT: Positive for sinus pressure and sinus pain. Negative for trouble swallowing and voice change.   Eyes: Negative for photophobia and visual disturbance.  Respiratory: Negative for cough, shortness of breath, wheezing and stridor.   Cardiovascular: Negative for leg swelling.  Gastrointestinal: Negative for diarrhea, nausea and vomiting.  Endocrine: Negative for cold intolerance and heat intolerance.  Genitourinary: Negative for difficulty urinating, dysuria and enuresis.  Musculoskeletal: Positive for back pain and myalgias. Negative for arthralgias, gait problem, joint swelling, neck pain and neck stiffness.  Skin: Negative for color change and rash.  Allergic/Immunologic: Positive for environmental allergies. Negative for food allergies.  Neurological: Positive for headaches. Negative for dizziness, tremors, seizures, syncope, facial asymmetry, speech difficulty, weakness, light-headedness and  numbness.  Hematological: Negative for adenopathy. Does not bruise/bleed easily.  Psychiatric/Behavioral: Negative for agitation, confusion and sleep disturbance.       Objective:   Physical Exam Vitals and nursing note reviewed.  Constitutional:      General: She is awake. She is not in acute distress.    Appearance: Normal appearance. She is well-developed and well-groomed. She is obese. She is not ill-appearing, toxic-appearing or diaphoretic.  HENT:     Head: Normocephalic and atraumatic.     Jaw: There is normal jaw occlusion.     Salivary Glands: Right salivary gland is not diffusely enlarged or tender. Left salivary gland is not diffusely enlarged or tender.     Right Ear: Hearing and external ear normal.     Left Ear: Hearing and external ear normal.     Nose: Nose normal.     Mouth/Throat:     Mouth: No angioedema.     Pharynx: Oropharynx is clear.  Eyes:     General: Lids are normal. Vision grossly intact. Gaze aligned appropriately. Allergic shiner present. No visual field deficit or scleral icterus.       Right eye: No discharge.        Left eye: No discharge.     Extraocular Movements: Extraocular movements intact.     Conjunctiva/sclera: Conjunctivae normal.     Pupils: Pupils are equal, round, and reactive to light.  Neck:     Thyroid: No thyromegaly.     Vascular: No JVD.     Trachea: Trachea and phonation normal. No tracheal deviation.  Cardiovascular:     Rate and Rhythm: Normal rate and regular rhythm.     Pulses: Normal pulses.  Radial pulses are 2+ on the right side and 2+ on the left side.     Heart sounds: Normal heart sounds. No murmur heard.   Pulmonary:     Effort: Pulmonary effort is normal. No respiratory distress.     Breath sounds: Normal breath sounds and air entry. No stridor, decreased air movement or transmitted upper airway sounds. No decreased breath sounds, wheezing, rhonchi or rales.     Comments: Wearing cloth mask due to covid  19 pandemic; spoke full sentences without difficulty; no cough observed in exam room Abdominal:     General: Abdomen is flat.     Palpations: Abdomen is soft.     Tenderness: There is no abdominal tenderness.  Musculoskeletal:        General: No swelling, tenderness or deformity.     Right shoulder: Normal.     Left shoulder: Normal.     Right elbow: Normal.     Left elbow: Normal.     Right hand: Normal.     Left hand: Normal.     Cervical back: Normal, normal range of motion and neck supple. No swelling, edema, deformity, erythema, signs of trauma, lacerations, rigidity, spasms, torticollis, tenderness, bony tenderness or crepitus. No pain with movement or spinous process tenderness. Normal range of motion.     Thoracic back: Normal. No swelling, edema, deformity, signs of trauma, lacerations, spasms, tenderness or bony tenderness. Normal range of motion. No scoliosis.     Lumbar back: No swelling, edema, deformity, signs of trauma, lacerations, spasms, tenderness or bony tenderness. Decreased range of motion. Negative right straight leg raise test and negative left straight leg raise test. No scoliosis.       Back:     Right hip: Normal. No deformity, lacerations, tenderness, bony tenderness or crepitus. Normal range of motion. Normal strength.     Left hip: Normal. No deformity, lacerations, tenderness, bony tenderness or crepitus. Normal range of motion. Normal strength.     Right upper leg: Normal. No swelling, edema, deformity, lacerations, tenderness or bony tenderness.     Left upper leg: Normal. No swelling, edema, deformity, lacerations, tenderness or bony tenderness.     Right knee: Normal.     Left knee: Normal.     Right lower leg: Normal. No edema.     Left lower leg: Normal. No edema.     Right ankle: Normal.     Left ankle: Normal.     Comments: Slow to lie supine on exam table and sit up from supine position but no assist needed.  Normal heel toe gait; bilateral hand  grasp and lower extremity strength 5/5; no trigger point or muscle spasms noted; SI joints not TTP; negative bilateral straight leg raise right right lower hurts more and no change in pain with left.  Lymphadenopathy:     Head:     Right side of head: No submental, submandibular or preauricular adenopathy.     Left side of head: No submental, submandibular or preauricular adenopathy.     Cervical: No cervical adenopathy.     Right cervical: No superficial or posterior cervical adenopathy.    Left cervical: No superficial or posterior cervical adenopathy.  Skin:    General: Skin is warm and dry.     Capillary Refill: Capillary refill takes less than 2 seconds.     Coloration: Skin is not ashen, cyanotic, jaundiced, mottled, pale or sallow.     Findings: No abrasion, abscess, acne, bruising, burn, ecchymosis, erythema,  signs of injury, laceration, lesion, petechiae, rash or wound.     Nails: There is no clubbing.  Neurological:     General: No focal deficit present.     Mental Status: She is alert and oriented to person, place, and time. Mental status is at baseline. She is not disoriented.     GCS: GCS eye subscore is 4. GCS verbal subscore is 5. GCS motor subscore is 6.     Cranial Nerves: Cranial nerves are intact. No cranial nerve deficit, dysarthria or facial asymmetry.     Sensory: Sensation is intact. No sensory deficit.     Motor: Motor function is intact. No weakness, tremor, atrophy, abnormal muscle tone or seizure activity.     Coordination: Coordination is intact. Coordination normal.     Gait: Gait is intact. Gait normal.     Deep Tendon Reflexes: Reflexes normal.     Reflex Scores:      Brachioradialis reflexes are 2+ on the right side and 2+ on the left side.      Patellar reflexes are 1+ on the right side and 1+ on the left side.    Comments: Strength 5/5 bilaterally arms/legs; in/out of chair without difficulty  Psychiatric:        Attention and Perception: Attention and  perception normal. She is attentive.        Mood and Affect: Mood and affect normal.        Speech: Speech normal.        Behavior: Behavior normal. Behavior is cooperative.        Thought Content: Thought content normal.        Cognition and Memory: Cognition and memory normal.        Judgment: Judgment normal.           Assessment & Plan:  A-low back strain initial encounter  P-Ibuprofen 800mg  po TID prn pain has 200mg  at home discussed take with food to avoid gastritis.  Discussed day 2-4 typically worst symptoms.  Home stretches demonstrated to patient-e.g. thoracic/lumbar AROM, knee to chest, superman stretch alternate legs/arms, pelvic bridge, cobra and rock side to side on back. Self massage or professional prn, foam roller use or tennis/racquetball.  Heat/cryotherapy 15 minutes QID prn.  Trial thermacare 1 applied and another given to patient for use tomorrow from clinic stock. biofreeze gel may apply QID prn pain given 4 UD from clinic stock.   Consider physical therapy referral if no improvement with prescribed therapy from [facility] and/or chiropractic care.  Ensure ergonomics correct desk at work avoid repetitive motions if possible/holding phone/laptop in hand use desk/stand and/or break up lifting items into smaller loads/weights.  Patient was instructed to rest, ice, and ROM exercises.  Activity as tolerated. Discussed to stretch hourly as prolonged sitting/lying can worsen pain.  Follow up if symptoms persist or worsen especially if loss of bowel/bladder control, arm/leg weakness and/or saddle paresthesias.  Exitcare handout on low back strain and rehab exercises given to patient.  Patient verbalized agreement and understanding of treatment plan and had no further questions at this time.  P2:  Injury Prevention and Fitness.

## 2020-03-21 LAB — HM DEXA SCAN: HM Dexa Scan: NORMAL

## 2020-03-21 LAB — RESULTS CONSOLE HPV: CHL HPV: NEGATIVE

## 2020-03-22 LAB — HM PAP SMEAR

## 2020-05-21 ENCOUNTER — Telehealth: Payer: Self-pay | Admitting: *Deleted

## 2020-05-21 DIAGNOSIS — R197 Diarrhea, unspecified: Secondary | ICD-10-CM

## 2020-05-21 DIAGNOSIS — R519 Headache, unspecified: Secondary | ICD-10-CM

## 2020-05-21 DIAGNOSIS — Z20822 Contact with and (suspected) exposure to covid-19: Secondary | ICD-10-CM

## 2020-05-21 NOTE — Telephone Encounter (Signed)
Per HR, pt to complete testing on Day 5 and if negative result, may RTW 7 days after testing. RTW (next scheduled workday after 7 days) Monday 9/27.  Pt made aware of testing appt info, [facility] infusion center referral. She is going to speak with her doctor about the infusions. Advised her she can decline when center calls if she and her doctor decide against MAB therapy.

## 2020-05-21 NOTE — Telephone Encounter (Signed)
Pt and RN notified by coworker of positive covid test today. Pt had lunch with coworker yesterday. Pt unvaccinated. Asymptomatic. Sent home to work remote.  Pending decision from HR on possible protocols to follow: 14 day quarantine with testing Day 3-5 after exposure.  OR Test on Day 5 (or later) after exposure and quarantine 7 additional days after test date if negative result.  Pt will RTW 9/29 if 14 days, 9/27 if secondary protocol followed. Pt scheduled for testing at [facility] 9/18 at 1100.  Referral also sent to Outpatient [facility] infusion center as pt is unvaccinated due to pcp recommendation r/t an autoimmune disease.

## 2020-05-21 NOTE — Telephone Encounter (Signed)
Noted patient notified SQ monoclonal antibodies available through [facility] clinic ([facility]) in [place] by RN Rolly Salter after discussion with me patient case via telephone.  She meets criteria due to close contact yesterday to receive therapy but wants to discuss with her PCM.  Testing covid PCR scheduled currently asymptomatic and started on home quarantine this afternoon/working remotely.  HR Tim notified by [employer].

## 2020-05-22 NOTE — Telephone Encounter (Signed)
Contacted patient via telephone.  Verified still no symptoms but that she had eaten at [facility] sitting across from now known covid positive/symptomatic patient on Monday 05/20/2020.  Patient not vaccinated.  She discussed monoclonal antibodies with her PCM and decided against SQ treatment at this time.  Still is quarantining/working remotely from home for 14 days and monitoring for covid symptoms.  Patient notified she can contact me via [email or web address] if further questions or concerns.  Patient verbalized understanding information/instructions, agreed with plan of care and had no further questions at this time.

## 2020-05-23 NOTE — Telephone Encounter (Signed)
Noted agreed with plan of care and covid testing.  Will follow up with patient via telephone tomorrow.  Patient also instructed by RN Rolly Salter she can contact me via [email or web address] if questions or concerns as clinic closed tomorrow and RN Rolly Salter returns Monday.

## 2020-05-23 NOTE — Telephone Encounter (Signed)
Spoke with pt over phone. She reports HA that started last night. Congestion "at base of throat, doesn't feel like it's deep in my chest." Denies PND, nasal congestion. +sinus pressure on L side of face, frontal and maxillary, with pressure behind L side. Also c/o mental fog. Feels better laying flat.  Discussed OTC meds of nasal saline spray and rinse, phenylephrine, mucinex, tylenol/motrin. Discussed monoclonal antibody infusion again if pt's 9/18 test returns positive.  Now that symptoms have started, adjusting pt's quarantine to 10 day symptomatic with Day 1 as today 9/16, Day 10 9/25 and RTW next scheduled workday of 9/27. HR made aware of same.

## 2020-05-24 ENCOUNTER — Encounter: Payer: Self-pay | Admitting: *Deleted

## 2020-05-24 NOTE — Telephone Encounter (Signed)
Attempted to reach patient via telephone left patient message that I would call her again tomorrow and if further questions/concerns she could reply to [employer] message I sent or email [email or web address]

## 2020-05-24 NOTE — Telephone Encounter (Signed)
Telephone message left for patient I would try to contact her again later today as I received message she was wondering if any OTC nose sprays or medicine/mucinex she can try.  Nasal saline 2 sprays each nostril q2h wa, flonase 1 spray each nostril BID for rhinitis/congestion.  guaifenisan 600mg  po BID prn chest congestion.  Continue claritin 10mg  po daily. Consider bromphed DM has used in the past along with phenylephrine 5mg  plain only for rhinitis/congestion.  My chart message sent to patient.

## 2020-05-25 NOTE — Telephone Encounter (Signed)
Message left for patient checking in may email [email or web address]  I will try again tomorrow.

## 2020-05-26 NOTE — Telephone Encounter (Signed)
Patient contacted via telephone reported headache resolved.  Still having some fatigue and upper chest tightness.  Dog keeping her up at night so not sleeping well at night.  Diarrhea x 3 in past 24 hours.  Saturday 05/25/2020 worst day for diarrhea.  Test completed yesterday; continuing quarantine still waiting for results.  Patient had reported she has been sleeping more than usual and each time I called she was charging her phone.  Patient thinks she will try to work from home tomorrow but needs to get her dog a vet appt as cancelled last week when she started quarantine.  Discussed RN Rolly Salter in the office tomorrow and will call her.  Discussed 14 day quarantine work from home due to known covid exposure expected RTW 27 Sep unless test positive then RTW 25 Sep.  Patient asked if other viral illnesses in community and discussed that there was.  Discussed to drink 1 cup fluid noncaffeinated per hour while awake to avoid dehydration with diarrhea.  Consider imodium if worsening again patient reported she feels like diarrhea easing today.  Denied fever/chills/dyspnea/wheezing/sob.  Encouraged patient to power nap since didn't get good sleep last night.  Patient verbalized understanding information/instructions, agreed with plan of care and had no further questions at this time.  A&Ox3 respirations even and unlabored room air and no cough audible during telephone conversation duration 11 minutes.

## 2020-05-27 NOTE — Telephone Encounter (Signed)
Noted diarrhea continues 1-2 episodes per day.  URI symptoms improving.  Will follow up with patient tomorrow when in clinic virtual visit with RN or myself.

## 2020-05-27 NOTE — Telephone Encounter (Signed)
Spoke with pt by phone this afternoon. Sts she only has diarrhea currently. 1-2 episodes today. All other sx have resolved except rhinorrhea present now. Expected since sinus pressure and inflammation is reduced. Questioned again if other viral illnesses in community with similar sx. Explained that plenty of other URI and GI viruses being seen in community clinics. Questioning how she could be so close to a positive person and not test positive. Advised her that the positive person's viral load may have been too low at that time (a day prior to sx) to easily pass infection. She denies any further questions/concerns. Reminded to contact the clinic if any changes. Otherwise, complete 10 day quarantine that ends on 9/25. RTW next scheduled workday 06/03/20.

## 2020-05-29 ENCOUNTER — Telehealth: Payer: Self-pay | Admitting: Registered Nurse

## 2020-05-29 ENCOUNTER — Encounter: Payer: Self-pay | Admitting: Registered Nurse

## 2020-05-29 DIAGNOSIS — J301 Allergic rhinitis due to pollen: Secondary | ICD-10-CM

## 2020-05-29 MED ORDER — SALINE SPRAY 0.65 % NA SOLN: 2.0000 | NASAL | 0 refills | Status: DC

## 2020-05-29 MED ORDER — FLUTICASONE PROPIONATE 50 MCG/ACT NA SUSP: 1.0000 | Freq: Two times a day (BID) | NASAL | 3 refills | Status: DC | PRN

## 2020-05-29 NOTE — Telephone Encounter (Signed)
Spoke with patient via telephone.  Woke up with headache today.  Took 1 motrin and tylenol sinus.  Thunderstorms in area yesterday and today discussed barometric pressure changes could be contributing to headache.  Patient has been taking more frequent breaks and working from home.  Sleeping okay.  Had loose stools until yesterday; none yet today.  Eating and drinking without difficulty.  Cough last night productive.  None today.  Still using nasal saline and flonase.  Patient asked if new Rx could be sent to [facility] for her flonase today also 3 month supply requested at a time.  A&Ox3 respirations even and unlabored no cough or nasal/throat clearing noted during 5 minute telephone conversation.  Continue 14 day quarantine.  Electronic Rx fluticasone nasal spray 1 spray each nostril BID # [id number] RF3 sent to her pharmacy of choice.  Patient verbalized understanding information/instructions, agreed with plan of care and had no further questions at this time.

## 2020-05-29 NOTE — Telephone Encounter (Signed)
Patient requested 3 month supply refill of flonase [facility] notified her needs new Rx.  Electronic Rx sent to her pharmacy of choice [facility] today fluticasone nasal spray 1 spray each nostril BID #33ml RF3.  Patient notified via telephone Rx sent to day.  Patient verbalized understanding information/instructions, agreed with plan of care and had no further questions at this time.

## 2020-05-31 NOTE — Telephone Encounter (Signed)
Attempted to call pt this morning for sx update. No answer. LVMRCB.

## 2020-05-31 NOTE — Telephone Encounter (Signed)
Noted RN Rolly Salter attempted to reach patient left message

## 2020-06-02 NOTE — Telephone Encounter (Signed)
Telephone message left for patient checking in to see how her weekend went.  She may email me at [email or web address]

## 2020-06-03 NOTE — Telephone Encounter (Signed)
Pt was back at work on-site today 9/27.

## 2020-06-04 NOTE — Telephone Encounter (Signed)
Noted symptoms improved and patient RTW

## 2020-06-18 ENCOUNTER — Ambulatory Visit: Payer: Self-pay | Admitting: *Deleted

## 2020-06-18 ENCOUNTER — Other Ambulatory Visit: Payer: Self-pay

## 2020-06-18 VITALS — BP 141/106 | HR 74

## 2020-06-18 DIAGNOSIS — Z0130 Encounter for examination of blood pressure without abnormal findings: Secondary | ICD-10-CM

## 2020-06-18 NOTE — Progress Notes (Signed)
BP check per pt request. Stressful morning for pt. Believes r/t that. Feels like heart racing but pulse normal. Denies vision changes, HA, etc.

## 2020-06-21 ENCOUNTER — Other Ambulatory Visit: Payer: Self-pay

## 2020-06-21 ENCOUNTER — Ambulatory Visit: Payer: Self-pay | Admitting: *Deleted

## 2020-06-21 VITALS — BP 150/96

## 2020-06-21 DIAGNOSIS — Z0130 Encounter for examination of blood pressure without abnormal findings: Secondary | ICD-10-CM

## 2020-06-21 DIAGNOSIS — R030 Elevated blood-pressure reading, without diagnosis of hypertension: Secondary | ICD-10-CM

## 2020-06-21 NOTE — Progress Notes (Signed)
Routine BP check per pt request. Some milder palpitations compared to last week. Denies vision changes, HA. Has new pt appt with [facility] end of November.

## 2020-06-25 ENCOUNTER — Encounter: Payer: Self-pay | Admitting: *Deleted

## 2020-06-25 ENCOUNTER — Telehealth: Payer: Self-pay | Admitting: *Deleted

## 2020-06-25 DIAGNOSIS — R030 Elevated blood-pressure reading, without diagnosis of hypertension: Secondary | ICD-10-CM

## 2020-06-25 DIAGNOSIS — E038 Other specified hypothyroidism: Secondary | ICD-10-CM

## 2020-06-25 DIAGNOSIS — E063 Autoimmune thyroiditis: Secondary | ICD-10-CM

## 2020-06-25 MED ORDER — SPIRONOLACTONE 25 MG PO TABS: 25.0000 mg | ORAL_TABLET | Freq: Every day | ORAL | 0 refills | Status: DC

## 2020-06-25 NOTE — Telephone Encounter (Signed)
Pt working from home today. Spoke over phone. BP elevated last few weeks. Checking in the clinic. She was on Spironolactone in her 40's for HTN. She lost weight and med was discontinued. +Vision changes- blurry +Heart racing, palpitations +HA but these can be chronic for her, so unsure if r/t BP. She wants to know if Spironolactone can be restarted while she waits to see new PCP. Hx Hashimotos.  Does not have endocrinologist. Is amenable to going. Going to look up accepted providers through [employer] and notify RN of preferred referral.  Case discussed with NP and orders received for endocrinology referral, Spironolactone for one month until f/u. BMP and TSH on 10/22 after starting med today.  [facility] preferred pharmacy.

## 2020-06-25 NOTE — Telephone Encounter (Signed)
Noted reviewed RN Rolly Salter note.  Case discussed with RN Rolly Salter in clinic today and reviewed [facility] system for patient results online noted to have + thyroid antibodies in 200s and TSH below detectable earlier this year.  Patient possibly overcorrected with levothyroxine or gland possibly storming.  Patient on levothyroxine daily per med list.  Patient offsite today unable to get labs, clinic closed tomorrow no staff onsite.  Will draw BMET and TSH together  By Johnna Acosta 10/22 as long as patient returned to onsite work.  TSH repeat to see if any change in levels consider decreasing levothyroxine to po daily if TSH still less than 0.450  She stated her current PCM was happy with her levels last blood draw and stated no medication changes needed.   Noted in epic patient previously on armour 300mg  po daily 2018 prior to levothyroxine and has not seen endocrinology.    Symptoms of hyperthyroidism vision changes, anxiety & palpitations.  Had acute illness URI/diarrhea/headache week of 05/21/2020 and completed 14 day home quarantine and negative covid test as not covid vaccinated.  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  Has seen RN 05/23/2020 for BP checks due to headaches in the past week  10/15 150/96 and noted palpitations mild compared to last week per patient; 10/12 141/106 HR 74 (at this visit patient reported she felt like her heart was racing but RN 12/12 noted WNL)  BP 02/20/2020 118/90 HR 66 historically this year prior to illness/stressors.  Up to date beyond the basics hyperthyroidism patient handout sent to patient email.  Patient to alert me if she is having any other symptoms noted in handout.  Will follow up with patient again tomorrow 06/26/2020 telephonically.

## 2020-06-26 NOTE — Telephone Encounter (Signed)
Patient contacted via telephone.  She stated when her thyroid antibodies in 500s she feels really bad last blood level 300 per patient.  Patient reported started on new bio-identical medicine/manufacturer and she thinks her body not responding to it like other manufacturer pills and asked her PCM to send in new Rx after reading up to date handout to restart her old medication manufacturer not one compounded in [place] mail order pharmacy.    August brain fog, exasperated  Patient had gained 20 lbs during covid and lost 20lbs again hoping to maintain weight loss over holidays and goal weight  185 lbs.  Discussed to have coworker take her BP today if greater than 140s/90s I recommend she pick up spironolactone Rx today and start it. Discussed with patient if continued weight loss and thyroid well controlled may not need BP med long term.  Will continue to monitor BP in clinic and symptoms and follow up already scheduled with PCM.   Patient reports vision is blurry when waking up in the morning unable to read email on phone until later in the day.  Last optometry appt 2020 at [facility] and new glasses Rx filled at that visit.    Patient notes water eyes when seasonal allergies flaring.  Denied water eyes or grittiness at this time.  Patient denied all other up to date hyperthyroidism symptoms but is still taking her claritin daily.  Patient going  thyroglobulin antibodies 10/2016 0-0.9 (1.0), thyroid peroxidase (TPO)  range 0-34 450  Patient reported if she takes gluten out of diet thyroid better and allergic to dairy also.  Patient reported she is going to talk with new PCM at [facility] regarding endocrinology in same office then esblish care if new [facility] recommends endocrinology also.  Patient will email me her blood pressure reading this afternoon.  Patient reported she has been feeling better/less anxious/stressed since reading my email and noting that yes these symptoms do follow along with her thyroid/hormone changes  and possibly not a new health problem.  Will ADD full thyroid panel plus TPO and thyroglobulin antibodies to nonfasting blood draw this week.  Even if spironolactone not initiated will check BMET ensure electrolytes WNL since having palpitations this week.  Patient verbalized understanding information/instructions agreed with plan of care and had no further questions at this time.

## 2020-07-05 ENCOUNTER — Other Ambulatory Visit: Payer: Self-pay

## 2020-07-05 ENCOUNTER — Ambulatory Visit: Payer: Self-pay | Admitting: *Deleted

## 2020-07-05 DIAGNOSIS — R030 Elevated blood-pressure reading, without diagnosis of hypertension: Secondary | ICD-10-CM

## 2020-07-05 DIAGNOSIS — E038 Other specified hypothyroidism: Secondary | ICD-10-CM

## 2020-07-05 NOTE — Progress Notes (Signed)
Pt sts that she has taken one dose of Spironolactone so far. Felt it was too strong for her. Wanted to talk to NP to see if she could try half a dose. RN advised pt that was fine and to check BP at home before dose and 2-3 hours after if able to evaluate effectiveness. Plans to speak with Olivia Hamilton again next week after starting half dose tomorrow. Will plan for BP check in clinic then.    TSH and BMP drawn today.

## 2020-07-06 LAB — SPECIMEN STATUS

## 2020-07-07 ENCOUNTER — Encounter: Payer: Self-pay | Admitting: *Deleted

## 2020-07-07 NOTE — Telephone Encounter (Signed)
Telephone message left for patient at home number.  Thomas, CBC results still pending. TSH below readable level suspect overcorrected levothyroxine and dose adjustment required based on lab and your symptoms. Normal glucose, electrolytes and kidney function. is the next lower dose for levothyroxine since taking . I recommend skipping dose this weekend and starting by mouth daily and retest TSH in 6-8 weeks and still keep follow up scheduled with PCM and establish with endocrinology provider. Please let me know your pharmacy preference and I will send in your new Rx.  We have [facility] as your [facility].  Levothyroxine is not on formulary [facility].  You can email me at [email or web address] or use mychart.  Due to Halloween today will be out of the house and unable to see email [phone] tonight. Sincerely, Albina Billet NP-C

## 2020-07-09 LAB — BASIC METABOLIC PANEL
BUN/Creatinine Ratio: 10 (ref 9–23)
BUN: 9 mg/dL (ref 6–24)
CO2: 26 mmol/L (ref 20–29)
Calcium: 9.4 mg/dL (ref 8.7–10.2)
Chloride: 104 mmol/L (ref 96–106)
Creatinine, Ser: 0.86 mg/dL (ref 0.57–1.00)
GFR calc Af Amer: 88 mL/min/{1.73_m2} (ref >59)
GFR calc non Af Amer: 76 mL/min/{1.73_m2} (ref >59)
Glucose: 68 mg/dL (ref 65–99)
Potassium: 4.2 mmol/L (ref 3.5–5.2)
Sodium: 140 mmol/L (ref 134–144)

## 2020-07-09 LAB — TSH: TSH: <0.005 u[IU]/mL — ABNORMAL LOW (ref 0.450–4.500)

## 2020-07-11 NOTE — Telephone Encounter (Signed)
Late entry spoke with patient via telephone.  She stated she had not listened to my voicemail or read her mychart message.  Discussed lab results and recommendations with patient.  [facility]  She stated she restarted her bioidentical hormones from original manufacturer and symptoms (anxiety, headache) resolved and she has been feeling well.  Doesn't want to alter her levothyroxine dosing as it has worked well for her for a long time.  She hasn't seen RN Rolly Salter or Tammy for repeat BP.  Tried one dose spironolactone 25mg  and didn't feel well so hasn't taken any more.  Patient has appt with new PCM 29 Nov Regina Baity at [place] scheduled.  She wants to talk with her new provider about endocrinologist recommendation.  Discussed with patient I recommend repeating TFTs with antibodies in 6-8 weeks since changing her hormones could affect TFTs.  I recommended she continue taking her blood pressure on different times/days so a log can be reviewed by her PCM.  Discussed if BP higher than 130s/70s I recommend starting BP meds may need to start at 12.5mg  spironolactone or a different antihypertensive. Discussed some patients are more sensitive to BP drops when starting medications and may need to go slower with lowering her blood pressure also.  Patient still trying to lose weight.  Noted that her parents required blood pressure medications.  Discussed with patient as we get older we typically gain weight become more sedentary and that contributes to hypertension.   Patient previously tolerated spironolactone well.  Patient reported she will be out of the office but will have Tammy check BP today  Patient to follow up sooner with clinic staff if new or worsening symptoms for re-evaluation prior to [facility] appt.  Patient verbalized understanding information/instructions, agreed with plan of care and had no further questions at this time.

## 2020-07-12 ENCOUNTER — Telehealth: Payer: Self-pay | Admitting: Registered Nurse

## 2020-07-12 NOTE — Telephone Encounter (Signed)
BP with automated machine 130/85 seated on 07/10/2020.  Patient to follow up with RN Rolly Salter next week for repeat BP check.

## 2020-07-20 NOTE — Telephone Encounter (Signed)
Patient on vacation this week out of town.  Will follow up next week to check BP in clinic.

## 2020-07-21 ENCOUNTER — Telehealth: Payer: Self-pay | Admitting: Registered Nurse

## 2020-07-21 NOTE — Telephone Encounter (Signed)
Patient on vacation with sister in [place].  Sister with fever/rhinitis/cough/vomiting this week.  Sister getting covid tested.  Discussed with patient if sister tests positive for covid she will need to quarantine for 14 days or 10 days if symptoms develop and have covid testing performed immediately if sister test positive and again in 7 days if her initial test negative.  Patient not covid vaccinated.

## 2020-07-22 ENCOUNTER — Encounter: Payer: Self-pay | Admitting: Registered Nurse

## 2020-07-22 NOTE — Telephone Encounter (Signed)
Patient contacted clinic staff today left message stating she started not feeling well this morning.  Sinus congestion/pressure, sore throat, fatigue, cough and ears popping.  Using OTC medication at this time and helping but not feeling well enough to drive home today so stayed in [place].  Taking tylenol, nasal saline, flonase and mucinex and it is helping.  Denied fever/chills/nausea/vomiting/diarrhea/dyspnea/shortness of breath.  Sister had covid test today and was told 48 hours before results available.  Quarantine initiated today until test results known.  If patient symptoms worsening develops loss of taste/smell, n/v/d, fever/chills, dyspnea, shortness of breath to notify clinic staff/get covid testing.  If sister tests positive for covid she should have covid testing ASAP.  Patient instructed to contact [facility] if staying in [place] and feeling like full blown sinusitis developing.  Patient would like to avoid antibiotics if possible. Discussed increasing nasal saline use 2 sprays each nostril q2h wa, continuing flonase 1 spray each nostril twice a day and showering at least daily before bed.  Phenylephrine 5-10mg  by mouth every 6 hours for rhinitis.  Continue claritin 10mg  po daily.  Mucinex if thick mucous/tenacious can't expel per manufacturer's instructions.  Discussed flu typically fever/body aches/URI symptoms.  Cold virus currently circulating locally causing body aches, rhinitis, cough, fatigue but not high fever typically.  Covid for vaccinated individuals can be similar to allergy/sinus symptoms but typically unvaccinated have loss/taste smell, fever, rhinitis, cough, dyspnea/shortness of breath.  If blue lips/face, chest pain that is worsening, syncope/LOC/confusion, trouble breathing and worsening it is recommended she go to ER.  Patient asked when sister is cleared to RTW discussed 18 Nov if improving and no fever/chills/n/v/d within 24 hours of RTW and covid test negative.    Patient verbalized  understanding information/instructions, agreed with plan of care and had no further questions at this time.

## 2020-07-22 NOTE — Telephone Encounter (Signed)
Will do. Pt out today.

## 2020-07-23 NOTE — Telephone Encounter (Signed)
Patient contacted via telephone/audio only 15 minutes duration.  Patient reported still feeling hot during the day today and threw up one time.  Had 1/4 piece of gluten free bread and water today.  "I have drank a lot of water today"   I have recommended clear fluids and advance diet as tolerated recommended bland diet.  Avoid dairy/spicy, fried and large portions of meat while having nausea.  If vomiting hold po intake x 1 hour.  Then sips clear fluids like broths, ginger ale, power ade, gatorade, pedialyte may advance to soft/bland if no vomiting x 24 hours and appetite returned otherwise hydration main focus. Try to sip 1 cup 8 oz per hour.  Consider calling [facility] for ondansetron/ zofran Rx if OTC remedies such as ginger root, ginger tea, ginger ale or sniffing alcohol wipe not helping.  Do not drink too large amounts of fluid as too much can worsen stomach queasiness too.  Ensure you tell [facility] away on vacation out of state, sister was sick first similar symptoms covid test pending her upset stomach only lasted a couple days ... I have alerted the patient to call if high fever, dehydration, marked weakness, fainting, increased abdominal pain, blood in stool or vomit (red or black).   ER if vomiting blood/unable to tolerate any oral intake/unable to urinate every 8 hours/trouble breathing.     Coffee/Tea are diuretic.  I recommend gatorade/nondairy popsicles/ginger ale/clear soup broths first line if possible.      Nasal saline did help to decrease nasal congestion.  She is going to start using it on regular basis not just with this illness but for her allergies   Afraid to take any phenylephrine/tylenol pills as she thinks will just throw them up.  Motrin works well for me in past and today.  Didn't take motrin during the day today due to nausea just took at dinner.  Nasal Discharge clear.  Body aches.  Just took 2-200mg  motrin about an hour ago and it helped her to feel better/less achyness/headache.     Pharmacy covid test in [place] requires saliva collection 1/2 test tube and patient doesn't feel she will be able to generate enough spit to do test.   Discussed saliva covid test in studies has been more accurate than rapid nasal swab tests and very sensitive (missed 2/37 positives in one [facility] study and 98% accurate negative tests) Trying to drive home to [facility] Thursday 07/25/2020 and do PCR nasal swab test on arrival in [place].  Discussed I will find out drive up lab times tomorrow for [facility] location and send to her along with address.  Patient to call me tomorrow with status update e.g. symptoms same, improved or worsened and if feeling well enough to travel or did she have to call [facility]/go to ER.  Patient verbalized agreement and understanding of treatment plan and had no further questions at this time.

## 2020-07-23 NOTE — Telephone Encounter (Signed)
Telephone message left for patient checking in to see how medicine helped or didn't help that was discussed earlier this am.  Will be unavailable for 1 hour but then can return her call to discuss further.

## 2020-07-23 NOTE — Telephone Encounter (Addendum)
[employer] Jasmine December notified NP that pt reported feeling worse today and unable to travel home.  Called teledoc and no Rx received.  Patient believes she needs antibiotics for sinusitis and wondering if she should take a couple doses of her sister's medication.  Notified Jasmine December I would contact patient via telephone but I could not prescribe any medications across state lines.  Patient reached via telephone call duration 15 minutes.  Patient developed fever last night doesn't have thermometer hot and sweaty, post nasal drip, sweats/chills, body aches, sore throat, dizzy, can't smell anything this morning.  Sinus pressure, congestion, headache  Denied n/v/d   Discussed with patient I recommended covid testing tomorrow PCR as day 2 symptoms today.  Drive up testing available in [place], [place] but if in [place] she should probably call to schedule appt.  Sister covid test results still pending (they are on vacation together visiting family in [place])  Patient to start 10 day quarantine. Today is day 1 through 25 Nov.  RTW 26 Nov.  May shorten to 7 day if covid test negative and symptoms resolved.    Sister must remain on quarantine until Kaleena's/pt test results known.  Discussed phenylephrine 5-10mg  po q6h prn rhinitis/congestion, afrin 1 spray each nostril twice a day for 3 days only, tylenol max 1000mg  po q6h , saline nasal 2 sprays each nostril qh (may use as frequent as she needs until able to expel mucous/decrease congestion then start on regular basis q2h or QID while awake.  Discussed saline washes out dust/virus/pollens/allergens and nasal mucosa becomes less swollen/irritable/produces less mucous if virus/allergens removed, motrin max dosing 800mg  by mouth every 8 hours.  Best if she take motrin with food.  Discussed each medication purpose and possible side effects.  Antibiotics are not helpful with viral/cold infections causing inflammation of sinuses/nasal passages.  Oral steroids, nasal saline, afrin,  decongestants (sudafed or cousin phenylephrine/dayquil/nyquil), mucinex (if thick phlegm/mucous)  Discussed symptoms could be influenza or covid or another virus.  Community spread is occurring with flu/covid/other viruses and typically lasting 7-14 days.  Influenza can be fever, rhinitis/cough/dyspnea/body aches/nausea/vomiting/diarrhea also.  Tamiflu may be helpful if started early in course of influenza.   Reviewed possible Covid sx including cough, shortness of breath with exertion or at rest, runny nose, congestion, sinus pain/pressure, sore throat, fever/chills, body aches, fatigue, loss of taste/smell, GI symptoms of nausea/vomiting/diarrhea. Also reviewed same day/emergent eval/ER precautions of dizziness/syncope, confusion, blue tint to lips/face, severe shortness of breath/difficulty breathing/wheezing.   Patient to isolate in own room and if possible use only one bathroom if living with others in home.  Wear mask when out of room to help prevent spread to others in household.  Discussed I will call this afternoon to see if nasal saline/phenylephrine/tylenol along with her flonase and OTC antihistamine helping to decrease her rhinitis/fever/congestion/sinus pressure.  Encouraged hydration/rest.   Pt verbalized understanding and agreement with plan of care. No further questions/concerns at this time. Pt reminded to contact clinic with any changes in sx or questions/concerns.

## 2020-07-25 NOTE — Telephone Encounter (Signed)
Late entry patient called me 07/24/2020 at 1626 duration of call 7 minutes.  Patient reported feeling better trying to get Rx for her prescription medications from [facility] office sent to [place] pharmacy as running out of her chronic medications.  Had scheduled covid test for 07/25/2020 in [place], [place] which is 1  hour drive from parent's house but will have to drive to [place] instead.  Has covid test scheduled [facility] 07/26/2020.  Chills and fever earlier today.  She is going to cancel [place] covid test appt if driving to [facility] 14/38.  Continuing quarantine no vomiting toy and ate gluten free toast and peeing okay tolerating oral fluids without difficulty, congestion and sinus pressure decreased.  Sleeping so so.  Memory not as good as usual.  Sister's covid test results negative.  Hoping to return to work onsite Monday 22 Nov and discussed if test negative, symptoms improved and no fever/chills/vomiting/diarrhea in previous 24 hours she would be cleared to return to work.  Continue nasal saline increased frequency along with flonase nasal 1 spray each nostril BID, shower at least daily.  May notice increased pressure this weekend as cold front/barometric pressure change scheduled to pass through area between Friday and Saturday.  Patient verbalized understanding information/instructions, agreed with plan of care and had no further questions at this time.

## 2020-07-28 MED ORDER — AZITHROMYCIN 250 MG PO TABS
ORAL_TABLET | ORAL | 0 refills | Status: DC
Start: 2020-07-28 — End: 2020-08-05

## 2020-07-28 MED ORDER — DM-GUAIFENESIN ER 30-600 MG PO TB12: 1.0000 | ORAL_TABLET | Freq: Two times a day (BID) | ORAL | 0 refills | Status: AC

## 2020-07-28 MED ORDER — ALBUTEROL SULFATE HFA 108 (90 BASE) MCG/ACT IN AERS
1.0000 | INHALATION_SPRAY | Freq: Four times a day (QID) | RESPIRATORY_TRACT | 0 refills | Status: DC | PRN
Start: 2020-07-28 — End: 2021-02-18

## 2020-07-28 NOTE — Telephone Encounter (Signed)
Patient reported she picked up  Her Rx azithromycin and albuterol inhaler and took first doses.  Patient restarted her Rx for  Bromphed DM today also and it is helping but giving her a dry mouth  Hold robitussin DM if taking bromphed DM.  Doesn't feel like she can work onsite tomorrow due to cough/fatigue.  Felt hot this morning but did not check temperature.  Patient notified her supervisor planning to either work from home or not work at all depending on how she feels in the morning.  Patient with congested/froggy voice.  No cough audible during 6 minute telephone conversation Respirations even and unlabored A&Ox3  Spoke full sentences without difficulty.  She does not have sp02 monitor at home any longer.  Discussed with patient will have RN Rolly Salter check in clinic tomorrow if any loaners available and will send to her to use during this illness. She just drove home from [place] yesterday and 7-8 hours in car she thinks wore her down.  Continue flonase/nasal saline, rest, hydrate and regular meals to avoid large dairy portions as can worsen mucous production.  If thick tenacious sputum/mucous start mucinex 600mg  by mouth every 12 hours to thin out mucous.  Patient to contact me if new or worsening symptoms via email [email or web address] or phone.  Patient verbalized understanding information/instructions, agreed with plan of care and had no further questions at this time.

## 2020-07-28 NOTE — Telephone Encounter (Addendum)
Patient left message covid test results negative but having thick yellow mucous from chest and protracted coughing.  Patient to start mucinex (guaifenesin) 600mg  by mouth twice a day and will start azithromycin 500mg  today then 250mg  by mouth daily days 2-5 #6 RF0 electronic Rx to [facility] of choice [facility] today.  Hydate.  Albuterol inhaler 1-2 puffs po q4-6 hours prn protracted cough/wheezing/chest tightness. OTC robitussin dm po per manufacturer instructions prn cough or cough lozenges prn per manufacturer instructions or honey with lemon.  Message left for patient I will call her again later this afternoon also.  Cleared to return to work onsite as long as no fever/vomiting/diarrhea today and symptoms improving.  HR notified.

## 2020-07-30 NOTE — Telephone Encounter (Signed)
Notified by HR patient feeling worse and to please call patient.  Patient contacted via telephone at home working remote.  Worked late until 1900 last night.  Working remote today.  Voice better 2 coughing fits so far today.  Albuterol MDI use once today.  Taking her bromphed and antibiotic.  Got wore out yesterday and didn't sleep well "I never hit my deep sleep cycle"  No cough during telephone conversation today, voice stronger, felt crummy this am but feeling better now after taking her morning antibiotic dose.  Has appt with PCM scheduled Monday afternoon 08/05/2020 for follow up.  Denied fever/chills/n/v/d/wheezing/chest tightness/shortness of breath or dyspnea.  Just really tired today/fatigued.  Patient plans to go into work tomorrow.  Patient to contact me if new or worsening symptoms.  Discussed albuterol and bromphed DM can sometimes cause insomnia/jitteryness but typically causes drowsiness in most patients for bromphed.  Patient verbalized understanding information/instructions, agreed with plan of care and had no further questions at this time.

## 2020-08-04 NOTE — Telephone Encounter (Signed)
Patient reported feeling better cough resolved no concerns or questions at this time.  Routine scheduled PCM appt this week.  Reminded patient to discuss endocrinology referral/recent labs at [facility].

## 2020-08-05 ENCOUNTER — Encounter: Payer: Self-pay | Admitting: Internal Medicine

## 2020-08-05 ENCOUNTER — Other Ambulatory Visit: Payer: Self-pay

## 2020-08-05 ENCOUNTER — Ambulatory Visit: Payer: PRIVATE HEALTH INSURANCE | Admitting: Internal Medicine

## 2020-08-05 DIAGNOSIS — E039 Hypothyroidism, unspecified: Secondary | ICD-10-CM | POA: Diagnosis not present

## 2020-08-05 NOTE — Progress Notes (Signed)
HPI  Pt presents to the clinic today to establish care. She is transferring care from Dr. Nicholos Johns.  Hypothyroidism: She denies any issues on her current dose of Levothyroxine. She follows with [facility].  Flu: never Tetanus: > 10 years ago Covid: never Pap Smear: 09/2019 Mammogram: 06/2019 Bone Density: 08/2019 Colon Screening: never Vision Screening: as needed Dentist: biannually  Past Medical History:  Diagnosis Date  . Thyroid disease   . Varicose veins     Current Outpatient Medications  Medication Sig Dispense Refill  . albuterol (VENTOLIN HFA) 108 (90 Base) MCG/ACT inhaler Inhale 1-2 puffs into the lungs every 6 (six) hours as needed for wheezing or shortness of breath. 8 g 0  . aspirin 81 MG tablet Take 81 mg by mouth daily.     . fluticasone (FLONASE) 50 MCG/ACT nasal spray Place 1 spray into both nostrils 2 (two) times daily as needed for allergies or rhinitis. 48 mL 3  . ibuprofen (ADVIL) 800 MG tablet Take 1 tablet (800 mg total) by mouth 3 (three) times daily. 21 tablet 0  . levothyroxine (SYNTHROID, LEVOTHROID) 88 MCG tablet Take 88 mcg by mouth daily before breakfast.    . loratadine (CLARITIN) 10 MG tablet Take 10 mg by mouth daily.    . sodium chloride (OCEAN) 0.65 % SOLN nasal spray Place 2 sprays into both nostrils every 2 (two) hours while awake.  0   No current facility-administered medications for this visit.    Allergies  Allergen Reactions  . Sulfonamide Derivatives     REACTION: eye swelling    Family History  Problem Relation Age of Onset  . Other Mother        VARICOSE VEINS  . Arthritis Mother   . Hypertension Mother   . Hypertension Father     Social History   Socioeconomic History  . Marital status: Married    Spouse name: Not on file  . Number of children: Not on file  . Years of education: Not on file  . Highest education level: Not on file  Occupational History  . Not on file  Tobacco Use  . Smoking status: Never  Smoker  . Smokeless tobacco: Never Used  Substance and Sexual Activity  . Alcohol use: Yes    Alcohol/week: 2.0 standard drinks    Types: 1 Glasses of wine, 1 Cans of beer per week  . Drug use: No  . Sexual activity: Not on file  Other Topics Concern  . Not on file  Social History Narrative  . Not on file   Social Determinants of Health   Financial Resource Strain:   . Difficulty of Paying Living Expenses: Not on file  Food Insecurity:   . Worried About Programme researcher, broadcasting/film/video in the Last Year: Not on file  . Ran Out of Food in the Last Year: Not on file  Transportation Needs:   . Lack of Transportation (Medical): Not on file  . Lack of Transportation (Non-Medical): Not on file  Physical Activity:   . Days of Exercise per Week: Not on file  . Minutes of Exercise per Session: Not on file  Stress:   . Feeling of Stress : Not on file  Social Connections:   . Frequency of Communication with Friends and Family: Not on file  . Frequency of Social Gatherings with Friends and Family: Not on file  . Attends Religious Services: Not on file  . Active Member of Clubs or Organizations: Not on file  .  Attends Banker Meetings: Not on file  . Marital Status: Not on file  Intimate Partner Violence:   . Fear of Current or Ex-Partner: Not on file  . Emotionally Abused: Not on file  . Physically Abused: Not on file  . Sexually Abused: Not on file    ROS:  Constitutional: Denies fever, malaise, fatigue, headache or abrupt weight changes.  HEENT: Denies eye pain, eye redness, ear pain, ringing in the ears, wax buildup, runny nose, nasal congestion, bloody nose, or sore throat. Respiratory: Denies difficulty breathing, shortness of breath, cough or sputum production.   Cardiovascular: Pt reports varicose veins. Denies chest pain, chest tightness, palpitations or swelling in the hands or feet.  Gastrointestinal: Denies abdominal pain, bloating, constipation, diarrhea or blood in the  stool.  GU: Denies frequency, urgency, pain with urination, blood in urine, odor or discharge. Musculoskeletal: Denies decrease in range of motion, difficulty with gait, muscle pain or joint pain and swelling.  Skin: Denies redness, rashes, lesions or ulcercations.  Neurological: Denies dizziness, difficulty with memory, difficulty with speech or problems with balance and coordination.  Psych: Denies anxiety, depression, SI/HI.  No other specific complaints in a complete review of systems (except as listed in HPI above).  PE:  BP 132/84   Pulse 76   Temp (!) 97.2 F (36.2 C) (Temporal)   Wt 216 lb (98 kg)   SpO2 98%   BMI 34.86 kg/m   Wt Readings from Last 3 Encounters:  01/28/17 219 lb (99.3 kg)  11/01/12 190 lb (86.2 kg)  10/17/12 190 lb (86.2 kg)    General: Appears her stated age, obese, in NAD. HEENT: Head: normal shape and size; Eyes: sclera white and EOMs intact;  Cardiovascular: Normal rate. Pulmonary/Chest: Normal effort. Musculoskeletal: No difficulty with gait.  Neurological: Alert and oriented.  Psychiatric: Mood and affect normal. Behavior is normal. Judgment and thought content normal.     BMET    Component Value Date/Time   NA 140 07/05/2020 1005   K 4.2 07/05/2020 1005   CL 104 07/05/2020 1005   CO2 26 07/05/2020 1005   GLUCOSE 68 07/05/2020 1005   BUN 9 07/05/2020 1005   CREATININE 0.86 07/05/2020 1005   CALCIUM 9.4 07/05/2020 1005   GFRNONAA 76 07/05/2020 1005   GFRAA 88 07/05/2020 1005    Lipid Panel     Component Value Date/Time   CHOL 187 01/28/2017 0917   TRIG 73 01/28/2017 0917   HDL 63 01/28/2017 0917   CHOLHDL 3.0 01/28/2017 0917   LDLCALC 109 (H) 01/28/2017 0917    CBC    Component Value Date/Time   WBC WILL FOLLOW 07/05/2020 1005   RBC WILL FOLLOW 07/05/2020 1005   HGB WILL FOLLOW 07/05/2020 1005   HCT WILL FOLLOW 07/05/2020 1005   PLT WILL FOLLOW 07/05/2020 1005   MCV WILL FOLLOW 07/05/2020 1005   MCH WILL FOLLOW  07/05/2020 1005   MCHC WILL FOLLOW 07/05/2020 1005   RDW WILL FOLLOW 07/05/2020 1005   LYMPHSABS WILL FOLLOW 07/05/2020 1005   EOSABS WILL FOLLOW 07/05/2020 1005   BASOSABS WILL FOLLOW 07/05/2020 1005    Hgb A1C Lab Results  Component Value Date   HGBA1C 5.1 01/28/2017     Assessment and Plan:  Olivia Reaper, NP This visit occurred during the SARS-CoV-2 public health emergency.  Safety protocols were in place, including screening questions prior to the visit, additional usage of staff PPE, and extensive cleaning of exam room while observing appropriate contact  time as indicated for disinfecting solutions.

## 2020-08-05 NOTE — Telephone Encounter (Signed)
Patient had initial visit with [facility] and is going to continue with [facility] for management of thyroid.  BP slightly elevated but improved compared to clinic visit.

## 2020-08-05 NOTE — Assessment & Plan Note (Signed)
Managed by [facility] Continue Levothyroxine

## 2020-08-05 NOTE — Telephone Encounter (Signed)
Noted. Pt RTW and feeling better.

## 2020-08-05 NOTE — Patient Instructions (Signed)
Hypothyroidism  Hypothyroidism is when the thyroid gland does not make enough of certain hormones (it is underactive). The thyroid gland is a small gland located in the lower front part of the neck, just in front of the windpipe (trachea). This gland makes hormones that help control how the body uses food for energy (metabolism) as well as how the heart and brain function. These hormones also play a role in keeping your bones strong. When the thyroid is underactive, it produces too little of the hormones thyroxine (T4) and triiodothyronine (T3). What are the causes? This condition may be caused by:  Hashimoto's disease. This is a disease in which the body's disease-fighting system (immune system) attacks the thyroid gland. This is the most common cause.  Viral infections.  Pregnancy.  Certain medicines.  Birth defects.  Past radiation treatments to the head or neck for cancer.  Past treatment with radioactive iodine.  Past exposure to radiation in the environment.  Past surgical removal of part or all of the thyroid.  Problems with a gland in the center of the brain (pituitary gland).  Lack of enough iodine in the diet. What increases the risk? You are more likely to develop this condition if:  You are female.  You have a family history of thyroid conditions.  You use a medicine called lithium.  You take medicines that affect the immune system (immunosuppressants). What are the signs or symptoms? Symptoms of this condition include:  Feeling as though you have no energy (lethargy).  Not being able to tolerate cold.  Weight gain that is not explained by a change in diet or exercise habits.  Lack of appetite.  Dry skin.  Coarse hair.  Menstrual irregularity.  Slowing of thought processes.  Constipation.  Sadness or depression. How is this diagnosed? This condition may be diagnosed based on:  Your symptoms, your medical history, and a physical exam.  Blood  tests. You may also have imaging tests, such as an ultrasound or MRI. How is this treated? This condition is treated with medicine that replaces the thyroid hormones that your body does not make. After you begin treatment, it may take several weeks for symptoms to go away. Follow these instructions at home:  Take over-the-counter and prescription medicines only as told by your health care provider.  If you start taking any new medicines, tell your health care provider.  Keep all follow-up visits as told by your health care provider. This is important. ? As your condition improves, your dosage of thyroid hormone medicine may change. ? You will need to have blood tests regularly so that your health care provider can monitor your condition. Contact a health care provider if:  Your symptoms do not get better with treatment.  You are taking thyroid replacement medicine and you: ? Sweat a lot. ? Have tremors. ? Feel anxious. ? Lose weight rapidly. ? Cannot tolerate heat. ? Have emotional swings. ? Have diarrhea. ? Feel weak. Get help right away if you have:  Chest pain.  An irregular heartbeat.  A rapid heartbeat.  Difficulty breathing. Summary  Hypothyroidism is when the thyroid gland does not make enough of certain hormones (it is underactive).  When the thyroid is underactive, it produces too little of the hormones thyroxine (T4) and triiodothyronine (T3).  The most common cause is Hashimoto's disease, a disease in which the body's disease-fighting system (immune system) attacks the thyroid gland. The condition can also be caused by viral infections, medicine, pregnancy, or past   radiation treatment to the head or neck.  Symptoms may include weight gain, dry skin, constipation, feeling as though you do not have energy, and not being able to tolerate cold.  This condition is treated with medicine to replace the thyroid hormones that your body does not make. This information  is not intended to replace advice given to you by your health care provider. Make sure you discuss any questions you have with your health care provider. Document Revised: 08/06/2017 Document Reviewed: 08/04/2017 Elsevier Patient Education  2020 Elsevier Inc.  

## 2020-08-06 ENCOUNTER — Encounter: Payer: Self-pay | Admitting: Internal Medicine

## 2020-08-09 NOTE — Telephone Encounter (Signed)
Patient seen at [facility] office BP slightly improved with change in her compounded medications and acute illness mostly resolved.

## 2020-08-15 NOTE — Telephone Encounter (Signed)
Please schedule patient for BP recheck

## 2020-08-22 ENCOUNTER — Other Ambulatory Visit: Payer: Self-pay | Admitting: Registered Nurse

## 2020-08-24 ENCOUNTER — Telehealth: Payer: Self-pay | Admitting: Registered Nurse

## 2020-08-24 DIAGNOSIS — R1111 Vomiting without nausea: Secondary | ICD-10-CM

## 2020-08-24 DIAGNOSIS — R519 Headache, unspecified: Secondary | ICD-10-CM

## 2020-08-24 MED ORDER — ONDANSETRON 4 MG PO TBDP: 4.0000 mg | ORAL_TABLET | Freq: Three times a day (TID) | ORAL | 0 refills | Status: DC | PRN

## 2020-08-24 MED ORDER — SALINE SPRAY 0.65 % NA SOLN: 2.0000 | NASAL | 0 refills | Status: DC

## 2020-08-24 NOTE — Telephone Encounter (Signed)
Patient left message had migraine yesterday due to stress at work and weather.  Sensitivity to smells and light, threw up and bad migraine denied fever/chills/rhinitis/sore throat/cough/body aches/diarrhea.  Her younger sister has migraines and uses zofran ODT patient wondering if she could get Rx.  Also reporting to clinic staff headache/vomiting due to covid pandemic and requiring clearance to return to work.  Patient contacted via telephone at home.  Reported feeling a little better today.  Discussed cold front pushing into area and accompanying barometric changes could trigger another headache today.  Hydrate, rest, eat regular meals, avoid known triggers.  Electronic Rx zofran ODT generic 4mg  po TID prn n/v #20 RF0 sent to her pharmacy of choice.  Discussed with patient if 4mg  not strong enough may take another 4mg  tab but if using 8mg  dosing may repeat in 12 hours not 8 hours (twice a day max use).  Reviewed epocrates medication interaction checker and no known interactions with her usual medications.  Patient has noted less frequency of migraines since she has been using flonase and claritin every night before bedtime.  Patient to follow up with clinic staff if new or worsening symptoms.   I have recommended clear fluids and bland diet.  Avoid dairy/spicy, fried and large portions of meat while having nausea.  If vomiting hold po intake x 1 hour.  Then sips clear fluids like broths, ginger ale, power ade, gatorade, pedialyte may advance to soft/bland if no vomiting x 24 hours and appetite returned otherwise hydration main focus. Return to the clinic if symptoms persist or worsen; especially high fever, dehydration, marked weakness, fainting, increased abdominal pain, blood in stool or vomit (red or black).  Patient verbalized agreement and understanding of treatment plan and had no further questions at this time.  HR Tim notified personal medical no quarantine or covid testing recommended at this time may  return to work as long as no new or worsening symptoms.

## 2020-08-25 NOTE — Telephone Encounter (Signed)
Telephone message left for patient follow up to ensure no worsening or new symptoms and if symptoms improving and if she had to take zofran ODT.

## 2020-09-16 NOTE — Telephone Encounter (Signed)
Patient out of state for undetermined amount of time working remote due to severely ill family members.  Will follow up upon her return.

## 2020-09-16 NOTE — Telephone Encounter (Signed)
Patient offsite out of state indeterminate amount of time due to severely ill family members.  Will follow up upon her return

## 2020-10-21 ENCOUNTER — Telehealth: Payer: Self-pay | Admitting: *Deleted

## 2020-10-21 NOTE — Telephone Encounter (Signed)
Exposure through Covid floor at hospital where mother was being treated. Was in large group on Saturday 2/12 for mother's funeral. Drove back home yesterday. Testing set up for tomorrow 2/15 at noon [facility] prior to returning onsite. Working remote until results back. Denies any sx.

## 2020-10-24 NOTE — Telephone Encounter (Signed)
Spoke with patient by phone. She confirmed negative Covid test result. Asymptomatic. Cleared to RTW onsite tomorrow 2/18.

## 2020-10-25 ENCOUNTER — Encounter: Payer: Self-pay | Admitting: *Deleted

## 2020-10-25 NOTE — Telephone Encounter (Signed)
Pt did RTW onsite today 2/18. Closing encounter.

## 2020-10-25 NOTE — Telephone Encounter (Signed)
RN Rolly Salter and I reviewed case in clinic this week verbally.  Negative test, working remote but planned to return onsite soon.  Noted returned today 10/25/20.  Negative covid test and asymptomatic.

## 2020-10-31 NOTE — Telephone Encounter (Signed)
Patient working onsite today no questions or concerns.

## 2020-11-28 ENCOUNTER — Other Ambulatory Visit: Payer: Self-pay

## 2020-11-28 ENCOUNTER — Encounter: Payer: Self-pay | Admitting: Registered Nurse

## 2020-11-28 ENCOUNTER — Ambulatory Visit: Payer: Self-pay | Admitting: Registered Nurse

## 2020-11-28 DIAGNOSIS — B079 Viral wart, unspecified: Secondary | ICD-10-CM

## 2020-11-28 NOTE — Patient Instructions (Signed)
Warts  Warts are small growths on the skin. They are common and can occur on many areas of the body. A person may have one wart or several warts. In many cases, warts do not require treatment. They usually go away on their own over a period of many months to a few years. If needed, warts that cause problems or do not go away on their own can be treated. What are the causes? Warts are caused by a type of virus that is called human papillomavirus (HPV).  This virus can spread from person to person through direct contact.  Warts can also spread to other areas of the body when a person scratches a wart and then scratches another area of his or her body. What increases the risk? You are more likely to develop this condition if:  You are 10-20 years old.  You have a weakened body defense system (immune system).  You are Caucasian. What are the signs or symptoms? The main symptom of this condition is small growths on the skin. Warts may:  Be round or oval or have an irregular shape.  Have a rough surface.  Range in color from skin color to light yellow, brown, or gray.  Generally be less than  inch (1.3 cm) in size.  Go away and then come back again. Most warts are painless, but some can be painful if they are large or occur in an area of the body where pressure will be applied to them, such as the bottom of the foot. How is this diagnosed? A wart can usually be diagnosed based on its appearance. In some cases, a tissue sample may be removed (biopsy) to be looked at under a microscope. How is this treated? In many cases, warts do not need treatment. Sometimes treatment is desired. If treatment is needed or desired, options may include:  Applying medicated solutions, creams, or patches to the wart. These may be over-the-counter or prescription medicines that make the skin soft so that layers will gradually shed away. In many cases, the medicine is applied one or two times per day and  covered with a bandage.  Putting duct tape over the top of the wart (occlusion). You will leave the tape in place for as long as told by your health care provider and then replace it with a new strip of tape. This is done until the wart goes away.  Freezing the wart with liquid nitrogen (cryotherapy).  Burning the wart with: ? Laser treatment. ? An electrified probe (electrocautery).  Injection of a medicine (Candida antigen) into the wart to help the body's immune system fight off the wart.  Surgery to remove the wart. Follow these instructions at home: Medicines  Apply over-the-counter and prescription medicines only as told by your health care provider.  Do not apply over-the-counter wart medicines to your face or genitals unless your health care provider tells you to do that. Lifestyle  Keep your immune system healthy. To do this: ? Eat a healthy, balanced diet. ? Get enough sleep. ? Do not use any products that contain nicotine or tobacco, such as cigarettes and e-cigarettes. If you need help quitting, ask your health care provider. General instructions  Wash your hands after you touch a wart.  Do not scratch or pick at a wart.  Avoid shaving hair that is over a wart.  Keep all follow-up visits as told by your health care provider. This is important.   Contact a health care provider   if:  Your warts do not improve after treatment.  You have redness, swelling, or pain at the site of a wart.  You have bleeding from a wart that does not stop with light pressure.  You have diabetes and you develop a wart. Summary  Warts are small growths on the skin. They are common and can occur on many areas of the body.  In many cases, warts do not need treatment. Sometimes treatment is desired. If treatment is needed or desired, there are several treatment options.  Apply over-the-counter and prescription medicines only as told by your health care provider.  Wash your hands  after you touch a wart.  Keep all follow-up visits as told by your health care provider. This is important. This information is not intended to replace advice given to you by your health care provider. Make sure you discuss any questions you have with your health care provider. Document Revised: 01/11/2018 Document Reviewed: 01/11/2018 Elsevier Patient Education  2021 [employer]  

## 2020-11-28 NOTE — Progress Notes (Signed)
Subjective:    Patient ID: Olivia Hamilton, female    DOB: [birth year], 55 y.o.   MRN: [id number]  54y/o caucasian female established patient here for wart shave down right thumb reoccurrence.  Patient thinks increased stress of sibling and mother sick with covid then mother dying and returning to work depleted her immune system and wart came back.     Review of Systems  Constitutional: Negative for activity change, appetite change, chills, diaphoresis, fatigue and fever.  HENT: Negative for trouble swallowing and voice change.   Eyes: Negative for photophobia and visual disturbance.  Respiratory: Negative for cough.   Cardiovascular: Negative for chest pain.  Gastrointestinal: Negative for diarrhea, nausea and vomiting.  Endocrine: Negative for cold intolerance and heat intolerance.  Genitourinary: Negative for difficulty urinating.  Musculoskeletal: Negative for gait problem, neck pain and neck stiffness.  Skin: Positive for rash. Negative for color change and wound.  Allergic/Immunologic: Positive for environmental allergies. Negative for food allergies.  Neurological: Negative for dizziness, tremors, seizures, syncope, facial asymmetry, speech difficulty, weakness, light-headedness, numbness and headaches.  Psychiatric/Behavioral: Positive for sleep disturbance. Negative for agitation and confusion.       Objective:   Physical Exam Physical Exam Vitals and nursing note reviewed.  Constitutional:      General: She is awake. She is not in acute distress.    Appearance: Normal appearance. She is well-developed and well-groomed. She is obese. She is not ill-appearing, toxic-appearing or diaphoretic.  HENT:     Head: Normocephalic and atraumatic.     Jaw: There is normal jaw occlusion.     Right Ear: Hearing and external ear normal.     Left Ear: Hearing and external ear normal.     Nose: Nose normal.     Mouth/Throat:     Mouth: No angioedema.     Pharynx: Oropharynx is  clear.  Eyes:     General: Lids are normal. Vision grossly intact. Gaze aligned appropriately. Allergic shiner present. No visual field deficit or scleral icterus.       Right eye: No discharge.        Left eye: No discharge.     Extraocular Movements: Extraocular movements intact.     Conjunctiva/sclera: Conjunctivae normal.     Pupils: Pupils are equal, round, and reactive to light.  Neck:     Thyroid: No thyromegaly.     Trachea: Trachea normal.  Cardiovascular:     Rate and Rhythm: Normal rate and regular rhythm.     Pulses: Normal pulses.          Radial pulses are 2+ on the right side and 2+ on the left side.  Pulmonary:     Effort: Pulmonary effort is normal. No respiratory distress.     Breath sounds: Normal breath sounds and air entry. No stridor, decreased air movement or transmitted upper airway sounds. No decreased breath sounds, wheezing, rhonchi or rales.     Comments: spoke full sentences without difficulty; no cough observed in exam room Abdominal:     General: Abdomen is flat.  Musculoskeletal:        General: Tenderness present. No swelling, deformity or signs of injury. Normal range of motion.     Right shoulder: Normal.     Left shoulder: Normal.     Right elbow: Normal.     Left elbow: Normal.     Right hand: Tenderness present. No swelling, deformity, lacerations or bony tenderness. Normal range of motion. Normal strength. Normal  sensation. There is no disruption of two-point discrimination. Normal capillary refill. Normal pulse.     Left hand: Normal.     Cervical back: Normal range of motion and neck supple. No edema, erythema, signs of trauma, rigidity, torticollis or crepitus. No pain with movement. Normal range of motion.  Lymphadenopathy:     Head:     Right side of head: No submental, submandibular or preauricular adenopathy.     Left side of head: No submental, submandibular or preauricular adenopathy.     Cervical: No cervical adenopathy.     Right  cervical: No superficial cervical adenopathy.    Left cervical: No superficial cervical adenopathy.  Skin:    General: Skin is warm and dry.     Capillary Refill: Capillary refill takes less than 2 seconds.     Coloration: Skin is not ashen, cyanotic, jaundiced, mottled, pale or sallow.     Findings: Lesion and rash present. No abrasion, abscess, acne, bruising, burn, ecchymosis, erythema, signs of injury, laceration, petechiae or wound. Rash is papular and scaling. Rash is not crusting, macular, nodular, purpuric, pustular, urticarial or vesicular.     Nails: There is no clubbing.          Comments: DIP/PIP/MCP joints without pain/erythema/swelling bilateral hands; Cleansed affected area with iodine then shaved palmar right thumb lesion until pinpoint bleeding and slight tenderness; dressed with triple antibiotic and fingertip bandaid palmar right thumb distal phalanx after procedure   Neurological:     General: No focal deficit present.     Mental Status: She is alert and oriented to person, place, and time. Mental status is at baseline.     GCS: GCS eye subscore is 4. GCS verbal subscore is 5. GCS motor subscore is 6.     Cranial Nerves: Cranial nerves are intact. No cranial nerve deficit, dysarthria or facial asymmetry.     Sensory: Sensation is intact. No sensory deficit.     Motor: Motor function is intact. No weakness, tremor, atrophy, abnormal muscle tone or seizure activity.     Coordination: Coordination is intact. Coordination normal.     Gait: Gait is intact. Gait normal.     Comments: Gait sure and steady in clinic; bilateral hand grasp equal 5/5  Psychiatric:        Attention and Perception: Attention and perception normal.        Mood and Affect: Mood and affect normal.        Speech: Speech normal.        Behavior: Behavior normal. Behavior is cooperative.        Thought Content: Thought content normal.        Cognition and Memory: Cognition and memory normal.         Judgment: Judgment normal.           Assessment & Plan:  A-thumb wart subsequent encounter  P--Treated with shaving callous with #10 blade until pinpoint of blood noted and slight tenderness. Band-Aidandgenericneosporinointment UDapplied from clinic stock.May buy salicytic acid/compound W2% apply TID starting tomorrowor use duct tape apply strip completely occluding affected area and leave on for one week.  Patient to follow up in 1 week for re-evaluation and repeat shaving at 1130 with me.Patient reported minimal pain after procedure. Discussed with patient to keep clean and dry  Cryotherapy not available in [facility] clinic. PCM may have available. Discussed with patient 2-3 treatments may be required to eradicate wart. Exitcare handout on wartsprinted andgiven to patien  Patient notified  to expect scab at treated area but if erythema, purulent drainage, red streak radiating up hand/thumb (signs of infection)to return to clinic for reevaluation. Patient agreed with plan of care,verbalized understanding of instructions/information and had no further questions at this time.

## 2020-12-10 ENCOUNTER — Ambulatory Visit: Payer: Self-pay | Admitting: Registered Nurse

## 2020-12-10 ENCOUNTER — Encounter: Payer: Self-pay | Admitting: Registered Nurse

## 2020-12-10 ENCOUNTER — Other Ambulatory Visit: Payer: Self-pay

## 2020-12-10 VITALS — BP 128/98 | HR 80 | Temp 97.4°F

## 2020-12-10 DIAGNOSIS — R195 Other fecal abnormalities: Secondary | ICD-10-CM

## 2020-12-10 DIAGNOSIS — R030 Elevated blood-pressure reading, without diagnosis of hypertension: Secondary | ICD-10-CM

## 2020-12-10 DIAGNOSIS — B079 Viral wart, unspecified: Secondary | ICD-10-CM

## 2020-12-10 NOTE — Progress Notes (Signed)
Subjective:    Patient ID: Olivia Hamilton, female    DOB: 1965/09/20, 55 y.o.   MRN: [id number]  54y/o caucasian female established patient here for wart shave down right thumb reoccurrence.  Patient also reported intermittent loose stools diarrhea not daily since her mother's funeral wondering if she has clostridium difficile as mother had it prior to death  Patient has had increased stressors at work and this morning stressful.  She has noticed loose stools occur when increased stress at work.  Denied black, coffee grounds or bright red blood in stools, fever, chills, nausea or vomiting.     Review of Systems  Constitutional: Positive for fatigue. Negative for activity change, appetite change, chills, diaphoresis and fever.  HENT: Negative for trouble swallowing and voice change.   Eyes: Negative for photophobia and visual disturbance.  Respiratory: Negative for cough, choking, shortness of breath, wheezing and stridor.   Cardiovascular: Negative for chest pain and palpitations.  Gastrointestinal: Negative for abdominal distention, abdominal pain, anal bleeding, blood in stool, constipation, nausea and vomiting.  Endocrine: Negative for cold intolerance and heat intolerance.  Genitourinary: Negative for difficulty urinating.  Musculoskeletal: Negative for gait problem.  Skin: Positive for rash.  Allergic/Immunologic: Positive for environmental allergies. Negative for food allergies.  Neurological: Negative for dizziness, tremors, seizures, syncope, facial asymmetry, speech difficulty, weakness, light-headedness and numbness.  Hematological: Negative for adenopathy. Does not bruise/bleed easily.  Psychiatric/Behavioral: Negative for agitation, confusion and sleep disturbance.       Objective:   Physical Exam Constitutional:  General: She is awake. She isnot in acute distress. Appearance: Normal appearance. She iswell-developedand well-groomed. She isobese. She is  notill-appearing,toxic-appearingor diaphoretic.  HENT:  Head: Normocephalicand atraumatic.  Jaw: There is normal jaw occlusion.  Right Ear: Hearingand external earnormal.  Left Ear: Hearingand external earnormal.  Nose: Nose normal.  Mouth/Throat:  Mouth: No angioedema.  Pharynx: Oropharynx is clear.  Eyes:  General: Lidsare normal. Vision grossly intact.Gaze aligned appropriately.Allergic shinerpresent. No visual field deficitor scleral icterus.  Right eye: No discharge.  Left eye: No discharge.  Extraocular Movements: Extraocular movements intact.  Conjunctiva/sclera: Conjunctivae normal.  Pupils: Pupils are equal, round, and reactive to light.  Neck:  Thyroid: No thyromegaly.  Trachea: Tracheanormal.  Cardiovascular:  Rate and Rhythm: Normal rateand regular rhythm.  Pulses: Normal pulses.  Radial pulses are 2+on the right side and 2+on the left side.  Pulmonary:  Effort: Pulmonary effort is normal. Norespiratory distress.  Breath sounds: Normal breath soundsand air entry. Nostridor,decreased air movementor transmitted upper airway sounds. Nodecreased breath sounds,wheezing,rhonchior rales.  Comments: spoke full sentences without difficulty; no cough observed in exam room Abdominal:  General: Abdomen is flat.  Musculoskeletal:  General: Tendernesspresent. No swelling,deformityor signs of injury.Normal range of motion.  Right shoulder: Normal.  Left shoulder: Normal.  Right elbow: Normal.  Left elbow: Normal.  Right hand: Tendernesspresent. No swelling,deformity,lacerationsor bony tenderness.Normal range of motion.Normal strength.Normal sensation. There is nodisruption of two-point discrimination.Normal capillary refill.Normal pulse.  Left hand: Normal.  Cervical back: Normal range of motionand neck supple. Noedema,erythema,signs  of trauma,rigidity,torticollisor crepitus. Nopain with movement.Normal range of motion.  Lymphadenopathy:  Head:  Right side of head: No submental,submandibularor preauricularadenopathy.  Left side of head: No submental,submandibularor preauricularadenopathy.  Cervical: No cervical adenopathy.  Right cervical: No superficialcervical adenopathy. Left cervical: No superficialcervical adenopathy.  Skin: General: Skin is warmand dry.  Capillary Refill: Capillary refill takes less than 2 seconds.  Coloration: Skin is not ashen,cyanotic,jaundiced,mottled,paleor sallow.  Findings: Lesionand rashpresent. No abrasion,abscess,acne,bruising,burn,ecchymosis,erythema,signs of  injury,laceration,petechiaeor wound. Rash ispapularand scaling. Rash is notcrusting,macular,nodular,purpuric,pustular,urticarialor vesicular.  Nails: There is no clubbing.   Comments: DIP/PIP/MCP joints without pain/erythema/swelling bilateral hands; Cleansed affected area with iodine then shaved palmar right thumb lesion until pinpoint bleeding and slight tenderness; dressed with triple antibiotic and fingertip bandaid palmar right thumb distal phalanx after procedure Neurological:  General: No focal deficitpresent.  Mental Status: She is alertand oriented to person, place, and time. Mental status isat baseline.  GCS: GCS eye subscore is 4. GCS verbal subscore is5. GCS motor subscore is6.  Cranial Nerves: Cranial nerves are intact. Nocranial nerve deficit,dysarthriaor facial asymmetry.  Sensory: Sensation is intact. Nosensory deficit.  Motor: Motor function is intact. Noweakness,tremor,atrophy,abnormal muscle toneor seizure activity.  Coordination: Coordination is intact.Coordination normal.  Gait: Gait is intact.Gaitnormal.  Comments: Gait sure and steady in clinic; bilateral hand grasp equal  5/5 Psychiatric:  Attention and Perception: Attentionand perceptionnormal.  Mood and Affect: Moodand affectnormal.  Speech: Speechnormal.  Behavior: Behaviornormal. Behavior is cooperative.  Thought Content: Thought contentnormal.  Cognition and Memory: Cognitionand memorynormal.  Judgment: Judgmentnormal.         Assessment & Plan:  A-thumb wartsubsequentencounter, change in stool consistency, elevated blood pressure without hypertension diagnosis  P--Treated with shaving callous with #10 blade until pinpoint of blood noted and slight tenderness. Band-Aidandgenericneosporinointment UDapplied from clinic stock.May buy salicytic acid/compound W2% apply TID starting tomorrowor use duct tape apply strip completely occluding affected area and leave on for one week.  Patient to follow up in 1 week for re-evaluation for repeat shaving if lesion reoccurs (this was second shaving).Patient reported minimal pain after procedure. Discussed with patient to keep clean and dry  Cryotherapy not available in [facility] clinic. PCM may have available. Discussed with patient 2-3 treatments may be required to eradicate wart. Exitcare handout on warts previously given.  Patient notified to expect scab at treated area but if erythema, purulent drainage, red streak radiating up hand/thumb (signs of infection)to return to clinic for reevaluation.Patient agreed with plan of care,verbalized understanding of instructions/information and had no further questions at this time.  Discussed stress can affect GI system.  Discussed restarting her exercise walking program with patient and will need to gradually ease back in/increase time and distance 10% per week.  Discussed if loose stools daily, black/coffee ground or bright red blood, stomach pain to follow up for re-evaluation consider testing for c. Diff.  Exitcare handout on diarrhea, foods to relieve  diarrhea and c. Difficile infection sent to mychart.  Patient verbalized understanding information/instructions, agreed with plan of care and had no further questions at this time.  Decrease caffeine intake.  ER if chest pain, worst headache of life, dyspnea or visual changes for re-evaluation.  Follow up with RN Rolly Salter over the next two weeks for repeat BP check. Restart exercise program and weight loss efforts.   Exitcare handout preventing hypertension and dash eating plan to my chart.  Patient verbalized understanding information/instructions, agreed with plan of care and had no further questions at this time.

## 2020-12-10 NOTE — Patient Instructions (Addendum)
https://www.nhlbi.nih.gov/files/docs/public/heart/dash_brief.pdf">  DASH Eating Plan DASH stands for Dietary Approaches to Stop Hypertension. The DASH eating plan is a healthy eating plan that has been shown to:  Reduce high blood pressure (hypertension).  Reduce your risk for type 2 diabetes, heart disease, and stroke.  Help with weight loss. What are tips for following this plan? Reading food labels  Check food labels for the amount of salt (sodium) per serving. Choose foods with less than 5 percent of the Daily Value of sodium. Generally, foods with less than 300 milligrams (mg) of sodium per serving fit into this eating plan.  To find whole grains, look for the word "whole" as the first word in the ingredient list. Shopping  Buy products labeled as "low-sodium" or "no salt added."  Buy fresh foods. Avoid canned foods and pre-made or frozen meals. Cooking  Avoid adding salt when cooking. Use salt-free seasonings or herbs instead of table salt or sea salt. Check with your health care provider or pharmacist before using salt substitutes.  Do not fry foods. Cook foods using healthy methods such as baking, boiling, grilling, roasting, and broiling instead.  Cook with heart-healthy oils, such as olive, canola, avocado, soybean, or sunflower oil. Meal planning  Eat a balanced diet that includes: ? 4 or more servings of fruits and 4 or more servings of vegetables each day. Try to fill one-half of your plate with fruits and vegetables. ? 6-8 servings of whole grains each day. ? Less than 6 oz (170 g) of lean meat, poultry, or fish each day. A 3-oz (85-g) serving of meat is about the same size as a deck of cards. One egg equals 1 oz (28 g). ? 2-3 servings of low-fat dairy each day. One serving is 1 cup (237 mL). ? 1 serving of nuts, seeds, or beans 5 times each week. ? 2-3 servings of heart-healthy fats. Healthy fats called omega-3 fatty acids are found in foods such as walnuts,  flaxseeds, fortified milks, and eggs. These fats are also found in cold-water fish, such as sardines, salmon, and mackerel.  Limit how much you eat of: ? Canned or prepackaged foods. ? Food that is high in trans fat, such as some fried foods. ? Food that is high in saturated fat, such as fatty meat. ? Desserts and other sweets, sugary drinks, and other foods with added sugar. ? Full-fat dairy products.  Do not salt foods before eating.  Do not eat more than 4 egg yolks a week.  Try to eat at least 2 vegetarian meals a week.  Eat more home-cooked food and less restaurant, buffet, and fast food.   Lifestyle  When eating at a restaurant, ask that your food be prepared with less salt or no salt, if possible.  If you drink alcohol: ? Limit how much you use to:  0-1 drink a day for women who are not pregnant.  0-2 drinks a day for men. ? Be aware of how much alcohol is in your drink. In the [place] one drink equals one 12 oz bottle of beer (355 mL), one 5 oz glass of wine (148 mL), or one 1 oz glass of hard liquor (44 mL). General information  Avoid eating more than 2,300 mg of salt a day. If you have hypertension, you may need to reduce your sodium intake to 1,500 mg a day.  Work with your health care provider to maintain a healthy body weight or to lose weight. Ask what an ideal weight is for   you.  Get at least 30 minutes of exercise that causes your heart to beat faster (aerobic exercise) most days of the week. Activities may include walking, swimming, or biking.  Work with your health care provider or dietitian to adjust your eating plan to your individual calorie needs. What foods should I eat? Fruits All fresh, dried, or frozen fruit. Canned fruit in natural juice (without added sugar). Vegetables Fresh or frozen vegetables (raw, steamed, roasted, or grilled). Low-sodium or reduced-sodium tomato and vegetable juice. Low-sodium or reduced-sodium tomato sauce and tomato paste.  Low-sodium or reduced-sodium canned vegetables. Grains Whole-grain or whole-wheat bread. Whole-grain or whole-wheat pasta. Brown rice. Oatmeal. Quinoa. Bulgur. Whole-grain and low-sodium cereals. Pita bread. Low-fat, low-sodium crackers. Whole-wheat flour tortillas. Meats and other proteins Skinless chicken or turkey. Ground chicken or turkey. Pork with fat trimmed off. Fish and seafood. Egg whites. Dried beans, peas, or lentils. Unsalted nuts, nut butters, and seeds. Unsalted canned beans. Lean cuts of beef with fat trimmed off. Low-sodium, lean precooked or cured meat, such as sausages or meat loaves. Dairy Low-fat (1%) or fat-free (skim) milk. Reduced-fat, low-fat, or fat-free cheeses. Nonfat, low-sodium ricotta or cottage cheese. Low-fat or nonfat yogurt. Low-fat, low-sodium cheese. Fats and oils Soft margarine without trans fats. Vegetable oil. Reduced-fat, low-fat, or light mayonnaise and salad dressings (reduced-sodium). Canola, safflower, olive, avocado, soybean, and sunflower oils. Avocado. Seasonings and condiments Herbs. Spices. Seasoning mixes without salt. Other foods Unsalted popcorn and pretzels. Fat-free sweets. The items listed above may not be a complete list of foods and beverages you can eat. Contact a dietitian for more information. What foods should I avoid? Fruits Canned fruit in a light or heavy syrup. Fried fruit. Fruit in cream or butter sauce. Vegetables Creamed or fried vegetables. Vegetables in a cheese sauce. Regular canned vegetables (not low-sodium or reduced-sodium). Regular canned tomato sauce and paste (not low-sodium or reduced-sodium). Regular tomato and vegetable juice (not low-sodium or reduced-sodium). Pickles. Olives. Grains Baked goods made with fat, such as croissants, muffins, or some breads. Dry pasta or rice meal packs. Meats and other proteins Fatty cuts of meat. Ribs. Fried meat. Bacon. Bologna, salami, and other precooked or cured meats, such as  sausages or meat loaves. Fat from the back of a pig (fatback). Bratwurst. Salted nuts and seeds. Canned beans with added salt. Canned or smoked fish. Whole eggs or egg yolks. Chicken or turkey with skin. Dairy Whole or 2% milk, cream, and half-and-half. Whole or full-fat cream cheese. Whole-fat or sweetened yogurt. Full-fat cheese. Nondairy creamers. Whipped toppings. Processed cheese and cheese spreads. Fats and oils Butter. Stick margarine. Lard. Shortening. Ghee. Bacon fat. Tropical oils, such as coconut, palm kernel, or palm oil. Seasonings and condiments Onion salt, garlic salt, seasoned salt, table salt, and sea salt. Worcestershire sauce. Tartar sauce. Barbecue sauce. Teriyaki sauce. Soy sauce, including reduced-sodium. Steak sauce. Canned and packaged gravies. Fish sauce. Oyster sauce. Cocktail sauce. Store-bought horseradish. Ketchup. Mustard. Meat flavorings and tenderizers. Bouillon cubes. Hot sauces. Pre-made or packaged marinades. Pre-made or packaged taco seasonings. Relishes. Regular salad dressings. Other foods Salted popcorn and pretzels. The items listed above may not be a complete list of foods and beverages you should avoid. Contact a dietitian for more information. Where to find more information  National Heart, Lung, and Blood Institute: www.nhlbi.nih.gov  American Heart Association: www.heart.org  Academy of Nutrition and Dietetics: www.eatright.org  National Kidney Foundation: www.kidney.org Summary  The DASH eating plan is a healthy eating plan that has been shown to   reduce high blood pressure (hypertension). It may also reduce your risk for type 2 diabetes, heart disease, and stroke.  When on the DASH eating plan, aim to eat more fresh fruits and vegetables, whole grains, lean proteins, low-fat dairy, and heart-healthy fats.  With the DASH eating plan, you should limit salt (sodium) intake to 2,300 mg a day. If you have hypertension, you may need to reduce your  sodium intake to 1,500 mg a day.  Work with your health care provider or dietitian to adjust your eating plan to your individual calorie needs. This information is not intended to replace advice given to you by your health care provider. Make sure you discuss any questions you have with your health care provider. Document Revised: 07/28/2019 Document Reviewed: 07/28/2019 Elsevier Patient Education  2021 Elsevier Inc. Preventing Hypertension Hypertension, also called high blood pressure, is when the force of blood pumping through the arteries is too strong. Arteries are blood vessels that carry blood from the heart throughout the body. Often, hypertension does not cause symptoms until blood pressure is very high. It is important to have your blood pressure checked regularly. Diet and lifestyle changes can help you prevent hypertension, and they may make you feel better overall and improve your quality of life. If you already have hypertension, you may control it with diet and lifestyle changes, as well as with medicine. How can this condition affect me? Over time, hypertension can damage the arteries and decrease blood flow to important parts of the body, including the brain, heart, and kidneys. By keeping your blood pressure in a healthy range, you can help prevent complications like heart attack, heart failure, stroke, kidney failure, and vascular dementia. What can increase my risk?  Being an older adult. Older people are more often affected.  Having family members who have had high blood pressure.  Being obese.  Being female. Males are more likely to have high blood pressure.  Drinking too much alcohol or caffeine.  Smoking or using illegal drugs.  Taking certain medicines, such as antidepressants, decongestants, birth control pills, and NSAIDs, such as ibuprofen.  Having thyroid problems.  Having certain tumors. What actions can I take to prevent or manage this condition? Work with  your health care provider to make a hypertension prevention plan that works for you. Follow your plan and keep all follow-up visits as told by your health care provider. Diet changes Maintain a healthy diet. This includes:  Eating less salt (sodium). Ask your health care provider how much sodium is safe for you to have. The general recommendation is to have less than 1 tsp (2,300 mg) of sodium a day. ? Do not add salt to your food. ? Choose low-sodium options when grocery shopping and eating out.  Limiting fats in your diet. You can do this by eating low-fat or fat-free dairy products and by eating less red meat.  Eating more fruits, vegetables, and whole grains. Make a goal to eat: ? 1-2 cups of fresh fruits and vegetables each day. ? 3-4 servings of whole grains each day.  Avoiding foods and beverages that have added sugars.  Eating fish that contain healthy fats (omega-3 fatty acids), such as mackerel or salmon. If you need help putting together a healthy eating plan, try the DASH diet. This diet is high in fruits, vegetables, and whole grains. It is low in sodium, red meat, and added sugars. DASH stands for Dietary Approaches to Stop Hypertension.   Lifestyle changes Lose weight if  you are overweight. Losing just 3?5% of your body weight can help prevent or control hypertension. For example, if your present weight is 200 lb (91 kg), a loss of 3-5% of your weight means losing 6-10 lb (2.7-4.5 kg). Ask your health care provider to help you with a diet and exercise plan to safely lose weight. Other recommendations usually include:  Get enough exercise. Do at least 150 minutes of moderate-intensity exercise each week. You could do this in short exercise sessions several times a day, or you could do longer exercise sessions a few times a week. For example, you could take a brisk 10-minute walk or bike ride, 3 times a day, for 5 days a week.  Find ways to reduce stress, such as exercising,  meditating, listening to music, or taking a yoga class. If you need help reducing stress, ask your health care provider.  Do not use any products that contain nicotine or tobacco, such as cigarettes, e-cigarettes, and chewing tobacco. If you need help quitting, ask your health care provider. Chemicals in tobacco and nicotine products raise your blood pressure each time you use them. If you need help quitting, ask your health care provider.  Learn how to check your blood pressure at home. Make sure that you know your personal target blood pressure, as told by your health care provider.  Try to sleep 7-9 hours per night.   Alcohol use  Do not drink alcohol if: ? Your health care provider tells you not to drink. ? You are pregnant, may be pregnant, or are planning to become pregnant.  If you drink alcohol: ? Limit how much you use to:  0-1 drink a day for women.  0-2 drinks a day for men. ? Be aware of how much alcohol is in your drink. In the [place] one drink equals one 12 oz bottle of beer (355 mL), one 5 oz glass of wine (148 mL), or one 1 oz glass of hard liquor (44 mL). Medicines In addition to diet and lifestyle changes, your health care provider may recommend medicines to help lower your blood pressure. In general:  You may need to try a few different medicines to find what works best for you.  You may need to take more than one medicine.  Take over-the-counter and prescription medicines only as told by your health care provider. Questions to ask your health care provider  What is my blood pressure goal?  How can I lower my risk for high blood pressure?  How should I monitor my blood pressure at home? Where to find support Your health care provider can help you prevent hypertension and help you keep your blood pressure at a healthy level. Your local hospital or your community may also provide support services and prevention programs. The American Heart Association offers an  online support network at supportnetwork.heart.org Where to find more information Learn more about hypertension from:  National Heart, Lung, and Blood Institute: PopSteam.iswww.nhlbi.nih.gov  Centers for Disease Control and Prevention: FootballExhibition.com.brwww.cdc.gov  American Academy of Family Physicians: familydoctor.org Learn more about the DASH diet from:  National Heart, Lung, and Blood Institute: PopSteam.iswww.nhlbi.nih.gov Contact a health care provider if:  You think you are having a reaction to medicines you have taken.  You have recurrent headaches or feel dizzy.  You have swelling in your ankles.  You have trouble with your vision. Get help right away if:  You have sudden, severe chest, back, or abdominal pain or discomfort.  You have shortness  of breath.  You have a sudden, severe headache. These symptoms may represent a serious problem that is an emergency. Do not wait to see if the symptoms will go away. Get medical help right away. Call your local emergency services (911 in the [place] Do not drive yourself to the hospital.  Summary  Hypertension often does not cause any symptoms until blood pressure is very high. It is important to get your blood pressure checked regularly.  Diet and lifestyle changes are important steps in preventing hypertension.  By keeping your blood pressure in a healthy range, you may prevent complications like heart attack, heart failure, stroke, and kidney failure.  Work with your health care provider to make a hypertension prevention plan that works for you. This information is not intended to replace advice given to you by your health care provider. Make sure you discuss any questions you have with your health care provider. Document Revised: 07/25/2019 Document Reviewed: 07/25/2019 Elsevier Patient Education  2021 Elsevier Inc. Food Choices to Help Relieve Diarrhea, Adult Diarrhea can make you feel weak and cause you to become dehydrated. It is important to choose the  right foods and drinks to:  Relieve diarrhea.  Replace lost fluids and nutrients.  Prevent dehydration. What are tips for following this plan? Relieving diarrhea  Avoid foods that make your diarrhea worse. These may include: ? Foods and beverages sweetened with high-fructose corn syrup, honey, or sweeteners such as xylitol, sorbitol, and mannitol. ? Fried, greasy, or spicy foods. ? Raw fruits and vegetables.  Eat foods that are rich in probiotics. These include foods such as yogurt and fermented milk products. Probiotics can help increase healthy bacteria in your stomach and intestines (gastrointestinal tract or GI tract). This may help digestion and stop diarrhea.  If you have lactose intolerance, avoid dairy products. These may make your diarrhea worse.  Take medicine to help stop diarrhea only as told by your health care provider. Replacing nutrients  Eat bland, easy-to-digest foods in small amounts as you are able, until your diarrhea starts to get better. These foods include bananas, applesauce, rice, toast, and crackers.  Gradually reintroduce nutrient-rich foods as tolerated or as told by your health care provider. This includes: ? Well-cooked protein foods, such as eggs, lean meats like fish or chicken without skin, and tofu. ? Peeled, seeded, and soft-cooked fruits and vegetables. ? Low-fat dairy products. ? Whole grains.  Take vitamin and mineral supplements as told by your health care provider.   Preventing dehydration  Start by sipping water or a solution to prevent dehydration (oral rehydration solution, ORS). This is a drink that helps replace fluids and minerals your body has lost. You can buy an ORS at pharmacies and retail stores.  Try to drink at least 8-10 cups (2,000-2,500 mL) of fluid each day to help replace lost fluids. If you have urine that is pale yellow, you are getting enough fluids.  You may drink other liquids in addition to water, such as fruit  juice that you have added water to (diluted fruit juice) or low-calorie sports drinks, as tolerated or as told by your health care provider.  Avoid drinks with caffeine, such as coffee, tea, or soft drinks.  Avoid alcohol.   Summary  When you have diarrhea, it is important to choose the right foods and drinks to relieve diarrhea, to replace lost fluids and nutrients, and to prevent dehydration.  Make sure you drink enough fluid to keep your urine pale yellow.  You  may benefit from eating bland foods at first. Gradually reintroduce healthy, nutrient-rich foods as tolerated or as told by your health care provider.  Avoid foods that make your diarrhea worse, such as fried, greasy, or spicy foods. This information is not intended to replace advice given to you by your health care provider. Make sure you discuss any questions you have with your health care provider. Document Revised: 10/10/2019 Document Reviewed: 10/10/2019 Elsevier Patient Education  2021 Elsevier Inc. Diarrhea, Adult Diarrhea is frequent loose and watery bowel movements. Diarrhea can make you feel weak and cause you to become dehydrated. Dehydration can make you tired and thirsty, cause you to have a dry mouth, and decrease how often you urinate. Diarrhea typically lasts 2-3 days. However, it can last longer if it is a sign of something more serious. It is important to treat your diarrhea as told by your health care provider. Follow these instructions at home: Eating and drinking Follow these recommendations as told by your health care provider:  Take an oral rehydration solution (ORS). This is an over-the-counter medicine that helps return your body to its normal balance of nutrients and water. It is found at pharmacies and retail stores.  Drink plenty of fluids, such as water, ice chips, diluted fruit juice, and low-calorie sports drinks. You can drink milk also, if desired.  Avoid drinking fluids that contain a lot of  sugar or caffeine, such as energy drinks, sports drinks, and soda.  Eat bland, easy-to-digest foods in small amounts as you are able. These foods include bananas, applesauce, rice, lean meats, toast, and crackers.  Avoid alcohol.  Avoid spicy or fatty foods.      Medicines  Take over-the-counter and prescription medicines only as told by your health care provider.  If you were prescribed an antibiotic medicine, take it as told by your health care provider. Do not stop using the antibiotic even if you start to feel better. General instructions  Wash your hands often using soap and water. If soap and water are not available, use a hand sanitizer. Others in the household should wash their hands as well. Hands should be washed: ? After using the toilet or changing a diaper. ? Before preparing, cooking, or serving food. ? While caring for a sick person or while visiting someone in a hospital.  Drink enough fluid to keep your urine pale yellow.  Rest at home while you recover.  Watch your condition for any changes.  Take a warm bath to relieve any burning or pain from frequent diarrhea episodes.  Keep all follow-up visits as told by your health care provider. This is important.   Contact a health care provider if:  You have a fever.  Your diarrhea gets worse.  You have new symptoms.  You cannot keep fluids down.  You feel light-headed or dizzy.  You have a headache.  You have muscle cramps. Get help right away if:  You have chest pain.  You feel extremely weak or you faint.  You have bloody or black stools or stools that look like tar.  You have severe pain, cramping, or bloating in your abdomen.  You have trouble breathing or you are breathing very quickly.  Your heart is beating very quickly.  Your skin feels cold and clammy.  You feel confused.  You have signs of dehydration, such as: ? Dark urine, very little urine, or no urine. ? Cracked lips. ? Dry  mouth. ? Sunken eyes. ? Sleepiness. ? Weakness.  Summary  Diarrhea is frequent loose and watery bowel movements. Diarrhea can make you feel weak and cause you to become dehydrated.  Drink enough fluids to keep your urine pale yellow.  Make sure that you wash your hands after using the toilet. If soap and water are not available, use hand sanitizer.  Contact a health care provider if your diarrhea gets worse or you have new symptoms.  Get help right away if you have signs of dehydration. This information is not intended to replace advice given to you by your health care provider. Make sure you discuss any questions you have with your health care provider. Document Revised: 01/10/2019 Document Reviewed: 01/28/2018 Elsevier Patient Education  2021 Elsevier Inc. Clostridioides Difficile Infection Clostridioides difficile infection, or C. diff, is an infection that is caused by C. diff germs (bacteria). This infection may happen after you take antibiotics that kill other germs and let C. diff germs grow. C. diff can be spread from person to person (is contagious). What are the causes?  Taking certain antibiotics.  Coming in contact with people, food, or things that have C. diff. What increases the risk?  Taking certain antibiotics for a long time.  Staying in a hospital or long-term care facility for a long time.  Being age 53 or older.  Having had C. diff before or been exposed to C. diff.  Having a weak disease-fighting system (immune system).  Taking medicines that treat stomach acid.  Having serious health problems, including: ? Colon cancer. ? Inflammatory bowel disease (IBD).  Having had a procedure or surgery on your digestive system. What are the signs or symptoms?  Watery poop (diarrhea).  Fever.  Not feeling hungry.  Feeling like you may vomit.  Swelling, pain, cramps, or a tender belly. How is this treated? Treatment may include:  Stopping the  antibiotics that caused the C. diff infection.  Taking antibiotics that kill C. diff.  Placing poop from a healthy person into your colon (fecal transplant).  Doing surgery to take out the infected part of the colon. Follow these instructions at home: Medicines  Take over-the-counter and prescription medicines only as told by your doctor.  Take antibiotic medicine as told by your doctor. Do not stop taking it even if you start to feel better.  Do not take medicines to treat watery poop unless your doctor tells you to. Eating and drinking  Follow instructions from your doctor about what to eat and drink. This may include eating bland foods in small amounts, such as: ? Bananas. ? Applesauce. ? Rice. ? Lean meats. ? Toast. ? Crackers.  To prevent loss of fluid in your body (dehydration): ? Take in enough fluids to keep your pee pale yellow. This includes water, ice chips, clear fruit juice with water added to it, or low-calorie sports drinks. ? Take an ORS (oral rehydration solution). This drink is sold in pharmacies and retail stores.  Avoid milk, caffeine, and alcohol.   General instructions  Wash your hands often with soap and water. Do this for at least 20 seconds.  Take a bath or shower every day.  Return to your normal activities when your doctor says that it is safe.  Keep all follow-up visits. How is this prevented? Personal hygiene  Wash your hands often with soap and water. Do this for at least 20 seconds.  Wash your hands before you cook and after you use the bathroom.  Other people should wash their hands too, especially: ?  People who live with you. ? People who visit you in a hospital or clinic.   Contact precautions  If you get watery poop while you are in the hospital or a long-term care facility, tell your doctor right away.  When you visit someone in the hospital or a long-term care facility, wear a gown, gloves, or other protection.  If  possible: ? Stay away from people who have diarrhea. ? Use a separate bathroom if you are sick and live with other people. Clean environment  Keep your home clean. ? Clean your home every day for at least a week after you leave the hospital.  Clean surfaces that you touch every day. Use a product that has a 10% chlorine bleach solution. Be sure to: ? Read the label on your product to make sure that the product will kill the germs on your surfaces. ? Clean toilets and flush handles, bathtubs, sinks, doorknobs and handles, countertops, and work surfaces.  If you are in the hospital, make sure the surfaces in your room are cleaned each day. Tell someone right away if body fluids have splashed or spilled. Clothes and linens  Wash clothes and linens using laundry soap that has chlorine bleach. Be sure to: ? Use powder soap instead of liquid. ? Clean your washing machine once a month. To do this, turn on the hot setting with only soap in it. Contact a doctor if:  Your symptoms do not get better or they get worse.  Your symptoms go away and then come back.  You have a fever.  You have new symptoms. Get help right away if:  Your belly is more tender or you have more pain.  Your poop is mostly bloody.  Your poop looks black.  You vomit after you eat or drink.  You have signs of not having enough fluids in your body. These include: ? Dark yellow pee, very little pee, or no pee. ? Cracked lips or dry mouth. ? No tears when you cry. ? Sunken eyes. ? Feeling sleepy. ? Feeling weak or dizzy. Summary  C. diff infection is an infection that may happen after you take antibiotic medicines.  Symptoms include watery poop, fever, not feeling hungry, or feeling like you may vomit.  Treatment includes stopping the antibiotics that made you sick and taking antibiotics that kill the C. diff germs. Poop from a healthy person may also be placed into your colon.  To prevent C. diff  infectionfrom spreading, wash hands often with soap and water. Do this for at least 20 seconds. Keep your home clean. This information is not intended to replace advice given to you by your health care provider. Make sure you discuss any questions you have with your health care provider. Document Revised: 12/14/2019 Document Reviewed: 12/14/2019 Elsevier Patient Education  2021 [employer].

## 2020-12-17 ENCOUNTER — Telehealth: Payer: Self-pay | Admitting: Registered Nurse

## 2020-12-17 DIAGNOSIS — R110 Nausea: Secondary | ICD-10-CM

## 2020-12-17 DIAGNOSIS — H1013 Acute atopic conjunctivitis, bilateral: Secondary | ICD-10-CM

## 2020-12-17 DIAGNOSIS — R030 Elevated blood-pressure reading, without diagnosis of hypertension: Secondary | ICD-10-CM

## 2020-12-17 DIAGNOSIS — Z6834 Body mass index (BMI) 34.0-34.9, adult: Secondary | ICD-10-CM

## 2020-12-18 ENCOUNTER — Encounter: Payer: Self-pay | Admitting: Registered Nurse

## 2020-12-18 MED ORDER — CARBOXYMETHYLCELLULOSE SOD PF 0.5 % OP SOLN: 1.0000 [drp] | Freq: Three times a day (TID) | OPHTHALMIC | 0 refills | Status: AC | PRN

## 2020-12-18 MED ORDER — ONDANSETRON 4 MG PO TBDP: 4.0000 mg | ORAL_TABLET | Freq: Three times a day (TID) | ORAL | 0 refills | Status: AC | PRN

## 2020-12-18 MED ORDER — KETOTIFEN FUMARATE 0.025 % OP SOLN: 1.0000 [drp] | Freq: Two times a day (BID) | OPHTHALMIC | 0 refills | Status: AC

## 2020-12-18 NOTE — Telephone Encounter (Signed)
Patient reported Monday at work 12/16/2020 eyes started hurting thought it was eye strain from working on spreadsheets all day on computer.  Took her allergy medication that evening claritin, flonase and nasal saline that evening.  Woke up 0600 took tylenol cold and sinus medicine 2 tabs when she woke up due to sinus pressure then started throwing up. Repetitive vomiting without nausea and loose frequent stools yesterday couldn't hold down any liquids until 1500 when emesis stopped finally.  Clammy also during emesis denied fever/chills. Legs started hurting last night not full body aches.  Patient believes this is allergy and migraine related but not her typical symptoms of migraine where she throws up and feels better.  Today cheeks dull ache face also.  Denied fever.  Eyes were hurting also until 0100 today then resolved. Discussed with patient not covid vaccinated, this could be flu which is common in community also or the viral gastroenteritis on top of a migraine.  Patient scheduled for covid and flu testing at [facility] later today [address].  Patient did not develop symptoms of cough, trouble breathing, chest pain, nausea, or sore throat.   Pt advised to work from home until test results available.  If flu and covid negative will repeat covid testing in 2-3 days.  Day 0 12/16/2020  Test Day 2 [facility].  Day 5 12/21/20  Day 10 12/26/20  Pt lives in [place] prefers [place] for testing. Reviewed drive up testing instructions and directions with pt, ie attend appt time, remain in vehicle, wear mask, results back in 1-2 days. Will f/u pt on 12/19/2020 for results. If symptoms improving and no fever, vomiting, diarrhea in previous >24hrs without antipyretics, pt may return to work onsite 12/20/09 wearing mask as private office.  Second covid test could be home test 4/15 or 4/16.  Free government tests ordered for patient today as she has never ordered them for herself.  Discussed may order  second box of tests if needed later this year from [place] government website.  Email sent with confirmation code/NOPP/swabbing and testing instructions from [facility] as patient assisted with making test appt today. [email or web address]  Reviewed possible Covid sx including cough, shortness of breath with exertion or at rest, runny nose, congestion, sinus pain/pressure, sore throat, fever/chills, body aches, fatigue, loss of taste/smell, GI symptoms of nausea/vomiting/diarrhea. Also reviewed same day/emergent eval/ER precautions of dizziness/syncope, confusion, blue tint to lips/face, severe shortness of breath/difficulty breathing/wheezing.   Patient lives alone.  Patient PCM moved to [facility] and she needs to establish with new PCM.  Considering another provider in [place] office at this time asking if I can prescribe her some migraine medicine.  Her sister on dissolving under the tongue medicine that she would like to try.  Wants prn only medicine not daily.  Discussed blood pressure has been elevated since at least 2018 on chart review.  This could be causing headaches also.  This week weather changes/barometric pressure changes and stress at work and still grieving death of her mother triggering migraines per patient.  Cannot tell me how many migraines she is having per month.  Will keep headache log.  Discussed if weekly then recommend daily preventive medication like propranolol as would also lower her blood pressure.  Patient trying to lose weight/exercise but fatigued and frequent headache impacting work outs.  [facility] would like [facility].  Will have RN Rolly Salter fax labs to their office and enter referral for patient.  Email  sent to patient with phone number and address/contact information.  Patient also having itchy eyes despite claritin would like to restart refresh eye drops and ketotifen.  Electronic Rx sent to her pharmacy of choice.  Still having pressure behind eyes  patient wondering if she should start singulair also.  Discussed will see if eye symptoms resolve with eye drops if not consider trial of singulair 10mg  po qhs.  Will need to discuss black box warning with patient regarding mood changes and currently with mood changes so holding at this time.  Discussed showering prior to going to bed as pollen/dust clings to hair/eyebrows/eyelashes also.  Patient currently showering when waking up.  Local pollen counts high.  Patient reported migraines typically worst spring and fall  Throws up once or twice normally and then no more vomiting and feels better.  Electronic rx zofran 4mg  take 1-2 ODT po q8h prn n/v #20 RF0 as another weather front moving in tomorrow.  Patient to contact sister to see if on nurtec odt or what medication she is taking.  Then will check formulary to see if covered medication and what cash price would be as alternative also.  Discussed with high blood pressure triptans a concern.  Patient to contact me at [facility] or [email or web address] if new or worsening symptoms today.  Patient A&Ox3 no nasal sniffing/congestion/cough/throat clearing noted during 32 minute telephone conversation.  Spoke full sentences without difficulty respirations even and unlabored.  Patient verbalized understanding information/instructions, agreed with plan of care and had no further questions at this time.

## 2020-12-19 ENCOUNTER — Encounter: Payer: Self-pay | Admitting: Registered Nurse

## 2020-12-19 NOTE — Telephone Encounter (Signed)
Patient contacted via telephone reported sister taking maxalt dissolvable under the tongue as needed with good results.  Discussed with patient Maxalt MLT is formulary for [employer] but caution if starting with uncontrolled hypertension.  Will hold starting until patient BP under better control.  Discussed consider propranolol again.  Patient stated has follow up with her thyroid provider first week of May for labs.  Patient to bring copy to clinic so they can be faxed to [facility] staff.  [phone] [address] [place], [place].  Patient will call them for appt.  Referral sent.  Last labs and BP November.  Patient to see RN Rolly Salter for repeat BP check when on site.  Still working from home today due to loose stools "I think you were right I had a migraine and something else going on"  Feeling a little better today and test results still pending. Encouraged hydrated and bland diet.  Nurtec ODT is non-formulary but manufacturer does have discount program available.   Discussed with patient I would call her again tomorrow.  Patient verbalized understanding information/instructions, agreed with plan of care and had no further questions at this time.

## 2020-12-23 NOTE — Telephone Encounter (Signed)
Reviewed RN Rolly Salter note patient ate [employer]aff[employer] for lunch and started to have GI upset again went home to work remote and back to bland diet.  Will follow up with patient via telephone again tomorrow.

## 2020-12-23 NOTE — Telephone Encounter (Signed)
Spoke with pt by phone. She did RTW today onsite as planned. She was feeling well this morning but now nauseous in past hour or so. Feels she advanced diet too quick. Had [employer] for lunch. Planning to leave and work remote for rest of today. Tolerating liquids well so she is going to push fluids and go back to boiled chicken and rice for a few more days.  Cleared to RTW onsite tomorrow as long as no new or recurring sx overnight.

## 2020-12-24 NOTE — Telephone Encounter (Signed)
Patient contacted via telephone and she stated bowel movements now watery but normal color brown.  Middle of night, this am last stools.  Starts to feel better and feeling hungry but if po intake nausea worsens for a while then resolves.  Starts again with new po intake.  "I fell like I am having sea sickness but I have never had motion sickness.  My sister gets motion sickness" Last urinated this am regular yellow.  I have recommended clear fluids and advanced to soft as tolerated.  Avoid dairy, spicy and fried foods until diarrhea resolves. Avoid dehydration drink noncaffeinated beverages (water, ginger ale, soup broth, popsicles, no sugar added gatorade/powerade) to urinate every 2-4 hours pale yellow urine.  It is easy to become dehydrated when having diarrhea along with electrolyte imbalances.  Patient to take  temperature and if less than 100.5 F may take over the counter Imodium per manufacturer's instructions.  Patient sent to [facility] handouts on diarrhea and foods to relieve diarrhea. Medications as directed.  Return to the clinic if symptoms persist or worsen; I have alerted the patient to call if high fever, dehydration, marked weakness, fainting, increased abdominal pain, blood in stool or vomit. Patient instructed recommend staying home 48 hours with diarrhea do not return onsite until resolved x 24 hours. Patient verbalized agreement and understanding of  treatment plan and had no further questions at this time. P2: Hand washing and fitness  Patient A&Ox3 spoke full sentences without difficulty.  HR notified not cleared to return onsite yet will follow up with patient again tomorrow for re-evaluation.

## 2020-12-25 NOTE — Telephone Encounter (Signed)
Patient contacted via telephone stated last diarrhea yesterday afternoon/evening.  Didn't use any imodium.  Tolerated crackers/toast today.  Feeling back to normal and ready to return to work.  Urinating without difficulty.  Spoke full sentences without difficulty no cough/congestion or throat clearing during 5 minute conversation.  Notified patient I would clear her to return onsite tomorrow/send HR message.  Patient to continue bland diet avoid spicy/fried/large portions of meat/dairy until next week.  Patient verbalized understanding information/instructions, agreed with plan of care and had no further questions at this time.

## 2020-12-31 NOTE — Telephone Encounter (Signed)
Patient seen in workcenter.  Discussed repeat blood pressure with RN Rolly Salter.  She was on vacation last week and today first day back in the office.

## 2020-12-31 NOTE — Telephone Encounter (Signed)
Patient also reported she would [facility] to schedule appt.

## 2021-01-13 NOTE — Telephone Encounter (Signed)
Patient returned call and stated on 01/01/21 BP 125/85.  She saw her PCM last Monday and discussed her joining weight loss program and PCM wants her to wait and work on sleeping patterns and provider checking her cortisol levels.  Mental Health first priority with walking, trying to eat breads only one meal a day, regular sleeping pattery.  Follow up scheduled in 3 months and if no weight loss with the recommended goals then consider structured program.  Patient reported she is dealing with elevated blood pressure, thyroid, allergies food and grief.  Patient supported in her plan and will check in with her again prior to [facility] appt.

## 2021-01-23 NOTE — Telephone Encounter (Signed)
Patient reported [facility] contacted her to schedule appt and she made for June.

## 2021-02-18 ENCOUNTER — Encounter (INDEPENDENT_AMBULATORY_CARE_PROVIDER_SITE_OTHER): Payer: Self-pay | Admitting: Family Medicine

## 2021-02-18 ENCOUNTER — Other Ambulatory Visit: Payer: Self-pay

## 2021-02-18 ENCOUNTER — Ambulatory Visit (INDEPENDENT_AMBULATORY_CARE_PROVIDER_SITE_OTHER): Payer: PRIVATE HEALTH INSURANCE | Admitting: Family Medicine

## 2021-02-18 VITALS — BP 135/86 | HR 66 | Temp 97.7°F | Ht 65.0 in | Wt 221.0 lb

## 2021-02-18 DIAGNOSIS — Z9189 Other specified personal risk factors, not elsewhere classified: Secondary | ICD-10-CM | POA: Diagnosis not present

## 2021-02-18 DIAGNOSIS — Z0289 Encounter for other administrative examinations: Secondary | ICD-10-CM

## 2021-02-18 DIAGNOSIS — Z0000 Encounter for general adult medical examination without abnormal findings: Secondary | ICD-10-CM | POA: Insufficient documentation

## 2021-02-18 DIAGNOSIS — E063 Autoimmune thyroiditis: Secondary | ICD-10-CM | POA: Diagnosis not present

## 2021-02-18 DIAGNOSIS — E559 Vitamin D deficiency, unspecified: Secondary | ICD-10-CM | POA: Diagnosis not present

## 2021-02-18 DIAGNOSIS — R0602 Shortness of breath: Secondary | ICD-10-CM | POA: Diagnosis not present

## 2021-02-18 DIAGNOSIS — R030 Elevated blood-pressure reading, without diagnosis of hypertension: Secondary | ICD-10-CM

## 2021-02-18 DIAGNOSIS — R5383 Other fatigue: Secondary | ICD-10-CM | POA: Diagnosis not present

## 2021-02-18 DIAGNOSIS — Z6836 Body mass index (BMI) 36.0-36.9, adult: Secondary | ICD-10-CM

## 2021-02-18 DIAGNOSIS — F3289 Other specified depressive episodes: Secondary | ICD-10-CM | POA: Diagnosis not present

## 2021-02-18 DIAGNOSIS — Z1331 Encounter for screening for depression: Secondary | ICD-10-CM | POA: Insufficient documentation

## 2021-02-18 DIAGNOSIS — E282 Polycystic ovarian syndrome: Secondary | ICD-10-CM

## 2021-02-18 DIAGNOSIS — I10 Essential (primary) hypertension: Secondary | ICD-10-CM | POA: Insufficient documentation
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
## 2021-02-19 ENCOUNTER — Other Ambulatory Visit: Payer: Self-pay

## 2021-02-19 ENCOUNTER — Ambulatory Visit: Payer: PRIVATE HEALTH INSURANCE | Admitting: Family Medicine

## 2021-02-19 ENCOUNTER — Encounter: Payer: Self-pay | Admitting: Family Medicine

## 2021-02-19 VITALS — BP 146/82 | HR 78 | Temp 97.0°F | Ht 65.0 in | Wt 224.3 lb

## 2021-02-19 DIAGNOSIS — E6609 Other obesity due to excess calories: Secondary | ICD-10-CM | POA: Insufficient documentation

## 2021-02-19 DIAGNOSIS — I10 Essential (primary) hypertension: Secondary | ICD-10-CM

## 2021-02-19 DIAGNOSIS — Z6837 Body mass index (BMI) 37.0-37.9, adult: Secondary | ICD-10-CM

## 2021-02-19 LAB — COMPREHENSIVE METABOLIC PANEL
ALT: 12 IU/L (ref 0–32)
AST: 19 IU/L (ref 0–40)
Albumin/Globulin Ratio: 2.2 (ref 1.2–2.2)
Albumin: 4.7 g/dL (ref 3.8–4.9)
Alkaline Phosphatase: 118 IU/L (ref 44–121)
BUN/Creatinine Ratio: 19 (ref 9–23)
BUN: 15 mg/dL (ref 6–24)
Bilirubin Total: 0.5 mg/dL (ref 0.0–1.2)
CO2: 24 mmol/L (ref 20–29)
Calcium: 9.4 mg/dL (ref 8.7–10.2)
Chloride: 102 mmol/L (ref 96–106)
Creatinine, Ser: 0.78 mg/dL (ref 0.57–1.00)
Globulin, Total: 2.1 g/dL (ref 1.5–4.5)
Glucose: 86 mg/dL (ref 65–99)
Potassium: 4.4 mmol/L (ref 3.5–5.2)
Sodium: 142 mmol/L (ref 134–144)
Total Protein: 6.8 g/dL (ref 6.0–8.5)
eGFR: 89 mL/min/{1.73_m2} (ref >59)

## 2021-02-19 LAB — LIPID PANEL WITH LDL/HDL RATIO
Cholesterol, Total: 219 mg/dL — ABNORMAL HIGH (ref 100–199)
HDL: 75 mg/dL (ref >39)
LDL Chol Calc (NIH): 126 mg/dL — ABNORMAL HIGH (ref 0–99)
LDL/HDL Ratio: 1.7 ratio (ref 0.0–3.2)
Triglycerides: 106 mg/dL (ref 0–149)
VLDL Cholesterol Cal: 18 mg/dL (ref 5–40)

## 2021-02-19 LAB — CBC WITH DIFFERENTIAL/PLATELET
Basophils Absolute: 0.1 10*3/uL (ref 0.0–0.2)
Basos: 1 %
EOS (ABSOLUTE): 0.3 10*3/uL (ref 0.0–0.4)
Eos: 5 %
Hematocrit: 46.6 % (ref 34.0–46.6)
Hemoglobin: 15.3 g/dL (ref 11.1–15.9)
Immature Grans (Abs): 0 10*3/uL (ref 0.0–0.1)
Immature Granulocytes: 1 %
Lymphocytes Absolute: 1.8 10*3/uL (ref 0.7–3.1)
Lymphs: 31 %
MCH: 29.4 pg (ref 26.6–33.0)
MCHC: 32.8 g/dL (ref 31.5–35.7)
MCV: 90 fL (ref 79–97)
Monocytes Absolute: 0.6 10*3/uL (ref 0.1–0.9)
Monocytes: 10 %
Neutrophils Absolute: 3.2 10*3/uL (ref 1.4–7.0)
Neutrophils: 52 %
Platelets: 234 10*3/uL (ref 150–450)
RBC: 5.2 x10E6/uL (ref 3.77–5.28)
RDW: 12.4 % (ref 11.7–15.4)
WBC: 5.9 10*3/uL (ref 3.4–10.8)

## 2021-02-19 LAB — FOLATE: Folate: 13.2 ng/mL (ref >3.0)

## 2021-02-19 LAB — VITAMIN B12: Vitamin B-12: 866 pg/mL (ref 232–1245)

## 2021-02-19 LAB — HEMOGLOBIN A1C
Est. average glucose Bld gHb Est-mCnc: 103 mg/dL
Hgb A1c MFr Bld: 5.2 % (ref 4.8–5.6)

## 2021-02-19 LAB — INSULIN, RANDOM: INSULIN: 11.2 u[IU]/mL (ref 2.6–24.9)

## 2021-02-19 LAB — VITAMIN D 25 HYDROXY (VIT D DEFICIENCY, FRACTURES): Vit D, 25-Hydroxy: 47.7 ng/mL (ref 30.0–100.0)

## 2021-02-19 MED ORDER — SPIRONOLACTONE 25 MG PO TABS: 25.0000 mg | ORAL_TABLET | Freq: Every day | ORAL | 3 refills | Status: DC

## 2021-02-19 NOTE — Patient Instructions (Signed)
Go forward with the [facility]  Drink lots of water  Avoid excess sodium   Take spironolactone 25 mg daily and if any side effects or problems let us know   Schedule non fasting labs in 2 weeks

## 2021-02-19 NOTE — Progress Notes (Signed)
Subjective:    Patient ID: Olivia Hamilton, female    DOB: 08-04-1966, 55 y.o.   MRN: [id number]  This visit occurred during the SARS-CoV-2 public health emergency.  Safety protocols were in place, including screening questions prior to the visit, additional usage of staff PPE, and extensive cleaning of exam room while observing appropriate contact time as indicated for disinfecting solutions.   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 and was px aldactone 25 mg daily (Tina the PA) in October  Wanted to work on wt loss   [facility]   Walks dog 30 min per day  Will be adding more    Mother had HTN   Normally she eats healthy but is also a stress eater  Mother died and in grief she ate a lot of sweets (she is ready to change that)   In counseling for grief    BP Readings from Last 3 Encounters:  02/19/21 (!) 146/82  02/18/21 135/86  12/10/20 (!) 128/98   Pulse Readings from Last 3 Encounters:  02/19/21 78  02/18/21 66  12/10/20 80   She has varicose veins in her legs and also tends to retain fluids  The aldactone helps that   No h/o heart problems     She had labs yesterday   Lab Results  Component Value Date   CREATININE 0.78 02/18/2021   BUN 15 02/18/2021   NA 142 02/18/2021   K 4.4 02/18/2021   CL 102 02/18/2021   CO2 24 02/18/2021   Lab Results  Component Value Date   ALT 12 02/18/2021   AST 19 02/18/2021   GGT 13 01/28/2017   ALKPHOS 118 02/18/2021   BILITOT 0.5 02/18/2021   Glucose 86  Lab Results  Component Value Date   CHOL 219 (H) 02/18/2021   HDL 75 02/18/2021   LDLCALC 126 (H) 02/18/2021   TRIG 106 02/18/2021   CHOLHDL 3.0 01/28/2017   Vit D 47.7 Lab  Results  Component Value Date   VITAMINB12 866 02/18/2021  Globulin 2.1 Lab Results  Component Value Date   WBC 5.9 02/18/2021   HGB 15.3 02/18/2021   HCT 46.6 02/18/2021   MCV 90 02/18/2021   PLT 234 02/18/2021   Lab Results  Component Value Date   HGBA1C 5.2 02/18/2021   Insulin level 11.2  Folate 13.2   Patient Active Problem List   Diagnosis Date Noted   Class 2 obesity due to excess calories with body mass index (BMI) of 37.0 to 37.9 in adult 02/19/2021   Depression screening 02/18/2021   Benign essential HTN 02/18/2021   Hypothyroidism (acquired) 08/05/2020   Past Medical History:  Diagnosis Date   ADD (attention deficit disorder)    Depression    Dry mouth    Gallbladder disease    Hashimoto's disease    High blood pressure    PCOS (polycystic ovarian syndrome)    Swelling    Thyroid disease    Varicose veins    Past Surgical History:  Procedure Laterality Date   OVARIAN CYST REMOVAL     Social History   Tobacco Use  Smoking status: Never   Smokeless tobacco: Never  Substance Use Topics   Alcohol use: Yes    Alcohol/week: 2.0 standard drinks    Types: 1 Glasses of wine, 1 Cans of beer per week   Drug use: Never   Family History  Problem Relation Age of Onset   Obesity Mother    Thyroid disease Mother    Other Mother        VARICOSE VEINS   Arthritis Mother    Hypertension Mother    Obesity Father    Cancer Father    Hypertension Father    Lung cancer Father    Allergies  Allergen Reactions   Dairycare [Lactase-Lactobacillus]    Gluten Meal Swelling   Sulfonamide Derivatives     REACTION: eye swelling   Current Outpatient Medications on File Prior to Visit  Medication Sig Dispense Refill   aspirin 81 MG tablet Take 81 mg by mouth daily.      Ca Phosphate-Cholecalciferol (CALCIUM/VITAMIN D3 GUMMIES) 250-350 MG-UNIT CHEW Chew 250 mg by mouth. Two daily     estradiol (ESTRACE) 0.1 MG/GM vaginal cream Place 1 Applicatorful vaginally at  bedtime. Compound Rx     levothyroxine (SYNTHROID, LEVOTHROID) 88 MCG tablet Take 88 mcg by mouth daily before breakfast.     liothyronine (CYTOMEL) 50 MCG tablet Take 1 tablet by mouth daily.     loratadine (CLARITIN) 10 MG tablet Take 10 mg by mouth daily.     NON FORMULARY compounded medication  ESTRIOL     progesterone (PROMETRIUM) 100 MG capsule Take 1 capsule by mouth daily. For 12 days     sodium chloride (OCEAN) 0.65 % SOLN nasal spray Place 2 sprays into both nostrils every 2 (two) hours while awake.  0   Vitamins A & D (VITAMIN A & D) [address] units TABS Take 25,000 Units by mouth. 3 x weekly     fluticasone (FLONASE) 50 MCG/ACT nasal spray Place 1 spray into both nostrils 2 (two) times daily as needed for allergies or rhinitis. 48 mL 3   No current facility-administered medications on file prior to visit.     Review of Systems  Constitutional:  Negative for activity change, appetite change, fatigue, fever and unexpected weight change.  HENT:  Negative for congestion, ear pain, rhinorrhea, sinus pressure and sore throat.   Eyes:  Negative for pain, redness and visual disturbance.  Respiratory:  Negative for cough, shortness of breath and wheezing.   Cardiovascular:  Negative for chest pain and palpitations.  Gastrointestinal:  Negative for abdominal pain, blood in stool, constipation and diarrhea.  Endocrine: Negative for polydipsia and polyuria.  Genitourinary:  Positive for urgency. Negative for dysuria and frequency.  Musculoskeletal:  Negative for arthralgias, back pain and myalgias.  Skin:  Negative for pallor and rash.  Allergic/Immunologic: Negative for environmental allergies.  Neurological:  Negative for dizziness, syncope and headaches.  Hematological:  Negative for adenopathy. Does not bruise/bleed easily.  Psychiatric/Behavioral:  Negative for decreased concentration and dysphoric mood. The patient is not nervous/anxious.        Grief rxn from loss of her mother   In counseling Doing better      Objective:   Physical Exam Constitutional:      General: She is not in acute distress.    Appearance: Normal appearance. She is well-developed. She is obese. She is not ill-appearing or diaphoretic.  HENT:     Head: Normocephalic and atraumatic.  Eyes:     Conjunctiva/sclera: Conjunctivae  normal.     Pupils: Pupils are equal, round, and reactive to light.  Neck:     Thyroid: No thyromegaly.     Vascular: No carotid bruit or JVD.  Cardiovascular:     Rate and Rhythm: Normal rate and regular rhythm.     Heart sounds: Normal heart sounds.    No gallop.  Pulmonary:     Effort: Pulmonary effort is normal. No respiratory distress.     Breath sounds: Normal breath sounds. No wheezing or rales.  Abdominal:     General: Bowel sounds are normal. There is no distension or abdominal bruit.     Palpations: Abdomen is soft. There is no mass.     Tenderness: There is no abdominal tenderness.  Musculoskeletal:     Cervical back: Normal range of motion and neck supple.     Right lower leg: No edema.     Left lower leg: No edema.  Lymphadenopathy:     Cervical: No cervical adenopathy.  Skin:    General: Skin is warm and dry.     Coloration: Skin is not pale.     Findings: No rash.  Neurological:     Mental Status: She is alert.     Coordination: Coordination normal.     Deep Tendon Reflexes: Reflexes are normal and symmetric. Reflexes normal.  Psychiatric:        Mood and Affect: Mood normal.        Cognition and Memory: Cognition and memory normal.          Assessment & Plan:   Problem List Items Addressed This Visit       Cardiovascular and Mediastinum   Benign essential HTN - Primary    Pt is new to me today with h/o obesity and hypothyroidism  fam hx of HTN noted  This is a new diagnosis here but took aldactone in the past with another clinic Starting to work on wt loss with [facility]  BP: (!) 146/82 will  restart spironolacton 25 mg daily (well tol in the past)  Lab in 2 wk for bmet  Disc HTN and implications with goal to prevent end organ damage Pt will check bp and work and update as well        Relevant Medications   spironolactone (ALDACTONE) 25 MG tablet     Other   Class 2 obesity due to excess calories with body mass index (BMI) of 37.0 to 37.9 in adult    Pt new to me with bmi of 37.3 and ready to start process of wt loss  HTN as well  Had first visit with [facility] and likes it  Labs reviewed today   Discussed how this problem influences overall health and the risks it imposes  Reviewed plan for weight loss with lower calorie diet (via better food choices and also portion control or program like weight watchers) and exercise building up to or more than 30 minutes 5 days per week including some aerobic activity

## 2021-02-19 NOTE — Assessment & Plan Note (Signed)
Pt new to me with bmi of 37.3 and ready to start process of wt loss  HTN as well  Had [facility] visit with [facility] and likes it  Labs reviewed today   Discussed how this problem influences overall health and the risks it imposes  Reviewed plan for weight loss with lower calorie diet (via better food choices and also portion control or program like weight watchers) and exercise building up to or more than 30 minutes 5 days per week including some aerobic activity

## 2021-02-19 NOTE — Assessment & Plan Note (Signed)
Pt is new to me today with h/o obesity and hypothyroidism  fam hx of HTN noted  This is a new diagnosis here but took aldactone in the past with another clinic Starting to [facility] on wt [facility]  BP: (!) 146/82 will restart spironolacton 25 mg daily (well tol in the past)  Lab in 2 wk for bmet  Disc HTN and implications with goal to prevent end organ damage Pt will check bp and work and update as well

## 2021-02-25 NOTE — Progress Notes (Signed)
Dear Albina Billet, NP,   Thank you for referring MADDYSON KEIL to our clinic. The following note includes my evaluation and treatment recommendations.  Chief Complaint:   OBESITY KADE RICKELS (MR# [id number]) is a 55 y.o. female who presents for evaluation and treatment of obesity and related comorbidities. Current BMI is Body mass index is 36.78 kg/m. Coryn has been struggling with her weight for many years and has been unsuccessful in either losing weight, maintaining weight loss, or reaching her healthy weight goal.  Rotunda is currently in the action stage of change and ready to dedicate time achieving and maintaining a healthier weight. Saman is interested in becoming our patient and working on intensive lifestyle modifications including (but not limited to) diet and exercise for weight loss.  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  She lives alone in her home.  Weight watchers and low carb worked well for her in the past.  Craves sweets and snacks.  She says she "likes everything".  Not a picky eater.  Worst habit is eating before bed.  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  She has used MFP in the past and did well with it.  Peightyn's habits were reviewed today and are as follows: her desired weight loss is 41 pounds, she has been heavy most of her life, she started gaining weight after high school, her heaviest weight ever was 221 pounds, she craves sweets, she snacks frequently in the evenings, she frequently makes poor food choices, she frequently eats larger portions than normal, and she struggles with emotional eating.  Depression Screen Philomina's Food and Mood (modified PHQ-9) score was 18.  Depression screen PHQ 2/9 02/18/2021  Decreased Interest 3  Down, Depressed, Hopeless 3  PHQ - 2 Score 6  Altered sleeping 1  Tired, decreased energy 3  Change in appetite 2  Feeling bad or failure about yourself  3  Trouble  concentrating 1  Moving slowly or fidgety/restless 2  Suicidal thoughts 0  PHQ-9 Score 18  Difficult doing work/chores Very difficult     Assessment/Plan:   Orders Placed This Encounter  Procedures   Vitamin B12   CBC with Differential/Platelet   Comprehensive metabolic panel   Lipid Panel With LDL/HDL Ratio   VITAMIN D 25 Hydroxy (Vit-D Deficiency, Fractures)   Folate   Hemoglobin A1c   Insulin, random   EKG 12-Lead   Medications Discontinued During This Encounter  Medication Reason   albuterol (VENTOLIN HFA) 108 (90 Base) MCG/ACT inhaler Error    1. Other fatigue Keah reports daytime somnolence and reports waking up still tired. Patent has a history of symptoms of daytime fatigue, morning fatigue, and morning headache. Franchon generally gets 6 or 7 hours of sleep per night, and states that she stays up late. Snoring is not present. Apneic episodes are not present. Epworth Sleepiness Score is 10.  She is tired when she wakes.  Denies issues with sleep, but since the loss of her mother, does not like to go to bed/turn off TV.  Denies concerns of OSA.  Nyah does feel that her weight is causing her energy to be lower than it should be. Fatigue may be related to obesity, depression or many other causes. Labs will be ordered, and in the meanwhile, Sebastian will focus on self care including making healthy food choices, increasing physical activity and focusing on stress reduction.  Will check EKG and labs today.  -  EKG 12-Lead - Vitamin B12 - CBC with Differential/Platelet - Comprehensive metabolic panel - Lipid Panel With LDL/HDL Ratio - Folate - Hemoglobin A1c - Insulin, random  2. Shortness of breath on exertion Johnica notes increasing shortness of breath with exercising and seems to be worsening over time with weight gain. She notes getting out of breath sooner with activity than she used to. This has gotten worse recently. Ananya denies shortness of breath at rest or  orthopnea.  Kniyah does feel that she gets out of breath more easily that she used to when she exercises. Shekita's shortness of breath appears to be obesity related and exercise induced. She has agreed to work on weight loss and gradually increase exercise to treat her exercise induced shortness of breath. Will continue to monitor closely.  Will check IC today.  3. Hashimoto's disease Her PCP, [facility] treats her for this with T3 compound and Synthroid 88 mcg daily.  She is asymptomatic.  No current concerns.  Plan:  Treatment plan per [facility].  4. Blood pressure elevated without history of HTN Occasionally takes spironolactone under the direction of her PCP, Inetta Fermo at work, and Ms. Baity.  Plan:  Seeing PCP in the near future for follow-up of blood pressure.  Blood pressure too high today.  Advised prudent nutritional plan and weight los.  Medication management per PCP, as seen fit.  Will check labs today.  BP Readings from Last 3 Encounters:  02/19/21 (!) 146/82  02/18/21 135/86  12/10/20 (!) 128/98   Lab Results  Component Value Date   CREATININE 0.78 02/18/2021   - CBC with Differential/Platelet - Comprehensive metabolic panel - Lipid Panel With LDL/HDL Ratio  5. PCOS (polycystic ovarian syndrome) This is treated by [facility].  They have her on compounded hormones.  No current concerns.  Plan:  Treatment plan per [facility].  Check labs.  She will continue to focus on protein-rich, low simple carbohydrate foods. We reviewed the importance of hydration, regular exercise for stress reduction, and restorative sleep.   Counseling PCOS is a leading cause of menstrual irregularities and infertility. It is also associated with obesity, hirsutism (excessive hair growth on the face, chest, or back), and cardiovascular risk factors such as high cholesterol and insulin resistance. Insulin resistance appears to play a central role.  Women with PCOS  have been shown to have impaired appetite-regulating hormones. Metformin is one medication that can improve metabolic parameters.  Women with polycystic ovary syndrome (PCOS) have an increased risk for cardiovascular disease (CVD) - European Journal of Preventive Cardiology.  - Hemoglobin A1c - Insulin, random  6. Vitamin D deficiency Not optimized. Current vitamin D is 47.7, tested on 02/18/2021. Optimal goal > 50 ng/dL.  She takes 25,000 IU 3 days per week when she remembers.  Plan: Continue current OTC vitamin D supplementation.  Will check vitamin D level today, as per below.  - VITAMIN D 25 Hydroxy (Vit-D Deficiency, Fractures)  7. Other depression with emotional eating History of ADHD.  Was on medication in her 75s.  She was on Wellbutrin and Cymbalta.  Denies need for medication now and goes to therapy once weekly since her mother's passing in February 2022.  PHQ 18.  Plan: Patient was referred to Dr. Dewaine Conger, our Bariatric Psychologist, for evaluation due to her elevated PHQ-9 score and significant struggles with emotional eating.  Continue with grief counselor as well.  8. At risk for hypertension Due to Giavanni's current state of health, lifestyle habits and  medical condition(s), she is at a higher risk for developing hypertension.    At least 23 minutes was spent on counseling Haasini about these concerns today.  I stressed the importance of reversing risks factors for hypertension which include lowering her BMI.  I discussed there are genetic and family factors which cannot be changed, and I encouraged Terrace Arabiaonya C Limones to focus on lifestyle choices that can be modified such as engaging in a healthy diet which includes lowering salt intake, increasing activity, limiting alcohol and/or tobacco intake.  Counseling: Intensive lifestyle modifications discussed with Nathalia as most appropriate first line treatment.  she will continue to work on diet, exercise and weight loss efforts.  We will  continue to reassess these conditions on a fairly regular basis in an attempt to decrease patient's overall morbidity and mortality.   9. Class 2 severe obesity with serious comorbidity and body mass index (BMI) of 36.0 to 36.9 in adult, unspecified obesity type (HCC)  Archie Pattenonya is currently in the action stage of change and her goal is to continue with weight loss efforts. I recommend Archie Pattenonya begin the structured treatment plan as follows:  She has agreed to keeping a food journal and adhering to recommended goals of 1300-1400 calories and 80+ grams of protein daily.  Exercise goals:  As is.    Behavioral modification strategies: increasing lean protein intake, decreasing simple carbohydrates, meal planning and cooking strategies, avoiding temptations, and planning for success.  She was informed of the importance of frequent follow-up visits to maximize her success with intensive lifestyle modifications for her multiple health conditions. She was informed we would discuss her lab results at her next visit unless there is a critical issue that needs to be addressed sooner. Verlinda agreed to keep her next visit at the agreed upon time to discuss these results.   Objective:   Blood pressure 135/86, pulse 66, temperature 97.7 F (36.5 C), height 5\' 5"  (1.651 m), weight 221 lb (100.2 kg), SpO2 99 %. Body mass index is 36.78 kg/m.  EKG: Normal sinus rhythm, rate 68 bpm.  Indirect Calorimeter completed today shows a VO2 of 280 and a REE of 1947.  Her calculated basal metabolic rate is [id number] thus her basal metabolic rate is better than expected.  General: Cooperative, alert, well developed, in no acute distress. HEENT: Conjunctivae and lids unremarkable. Cardiovascular: Regular rhythm.  Lungs: Normal work of breathing. Neurologic: No focal deficits.   Lab Results  Component Value Date   CREATININE 0.78 02/18/2021   BUN 15 02/18/2021   NA 142 02/18/2021   K 4.4 02/18/2021   CL 102 02/18/2021    CO2 24 02/18/2021   Lab Results  Component Value Date   ALT 12 02/18/2021   AST 19 02/18/2021   GGT 13 01/28/2017   ALKPHOS 118 02/18/2021   BILITOT 0.5 02/18/2021   Lab Results  Component Value Date   HGBA1C 5.2 02/18/2021   HGBA1C 5.1 01/28/2017   Lab Results  Component Value Date   INSULIN 11.2 02/18/2021   Lab Results  Component Value Date   TSH <0.005 (L) 07/05/2020   Lab Results  Component Value Date   CHOL 219 (H) 02/18/2021   HDL 75 02/18/2021   LDLCALC 126 (H) 02/18/2021   TRIG 106 02/18/2021   CHOLHDL 3.0 01/28/2017   Lab Results  Component Value Date   WBC 5.9 02/18/2021   HGB 15.3 02/18/2021   HCT 46.6 02/18/2021   MCV 90 02/18/2021   PLT 234  02/18/2021   Lab Results  Component Value Date   IRON 81 01/28/2017    Attestation Statements:   This is the patient's first visit at [facility]. The patient's NEW PATIENT PACKET was reviewed at length. Included in the packet: current and past health history, medications, allergies, ROS, gynecologic history (women only), surgical history, family history, social history, weight history, weight loss surgery history (for those that have had weight loss surgery), nutritional evaluation, mood and food questionnaire, PHQ9, Epworth questionnaire, sleep habits questionnaire, patient life and health improvement goals questionnaire. These will all be scanned into the patient's chart under media.   During the visit, I independently reviewed the patient's EKG, bioimpedance scale results, and indirect calorimeter results. I used this information to tailor a meal plan for the patient that will help her to lose weight and will improve her obesity-related conditions going forward. I performed a medically necessary appropriate examination and/or evaluation. I discussed the assessment and treatment plan with the patient. The patient was provided an opportunity to ask questions and all were answered. The patient agreed  with the plan and demonstrated an understanding of the instructions. Labs were ordered at this visit and will be reviewed at the next visit unless more critical results need to be addressed immediately. Clinical information was updated and documented in the EMR.   I, Insurance claims handler, CMA, am acting as Energy manager for [employer], DO.  I have reviewed the above documentation for accuracy and completeness, and I agree with the above. Carlye Grippe, D.O.  The 21st Century Cures Act was signed into law in 2016 which includes the topic of electronic health records.  This provides immediate access to information in MyChart.  This includes consultation notes, operative notes, office notes, lab results and pathology reports.  If you have any questions about what you read please let us know at your next visit so we can discuss your concerns and take corrective action if need be.  We are right here with you.

## 2021-03-03 ENCOUNTER — Telehealth (INDEPENDENT_AMBULATORY_CARE_PROVIDER_SITE_OTHER): Payer: No Typology Code available for payment source | Admitting: Psychology

## 2021-03-03 DIAGNOSIS — F5089 Other specified eating disorder: Secondary | ICD-10-CM

## 2021-03-03 DIAGNOSIS — F4323 Adjustment disorder with mixed anxiety and depressed mood: Secondary | ICD-10-CM | POA: Diagnosis not present

## 2021-03-04 ENCOUNTER — Other Ambulatory Visit: Payer: Self-pay

## 2021-03-04 ENCOUNTER — Encounter (INDEPENDENT_AMBULATORY_CARE_PROVIDER_SITE_OTHER): Payer: Self-pay | Admitting: Family Medicine

## 2021-03-04 ENCOUNTER — Ambulatory Visit (INDEPENDENT_AMBULATORY_CARE_PROVIDER_SITE_OTHER): Payer: No Typology Code available for payment source | Admitting: Family Medicine

## 2021-03-04 VITALS — BP 126/82 | HR 74 | Temp 97.9°F | Ht 65.0 in | Wt 219.0 lb

## 2021-03-04 DIAGNOSIS — I10 Essential (primary) hypertension: Secondary | ICD-10-CM

## 2021-03-04 DIAGNOSIS — E7849 Other hyperlipidemia: Secondary | ICD-10-CM | POA: Diagnosis not present

## 2021-03-04 DIAGNOSIS — Z6836 Body mass index (BMI) 36.0-36.9, adult: Secondary | ICD-10-CM

## 2021-03-04 DIAGNOSIS — E8881 Metabolic syndrome: Secondary | ICD-10-CM

## 2021-03-04 DIAGNOSIS — E039 Hypothyroidism, unspecified: Secondary | ICD-10-CM

## 2021-03-04 DIAGNOSIS — E559 Vitamin D deficiency, unspecified: Secondary | ICD-10-CM | POA: Diagnosis not present

## 2021-03-04 DIAGNOSIS — F3289 Other specified depressive episodes: Secondary | ICD-10-CM

## 2021-03-04 DIAGNOSIS — Z9189 Other specified personal risk factors, not elsewhere classified: Secondary | ICD-10-CM | POA: Diagnosis not present

## 2021-03-04 MED ORDER — VITAMIN D (ERGOCALCIFEROL) 1.25 MG (50000 UNIT) PO CAPS: ORAL_CAPSULE | ORAL | 0 refills | Status: DC

## 2021-03-04 NOTE — Patient Instructions (Signed)
The 10-year ASCVD risk score Denman George DC Montez Hageman., et al., 2013) is: 2.4%   Values used to calculate the score:     Age: 55 years     Sex: Female     Is Non-Hispanic African American: No     Diabetic: No     Tobacco smoker: No     Systolic Blood Pressure: 126 mmHg     Is BP treated: Yes     HDL Cholesterol: 75 mg/dL     Total Cholesterol: 219 mg/dL

## 2021-03-05 ENCOUNTER — Other Ambulatory Visit: Payer: Self-pay

## 2021-03-05 ENCOUNTER — Other Ambulatory Visit (INDEPENDENT_AMBULATORY_CARE_PROVIDER_SITE_OTHER): Payer: No Typology Code available for payment source

## 2021-03-05 DIAGNOSIS — I10 Essential (primary) hypertension: Secondary | ICD-10-CM | POA: Diagnosis not present

## 2021-03-05 LAB — BASIC METABOLIC PANEL
BUN: 11 mg/dL (ref 6–23)
CO2: 31 mEq/L (ref 19–32)
Calcium: 9.7 mg/dL (ref 8.4–10.5)
Chloride: 102 mEq/L (ref 96–112)
Creatinine, Ser: 0.95 mg/dL (ref 0.40–1.20)
GFR: 66.92 mL/min (ref >60.00)
Glucose, Bld: 109 mg/dL — ABNORMAL HIGH (ref 70–99)
Potassium: 4.2 mEq/L (ref 3.5–5.1)
Sodium: 139 mEq/L (ref 135–145)

## 2021-03-11 NOTE — Progress Notes (Signed)
  Office: [phone]  /  Fax: [phone]    Date: March 24, 2021   Appointment Start Time: 2:30pm Duration: 26 minutes Provider: Lawerance Cruel, Psy.D. Type of Session: Individual Therapy  Location of Patient: Home Location of Provider: Provider's home (private office) Type of Contact: Telepsychological Visit via MyChart Video Visit  Session Content: Olivia Hamilton is a 55 y.o. female presenting for a follow-up appointment to address the previously established treatment goal of increasing coping skills. Today's appointment was a telepsychological visit due to COVID-19. Olivia Hamilton provided verbal consent for today's telepsychological appointment and she is aware she is responsible for securing confidentiality on her end of the session. Prior to proceeding with today's appointment, Olivia Hamilton's physical location at the time of this appointment was obtained as well a phone number she could be reached at in the event of technical difficulties. Olivia Hamilton and this provider participated in today's telepsychological service.   This provider conducted a brief check-in. Olivia Hamilton shared she recently went to the beach, noting, "It was nice and relaxing." She discussed challenges with resuming eating congruent to her structured meal plan. Olivia Hamilton shared today is the first day she has worked toward "getting back on track." This was further explored and processed. Session also focused on self-compassion. She discussed trying to focus on moving forward. As such, Olivia Hamilton was engaged in problem solving and strategies were discussed. Olivia Hamilton was receptive to engaging in some meal prep (e.g., roasting vegetables for multiple meals; making hard boiled eggs for a couple days; freezing meals; and having a working plan for meals). Reviewed emotional and physical hunger. Olivia Hamilton was receptive to today's appointment as evidenced by openness to sharing, responsiveness to feedback, and willingness to implement discussed strategies.  Mental Status  Examination:  Appearance: well groomed and appropriate hygiene  Behavior: appropriate to circumstances Mood: sad Affect: mood congruent Speech: normal in rate, volume, and tone Eye Contact: appropriate Psychomotor Activity: unable to assess Gait: unable to assess Thought Process: linear, logical, and goal directed  Thought Content/Perception: no hallucinations, delusions, bizarre thinking or behavior reported or observed and no evidence or endorsement of suicidal and homicidal ideation, plan, and intent Orientation: time, person, place, and purpose of appointment Memory/Concentration: memory, attention, language, and fund of knowledge intact  Insight/Judgment: fair  Interventions:  Conducted a brief chart review Provided empathic reflections and validation Engaged patient in problem solving Employed supportive psychotherapy interventions to facilitate reduced distress, and to improve coping skills with identified stressors  DSM-5 Diagnosis(es):  F50.89 Other Specified Feeding or Eating Disorder, Emotional Eating Behaviors and 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 Progress is limited, as Olivia Hamilton has just begun treatment with this provider; however, she is receptive to the interaction and interventions and rapport is being established.   Plan: The next appointment will be scheduled in two weeks, which will be via MyChart Video Visit. The next session will focus on working towards the established treatment goal.

## 2021-03-13 NOTE — Progress Notes (Signed)
Chief Complaint:   OBESITY [name] is here to discuss her progress with her obesity treatment plan along with follow-up of her obesity related diagnoses.   Today's visit was #: 2 Starting weight: 221 lbs Starting date: 02/18/2021 Today's weight: 219 lbs Today's date: 03/04/2021 Weight change since last visit: 2 lbs Total lbs lost to date: 2 lbs Body mass index is 36.44 kg/m.  Total weight loss percentage to date: -0.90%  Interim History:  Olivia Hamilton is here today for her first follow-up office visit since starting the program with [place].  All blood work/ lab tests that were recently ordered by myself or an outside provider were reviewed with patient today per their request.   Extended time was spent counseling her on all new disease processes that were discovered or preexisting ones that are worsening.  she understands that many of these abnormalities will need to monitored regularly along with the current treatment plan of prudent dietary changes, in which we are making each and every office visit, to improve these health parameters.  Olivia Hamilton did not track for 50% of the time due to time constraints, etc.  No issues with plan and wants to track.  She says it is just difficult to create new habits.  She is going to the beach next week for 1 week.  Will be ale to make meals as well.  Wants to discuss strategies for success.  Current Meal Plan: keeping a food journal and adhering to recommended goals of 1300-1400 calories and 80 grams of protein for 60% of the time.  Current Exercise Plan: Walking for 30 minutes 1 time per week.  Assessment/Plan:   Meds ordered this encounter  Medications   Vitamin D, Ergocalciferol, (DRISDOL) 1.25 MG (50000 UNIT) CAPS capsule    Sig: 1 po q wed and 1 po q sun    Dispense:  8 capsule    Refill:  0    1. Essential hypertension At goal. Medications: Aldactone 25 mg daily.  Started on medication recently.  Tolerating well.  Not checking at  home.  Plan:  Discussed labs with patient today.  Avoid buying foods that are: processed, frozen, or prepackaged to avoid excess salt. We will watch for signs of hypotension as she continues lifestyle modifications. We will continue to monitor closely alongside her PCP and/or Specialist.  Regular follow up with PCP and specialists was also encouraged.  Much improved blood pressure today.  BP Readings from Last 3 Encounters:  03/04/21 126/82  02/19/21 (!) 146/82  02/18/21 135/86   Lab Results  Component Value Date   CREATININE 0.95 03/05/2021   2. Other hyperlipidemia Course: Not at goal. Lipid-lowering medications: None. Elevated LDL 126, elevated HDL.  Plan:  Discussed labs with patient today.  Dietary changes: Increase soluble fiber, decrease simple carbohydrates, decrease saturated fat. Exercise changes: Moderate to vigorous-intensity aerobic activity 150 minutes per week or as tolerated. We will continue to monitor along with PCP/specialists as it pertains to her weight loss journey.  No need for medications.  Lab Results  Component Value Date   CHOL 219 (H) 02/18/2021   HDL 75 02/18/2021   LDLCALC 126 (H) 02/18/2021   TRIG 106 02/18/2021   CHOLHDL 3.0 01/28/2017   Lab Results  Component Value Date   ALT 12 02/18/2021   AST 19 02/18/2021   GGT 13 01/28/2017   ALKPHOS 118 02/18/2021   BILITOT 0.5 02/18/2021   The 10-year ASCVD risk score (Goff DC Jr.,  et al., 2013) is: 2.4%   Values used to calculate the score:     Age: 55 years     Sex: Female     Is Non-Hispanic African American: No     Diabetic: No     Tobacco smoker: No     Systolic Blood Pressure: 126 mmHg     Is BP treated: Yes     HDL Cholesterol: 75 mg/dL     Total Cholesterol: 219 mg/dL  3. Insulin resistance Not at goal. Goal is HgbA1c < 5.7, fasting insulin closer to 5.  Medication: None.    Plan:  New.  Discussed labs with patient today.  She will continue to focus on protein-rich, low simple  carbohydrate foods. We reviewed the importance of hydration, regular exercise for stress reduction, and restorative sleep.  Counseling done, handouts given.  Follow prudent nutritional plan, decrease simple carbs.  Repeat labs in 3 months or so.  Lab Results  Component Value Date   HGBA1C 5.2 02/18/2021   Lab Results  Component Value Date   INSULIN 11.2 02/18/2021   4. Hypothyroidism, unspecified type Medication: levothyroxine 88 mcg daily.   Plan:  Discussed labs with patient today.  Patient was instructed not to take MVM or iron within 4 hours of taking thyroid medications.  We will continue to monitor alongside Endocrinology/PCP as it relates to her weight loss journey.   Lab Results  Component Value Date   TSH <0.005 (L) 07/05/2020   5. Vitamin D deficiency Improving, but not optimized.  Currently on 75,000 IU weekly.  Not at goal.    Plan: - Discussed importance of vitamin D to their health and well-being.  - possible symptoms of low Vitamin D can be low energy, depressed mood, muscle aches, joint aches, osteoporosis etc. - low Vitamin D levels may be linked to an increased risk of cardiovascular events and even increased risk of cancers- such as colon and breast.  - I recommend pt take a twice weekly prescription vit D - see script below   - Informed patient this may be a lifelong thing, and she was encouraged to continue to take the medicine until told otherwise.   - we will need to monitor levels regularly (every 3-4 mo on average) to keep levels within normal limits.  - weight loss will likely improve availability of vitamin D, thus encouraged Olivia Hamilton to continue with meal plan and their weight loss efforts to further improve this condition - pt's questions and concerns regarding this condition addressed.  Lab Results  Component Value Date   VD25OH 47.7 02/18/2021   - Start Vitamin D, Ergocalciferol, (DRISDOL) 1.25 MG (50000 UNIT) CAPS capsule; 1 po q wed and 1 po q sun   Dispense: 8 capsule; Refill: 0  6. Other depression with emotional eating Not at goal. Medication: None.  Appointment with Dr. Dewaine Conger went well.  Has follow-up.  Mood stable.  Plan:  Discussed labs with patient today.  Handouts on mindful eating and emotional eating given.  Continue with Dr. Dewaine Conger.  7. At risk for diabetes mellitus - Olivia Hamilton was given diabetes prevention education and counseling today of more than 23 minutes.  - Counseled patient on pathophysiology of disease and meaning/ implication of lab results.  - Reviewed how certain foods can either stimulate or inhibit insulin release, and subsequently affect hunger pathways  - Importance of following a healthy meal plan with limiting amounts of simple carbohydrates discussed with patient - Effects of regular aerobic exercise  on blood sugar regulation reviewed and encouraged an eventual goal of 30 min 5d/week or more as a minimum.  - Briefly discussed treatment options, which always include dietary and lifestyle modification as first line.   - Handouts provided at patient's desire and/or told to go online to the American Diabetes Association website for further information.  8. Obesity with current BMI of 36.44  Course: Olivia Hamilton is currently in the action stage of change. As such, her goal is to continue with weight loss efforts.   Nutrition goals: She has agreed to keeping a food journal and adhering to recommended goals of 1300-1400 calories and 80 grams of protein.   Gave protein content sheet and eating out guide.  Exercise goals:  As is.  Behavioral modification strategies: increasing lean protein intake, decreasing simple carbohydrates, increasing water intake, travel eating strategies, and planning for success.  Olivia Hamilton has agreed to follow-up with our clinic in 2-3 weeks. She was informed of the importance of frequent follow-up visits to maximize her success with intensive lifestyle modifications for her multiple health  conditions.   Objective:   Blood pressure 126/82, pulse 74, temperature 97.9 F (36.6 C), height 5\' 5"  (1.651 m), weight 219 lb (99.3 kg), SpO2 98 %. Body mass index is 36.44 kg/m.  General: Cooperative, alert, well developed, in no acute distress. HEENT: Conjunctivae and lids unremarkable. Cardiovascular: Regular rhythm.  Lungs: Normal work of breathing. Neurologic: No focal deficits.   Lab Results  Component Value Date   CREATININE 0.95 03/05/2021   BUN 11 03/05/2021   NA 139 03/05/2021   K 4.2 03/05/2021   CL 102 03/05/2021   CO2 31 03/05/2021   Lab Results  Component Value Date   ALT 12 02/18/2021   AST 19 02/18/2021   GGT 13 01/28/2017   ALKPHOS 118 02/18/2021   BILITOT 0.5 02/18/2021   Lab Results  Component Value Date   HGBA1C 5.2 02/18/2021   HGBA1C 5.1 01/28/2017   Lab Results  Component Value Date   INSULIN 11.2 02/18/2021   Lab Results  Component Value Date   TSH <0.005 (L) 07/05/2020   Lab Results  Component Value Date   CHOL 219 (H) 02/18/2021   HDL 75 02/18/2021   LDLCALC 126 (H) 02/18/2021   TRIG 106 02/18/2021   CHOLHDL 3.0 01/28/2017   Lab Results  Component Value Date   VD25OH 47.7 02/18/2021   Lab Results  Component Value Date   WBC 5.9 02/18/2021   HGB 15.3 02/18/2021   HCT 46.6 02/18/2021   MCV 90 02/18/2021   PLT 234 02/18/2021   Lab Results  Component Value Date   IRON 81 01/28/2017   Attestation Statements:   Reviewed by clinician on day of visit: allergies, medications, problem list, medical history, surgical history, family history, social history, and previous encounter notes.  I, 01/30/2017, CMA, am acting as Insurance claims handler for Energy manager, DO.  I have reviewed the above documentation for accuracy and completeness, and I agree with the above. [employer], D.O.  The 21st Century Cures Act was signed into law in 2016 which includes the topic of electronic health records.  This provides immediate  access to information in MyChart.  This includes consultation notes, operative notes, office notes, lab results and pathology reports.  If you have any questions about what you read please let 2017 know at your next visit so we can discuss your concerns and take corrective action if need be.  We are  right here with you.

## 2021-03-24 ENCOUNTER — Telehealth (INDEPENDENT_AMBULATORY_CARE_PROVIDER_SITE_OTHER): Payer: No Typology Code available for payment source | Admitting: Psychology

## 2021-03-24 ENCOUNTER — Other Ambulatory Visit (INDEPENDENT_AMBULATORY_CARE_PROVIDER_SITE_OTHER): Payer: Self-pay | Admitting: Family Medicine

## 2021-03-24 DIAGNOSIS — F4323 Adjustment disorder with mixed anxiety and depressed mood: Secondary | ICD-10-CM | POA: Diagnosis not present

## 2021-03-24 DIAGNOSIS — E559 Vitamin D deficiency, unspecified: Secondary | ICD-10-CM

## 2021-03-24 DIAGNOSIS — F5089 Other specified eating disorder: Secondary | ICD-10-CM | POA: Diagnosis not present

## 2021-03-24 NOTE — Progress Notes (Signed)
  Office: [phone]  /  Fax: [phone]    Date: April 07, 2021   Appointment Start Time: 2:30pm Duration: 29 minutes Provider: Lawerance Cruel, Psy.D. Type of Session: Individual Therapy  Location of Patient: Home Location of Provider: Provider's home (private office) Type of Contact: Telepsychological Visit via MyChart Video Visit  Session Content: Olivia Hamilton is a 55 y.o. female presenting for a follow-up appointment to address the previously established treatment goal of increasing coping skills. Today's appointment was a telepsychological visit due to COVID-19. Olivia Hamilton provided verbal consent for today's telepsychological appointment and she is aware she is responsible for securing confidentiality on her end of the session. Prior to proceeding with today's appointment, Olivia Hamilton's physical location at the time of this appointment was obtained as well a phone number she could be reached at in the event of technical difficulties. Olivia Hamilton and this provider participated in today's telepsychological service.   This provider conducted a brief check-in. Olivia Hamilton shared about her recent appointment with Dr. Sharee Holster, noting she feels she is "not making progress." Associated thoughts and feelings were processed. She acknowledged a medication for cravings was discussed during her visit with Dr. Sharee Holster, adding she is considering taking it. She described feeling like a failure which further triggers emotional eating behaviors; therefore, she was engaged in thought challenging/reframing by exploring evidence for and against the thought "I'm a failure." This provider assisted Olivia Hamilton to develop a more balanced thought taking into account the evidence for and against the aforementioned thought. Olivia Hamilton was receptive to today's appointment as evidenced by openness to sharing, responsiveness to feedback, and  willingness to engage in thought challenging/reframing .  Mental Status Examination:  Appearance: well groomed and  appropriate hygiene  Behavior: appropriate to circumstances Mood: "frustrated"; sad Affect: mood congruent; tearful at times Speech: normal in rate, volume, and tone Eye Contact: appropriate Psychomotor Activity: unable to assess Gait: unable to assess Thought Process: linear, logical, and goal directed  Thought Content/Perception: no hallucinations, delusions, bizarre thinking or behavior reported or observed and no evidence or endorsement of suicidal and homicidal ideation, plan, and intent Orientation: time, person, place, and purpose of appointment Memory/Concentration: memory, attention, language, and fund of knowledge intact  Insight/Judgment: fair  Interventions:  Conducted a brief chart review Provided empathic reflections and validation Processed thoughts and feelings Engaged patient in thought challenging/cognitive reframing  Employed supportive psychotherapy interventions to facilitate reduced distress, and to improve coping skills with identified stressors  DSM-5 Diagnosis(es):  F50.89 Other Specified Feeding or Eating Disorder, Emotional Eating Behaviors and 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 Olivia Hamilton has demonstrated progress in her goal as evidenced by increased awareness of hunger patterns.   Plan: Due to Olivia Hamilton's work schedule, the next appointment will be scheduled in three weeks, which will be via MyChart Video Visit. The next session will focus on working towards the established treatment goal. Additionally, Olivia Hamilton stated she is seeing her grief counselor weekly, noting their next appointment is next week.

## 2021-03-24 NOTE — Telephone Encounter (Signed)
Pt last seen by Dr. Opalski.  

## 2021-03-24 NOTE — Telephone Encounter (Signed)
LAST APPOINTMENT DATE: 03/04/2021  NEXT APPOINTMENT DATE: 03/24/2021   [id number] - [place], [place] - [address] [place] [address] Phone: [phone] Fax: [phone]  Patient is requesting a refill of the following medications: Requested Prescriptions   Pending Prescriptions Disp Refills   Vitamin D, Ergocalciferol, (DRISDOL) 1.25 MG (50000 UNIT) CAPS capsule [Pharmacy Med Name: VITAMIN D2 1.25MG (50,000 UNIT)] 8 capsule 0    Sig: 1 EVERY WEDNESDAY AND 1 EVERY SUNDAY    Date last filled: 03/04/21 Previously prescribed by Opalski  Lab Results  Component Value Date   HGBA1C 5.2 02/18/2021   HGBA1C 5.1 01/28/2017   Lab Results  Component Value Date   LDLCALC 126 (H) 02/18/2021   CREATININE 0.95 03/05/2021   Lab Results  Component Value Date   VD25OH 47.7 02/18/2021    BP Readings from Last 3 Encounters:  03/04/21 126/82  02/19/21 (!) 146/82  02/18/21 135/86

## 2021-03-26 ENCOUNTER — Ambulatory Visit (INDEPENDENT_AMBULATORY_CARE_PROVIDER_SITE_OTHER): Payer: No Typology Code available for payment source | Admitting: Family Medicine

## 2021-03-31 ENCOUNTER — Encounter (INDEPENDENT_AMBULATORY_CARE_PROVIDER_SITE_OTHER): Payer: Self-pay | Admitting: Family Medicine

## 2021-03-31 ENCOUNTER — Other Ambulatory Visit: Payer: Self-pay

## 2021-03-31 ENCOUNTER — Ambulatory Visit (INDEPENDENT_AMBULATORY_CARE_PROVIDER_SITE_OTHER): Payer: No Typology Code available for payment source | Admitting: Family Medicine

## 2021-03-31 VITALS — BP 116/79 | HR 78 | Temp 97.8°F | Ht 65.0 in

## 2021-03-31 DIAGNOSIS — E8881 Metabolic syndrome: Secondary | ICD-10-CM | POA: Diagnosis not present

## 2021-03-31 DIAGNOSIS — E559 Vitamin D deficiency, unspecified: Secondary | ICD-10-CM | POA: Diagnosis not present

## 2021-03-31 DIAGNOSIS — F3289 Other specified depressive episodes: Secondary | ICD-10-CM | POA: Diagnosis not present

## 2021-03-31 DIAGNOSIS — Z6836 Body mass index (BMI) 36.0-36.9, adult: Secondary | ICD-10-CM

## 2021-03-31 DIAGNOSIS — I10 Essential (primary) hypertension: Secondary | ICD-10-CM | POA: Diagnosis not present

## 2021-03-31 DIAGNOSIS — Z9189 Other specified personal risk factors, not elsewhere classified: Secondary | ICD-10-CM

## 2021-03-31 MED ORDER — VITAMIN D (ERGOCALCIFEROL) 1.25 MG (50000 UNIT) PO CAPS
ORAL_CAPSULE | ORAL | 0 refills | Status: DC
Start: 2021-03-31 — End: 2021-06-10

## 2021-04-07 ENCOUNTER — Telehealth (INDEPENDENT_AMBULATORY_CARE_PROVIDER_SITE_OTHER): Payer: No Typology Code available for payment source | Admitting: Psychology

## 2021-04-07 DIAGNOSIS — F5089 Other specified eating disorder: Secondary | ICD-10-CM | POA: Diagnosis not present

## 2021-04-07 DIAGNOSIS — F4323 Adjustment disorder with mixed anxiety and depressed mood: Secondary | ICD-10-CM

## 2021-04-07 NOTE — Progress Notes (Signed)
Chief Complaint:   OBESITY Olivia Hamilton is here to discuss her progress with her obesity treatment plan along with follow-up of her obesity related diagnoses. Olivia Hamilton is on keeping a food journal and adhering to recommended goals of 1300-1400 calories and 80 grams of protein daily and states she is following her eating plan approximately 50% of the time. Olivia Hamilton states she is walking the dog for 30 minutes 3 times per week.  Today's visit was #: 3 Starting weight: 221 lbs Starting date: 02/18/2021 Today's weight: 219 lbs Today's date: 03/31/2021 Total lbs lost to date: 2 Total lbs lost since last in-office visit: 0  Interim History: Olivia Hamilton's last office visit was approximately 1 month ago, lost to follow up. She cant eat gluten or dairy. She has been journaling the past week with days over  on calories, and under on protein. Her water intake is 67.6 oz per day.  Subjective:   1. Essential hypertension Olivia Hamilton's blood pressure is controlled. Cardiovascular ROS:  no side effects or concerns .  2. Insulin resistance Olivia Hamilton was newly diagnosed at her last office visit. She is still snacking on sweets at night.  3. Vitamin D deficiency Olivia Hamilton started prescription Vit D at her last office visit, and she is tolerating it well. Her medication compliance is good.  4. Other depression with emotional eating Olivia Hamilton had a session with Dr. Dewaine Conger and also with her grief counselor. She notes it is going well and she found it useful. Her emotional eating is controlled currently per the patient.  5. At risk for dehydration Olivia Hamilton is at risk for dehydration due to inadequate water intake.  Assessment/Plan:  No orders of the defined types were placed in this encounter.   Medications Discontinued During This Encounter  Medication Reason   Vitamin D, Ergocalciferol, (DRISDOL) 1.25 MG (50000 UNIT) CAPS capsule Reorder     Meds ordered this encounter  Medications   Vitamin D, Ergocalciferol, (DRISDOL) 1.25  MG (50000 UNIT) CAPS capsule    Sig: 1 po q wed and 1 po q sun    Dispense:  8 capsule    Refill:  0    Ov for RF; 30 d supply     1. Essential hypertension Olivia Hamilton is at goal on spironolactone. She is to decrease salt, increase water, and continue prudent nutritional plan and weight loss to improve blood pressure control. We will watch for signs of hypotension as she continues her lifestyle modifications.  2. Insulin resistance Handout was given on metformin and we will reassess at her next office visit. If focusing on following the meal plan and meeting her goals doesn't improve her symptoms, then we will start metformin. Olivia Hamilton will continue to work on weight loss, exercise, and decreasing simple carbohydrates to help decrease the risk of diabetes. Olivia Hamilton agreed to follow-up with [place] as directed to closely monitor her progress.  3. Vitamin D deficiency Low Vitamin D level contributes to fatigue and are associated with obesity, breast, and colon cancer. We will refill prescription Vitamin D for 1 month. Olivia Hamilton will follow-up for routine testing of Vitamin D, at least 2-3 times per year to avoid over-replacement.  - Vitamin D, Ergocalciferol, (DRISDOL) 1.25 MG (50000 UNIT) CAPS capsule; 1 po q wed and 1 po q sun  Dispense: 8 capsule; Refill: 0  4. Other depression with emotional eating Behavior modification techniques were discussed today to help [place] deal with her emotional/non-hunger eating behaviors. Olivia Hamilton is stable without medications and she will continue  with her counselor. Orders and follow up as documented in patient record.   5. At risk for dehydration Olivia Hamilton was given approximately 9 minutes dehydration prevention counseling today. Olivia Hamilton is at risk for dehydration due to weight loss and current medication(s). She was encouraged to hydrate and monitor fluid status to avoid dehydration as well as weight loss plateaus.   6. Obesity with current BMI of 36.78 Olivia Hamilton is currently in the action  stage of change. As such, her goal is to continue with weight loss efforts. She has agreed to keeping a food journal and adhering to recommended goals of 1300-1400 calories and 80 grams of protein daily.   I discussed with the patient meal prep ideas and gave her recipes handout.  Exercise goals: As is.  Behavioral modification strategies: increasing lean protein intake, decreasing simple carbohydrates, meal planning and cooking strategies, and planning for success.  Olivia Hamilton has agreed to follow-up with our clinic in 2 to 3 weeks. She was informed of the importance of frequent follow-up visits to maximize her success with intensive lifestyle modifications for her multiple health conditions.   Objective:   Blood pressure 116/79, pulse 78, temperature 97.8 F (36.6 C), height 5\' 5"  (1.651 m), SpO2 98 %. Body mass index is 36.44 kg/m.  General: Cooperative, alert, well developed, in no acute distress. HEENT: Conjunctivae and lids unremarkable. Cardiovascular: Regular rhythm.  Lungs: Normal work of breathing. Neurologic: No focal deficits.   Lab Results  Component Value Date   CREATININE 0.95 03/05/2021   BUN 11 03/05/2021   NA 139 03/05/2021   K 4.2 03/05/2021   CL 102 03/05/2021   CO2 31 03/05/2021   Lab Results  Component Value Date   ALT 12 02/18/2021   AST 19 02/18/2021   GGT 13 01/28/2017   ALKPHOS 118 02/18/2021   BILITOT 0.5 02/18/2021   Lab Results  Component Value Date   HGBA1C 5.2 02/18/2021   HGBA1C 5.1 01/28/2017   Lab Results  Component Value Date   INSULIN 11.2 02/18/2021   Lab Results  Component Value Date   TSH <0.005 (L) 07/05/2020   Lab Results  Component Value Date   CHOL 219 (H) 02/18/2021   HDL 75 02/18/2021   LDLCALC 126 (H) 02/18/2021   TRIG 106 02/18/2021   CHOLHDL 3.0 01/28/2017   Lab Results  Component Value Date   VD25OH 47.7 02/18/2021   Lab Results  Component Value Date   WBC 5.9 02/18/2021   HGB 15.3 02/18/2021   HCT 46.6  02/18/2021   MCV 90 02/18/2021   PLT 234 02/18/2021   Lab Results  Component Value Date   IRON 81 01/28/2017   Attestation Statements:   Reviewed by clinician on day of visit: allergies, medications, problem list, medical history, surgical history, family history, social history, and previous encounter notes.   01/30/2017, am acting as transcriptionist for Trude Mcburney, DO.  I have reviewed the above documentation for accuracy and completeness, and I agree with the above. [employer], D.O.  The 21st Century Cures Act was signed into law in 2016 which includes the topic of electronic health records.  This provides immediate access to information in MyChart.  This includes consultation notes, operative notes, office notes, lab results and pathology reports.  If you have any questions about what you read please let 2017 know at your next visit so we can discuss your concerns and take corrective action if need be.  We are right  here with you.

## 2021-04-14 ENCOUNTER — Other Ambulatory Visit (INDEPENDENT_AMBULATORY_CARE_PROVIDER_SITE_OTHER): Payer: Self-pay | Admitting: Family Medicine

## 2021-04-14 DIAGNOSIS — E559 Vitamin D deficiency, unspecified: Secondary | ICD-10-CM

## 2021-04-14 NOTE — Progress Notes (Signed)
Office: [phone]  /  Fax: [phone]    Date: April 28, 2021   Appointment Start Time: 3:01pm Duration: 29 minutes Provider: Lawerance Cruel, Psy.D. Type of Session: Individual Therapy  Location of Patient: Work Economist) Location of Provider: Provider's home (private office) Type of Contact: Telepsychological Visit via MyChart Video Visit  Session Content: Olivia Hamilton is a 55 y.o. female presenting for a follow-up appointment to address the previously established treatment goal of increasing coping skills. Today's appointment was a telepsychological visit due to COVID-19. Olivia Hamilton provided verbal consent for today's telepsychological appointment and she is aware she is responsible for securing confidentiality on her end of the session. Prior to proceeding with today's appointment, Olivia Hamilton's physical location at the time of this appointment was obtained as well a phone number she could be reached at in the event of technical difficulties. Olivia Hamilton and this provider participated in today's telepsychological service. Of note, today's appointment was switched to a regular telephone call at 3:04pm with Olivia Hamilton's verbal consent due to technical issues.  This provider conducted a brief check-in. Olivia Hamilton shared about her recent appointment with [facility], FNP-C as well as ongoing work related stressors due to open enrollment. Olivia Hamilton expressed desire to focus further on coping with emotional eating behaviors. Thus, psychoeducation regarding triggers for emotional eating was provided. Olivia Hamilton was provided a handout, and encouraged to utilize the handout between now and the next appointment to increase awareness of triggers and frequency. Olivia Hamilton agreed. Psychoeducation regarding pleasurable activities, including its impact on emotional eating and overall well-being was provided. Olivia Hamilton was provided with a handout with various options of pleasurable activities, and was encouraged to engage in one activity a day and  additional activities as needed when triggered to emotionally eat. Olivia Hamilton agreed. Olivia Hamilton provided verbal consent during today's appointment for this provider to send a handout about triggers as well as pleasurable activities via e-mail. Overall, Olivia Hamilton was receptive to today's appointment as evidenced by openness to sharing, responsiveness to feedback, and willingness to engage in pleasurable activities to assist with coping.  Mental Status Examination:  Appearance: unable to assess  Behavior: unable to assess Mood: euthymic Affect: unable to fully assess Speech: normal in rate, volume, and tone Eye Contact: unable to assess Psychomotor Activity: unable to assess Gait: unable to assess Thought Process: linear, logical, and goal directed  Thought Content/Perception: no hallucinations, delusions, bizarre thinking or behavior reported or observed and no evidence or endorsement of suicidal and homicidal ideation, plan, and intent Orientation: time, person, place, and purpose of appointment Memory/Concentration: memory, attention, language, and fund of knowledge intact  Insight/Judgment: fair  Interventions:  Conducted a brief chart review Provided empathic reflections and validation Processed thoughts and feelings Psychoeducation provided regarding triggers for emotional eating Psychoeducation provided regarding pleasurable activities Employed supportive psychotherapy interventions to facilitate reduced distress, and to improve coping skills with identified stressors  DSM-5 Diagnosis(es):  F50.89 Other Specified Feeding or Eating Disorder, Emotional Eating Behaviors and 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 Olivia Hamilton has demonstrated progress in her goal as evidenced by increased awareness of hunger patterns. Olivia Hamilton also  demonstrates willingness to engage in pleasurable activities.  Plan: The next appointment will be scheduled in approximately three weeks, which will be via MyChart Video Visit. The next session will focus on working towards the established treatment goal. 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

## 2021-04-16 ENCOUNTER — Ambulatory Visit (INDEPENDENT_AMBULATORY_CARE_PROVIDER_SITE_OTHER): Payer: No Typology Code available for payment source | Admitting: Family Medicine

## 2021-04-24 ENCOUNTER — Ambulatory Visit (INDEPENDENT_AMBULATORY_CARE_PROVIDER_SITE_OTHER): Payer: No Typology Code available for payment source | Admitting: Family Medicine

## 2021-04-24 ENCOUNTER — Other Ambulatory Visit: Payer: Self-pay

## 2021-04-24 ENCOUNTER — Encounter (INDEPENDENT_AMBULATORY_CARE_PROVIDER_SITE_OTHER): Payer: Self-pay | Admitting: Family Medicine

## 2021-04-24 VITALS — BP 117/76 | HR 79 | Temp 97.8°F | Ht 65.0 in | Wt 223.0 lb

## 2021-04-24 DIAGNOSIS — F5089 Other specified eating disorder: Secondary | ICD-10-CM | POA: Insufficient documentation

## 2021-04-24 DIAGNOSIS — E8881 Metabolic syndrome: Secondary | ICD-10-CM

## 2021-04-24 DIAGNOSIS — E079 Disorder of thyroid, unspecified: Secondary | ICD-10-CM | POA: Insufficient documentation

## 2021-04-24 DIAGNOSIS — Z9189 Other specified personal risk factors, not elsewhere classified: Secondary | ICD-10-CM

## 2021-04-24 DIAGNOSIS — Z6836 Body mass index (BMI) 36.0-36.9, adult: Secondary | ICD-10-CM | POA: Diagnosis not present

## 2021-04-24 DIAGNOSIS — E88819 Insulin resistance, unspecified: Secondary | ICD-10-CM

## 2021-04-24 MED ORDER — OZEMPIC (0.25 OR 0.5 MG/DOSE) 2 MG/1.5ML ~~LOC~~ SOPN: 0.2500 mg | PEN_INJECTOR | SUBCUTANEOUS | 0 refills | Status: DC

## 2021-04-28 ENCOUNTER — Telehealth (INDEPENDENT_AMBULATORY_CARE_PROVIDER_SITE_OTHER): Payer: No Typology Code available for payment source | Admitting: Psychology

## 2021-04-28 DIAGNOSIS — F5089 Other specified eating disorder: Secondary | ICD-10-CM

## 2021-04-28 DIAGNOSIS — F4323 Adjustment disorder with mixed anxiety and depressed mood: Secondary | ICD-10-CM

## 2021-04-28 NOTE — Progress Notes (Signed)
Chief Complaint:   OBESITY Olivia Hamilton is here to discuss her progress with her obesity treatment plan along with follow-up of her obesity related diagnoses. Olivia Hamilton is on keeping a food journal and adhering to recommended goals of 1300-1400 calories and 80 grams of protein and states she is following her eating plan approximately 50% of the time. Olivia Hamilton states she is walking the dog 3 times a day for 40 minutes 7 times per week.  Today's visit was #: 4 Starting weight: 221 lbs Starting date: 02/18/2021 Today's weight: 223 lbs Today's date: 04/24/2021 Total lbs lost to date: 0 Total lbs lost since last in-office visit: 0  Interim History: Olivia Hamilton is battling food cravings (sugar and chocolate). She notes hunger at times but mostly cravings. She is allergic to dairy and gluten. She is able to tolerate small amounts of dairy in chocolate. She drinks adequate water. She is not journaling much at all.   Subjective:   1. Insulin resistance Olivia Hamilton notes polyphagia.  Lab Results  Component Value Date   INSULIN 11.2 02/18/2021   Lab Results  Component Value Date   HGBA1C 5.2 02/18/2021    2. Other Specified Feeding or Eating Disorder, Emotional Eating Behaviors  Olivia Hamilton is seeing Dr. Dewaine Conger and feels this is helpful. She notes snacking too much at work. She states people leave out candy on their desks.   3. At risk for side effect of medication Olivia Hamilton is at risk for side effect of medication due to start of Ozempic.  Assessment/Plan:   1. Insulin resistance Start Ozempic 0.25 mg weekly today. She notes no history of cholecystitis, cholelithiasis , family/personal history of thyroid cancer.Olivia Hamilton will continue to work on weight loss, exercise, and decreasing simple carbohydrates to help decrease the risk of diabetes. Olivia Hamilton agreed to follow-up with [place] as directed to closely monitor her progress.  - Semaglutide,0.25 or 0.5MG /DOS, (OZEMPIC, 0.25 OR 0.5 MG/DOSE,) 2 MG/1.5ML SOPN; Inject 0.25 mg  into the skin once a week.  Dispense: 1.5 mL; Refill: 0  2. Other Specified Feeding or Eating Disorder, Emotional Eating Behaviors  I discussed with Olivia Hamilton strategies to avoid snacking. She will follow up with Dr. Dewaine Conger - she has an upcoming appointment.  3. At risk for side effect of medication Olivia Hamilton was given approximately 15 minutes of drug side effect counseling today.  We discussed side effect possibility and risk versus benefits. Olivia Hamilton agreed to the medication and will contact this office if these side effects are intolerable.  Repetitive spaced learning was employed today to elicit superior memory formation and behavioral change.   4. Obesity with current BMI of 37.11 Olivia Hamilton is currently in the action stage of change. As such, her goal is to continue with weight loss efforts. She has agreed to keeping a food journal and adhering to recommended goals of 1300-1400 calories and 80 grams of protein daily.  Exercise goals:  As is.  Behavioral modification strategies: decreasing simple carbohydrates and keeping a strict food journal.  Olivia Hamilton has agreed to follow-up with our clinic in 2-3 weeks.  Objective:   Blood pressure 117/76, pulse 79, temperature 97.8 F (36.6 C), temperature source Oral, height 5\' 5"  (1.651 m), weight 223 lb (101.2 kg), SpO2 99 %. Body mass index is 37.11 kg/m.  General: Cooperative, alert, well developed, in no acute distress. HEENT: Conjunctivae and lids unremarkable. Cardiovascular: Regular rhythm.  Lungs: Normal work of breathing. Neurologic: No focal deficits.   Lab Results  Component Value Date   CREATININE  0.95 03/05/2021   BUN 11 03/05/2021   NA 139 03/05/2021   K 4.2 03/05/2021   CL 102 03/05/2021   CO2 31 03/05/2021   Lab Results  Component Value Date   ALT 12 02/18/2021   AST 19 02/18/2021   GGT 13 01/28/2017   ALKPHOS 118 02/18/2021   BILITOT 0.5 02/18/2021   Lab Results  Component Value Date   HGBA1C 5.2 02/18/2021   HGBA1C 5.1  01/28/2017   Lab Results  Component Value Date   INSULIN 11.2 02/18/2021   Lab Results  Component Value Date   TSH <0.005 (L) 07/05/2020   Lab Results  Component Value Date   CHOL 219 (H) 02/18/2021   HDL 75 02/18/2021   LDLCALC 126 (H) 02/18/2021   TRIG 106 02/18/2021   CHOLHDL 3.0 01/28/2017   Lab Results  Component Value Date   VD25OH 47.7 02/18/2021   Lab Results  Component Value Date   WBC 5.9 02/18/2021   HGB 15.3 02/18/2021   HCT 46.6 02/18/2021   MCV 90 02/18/2021   PLT 234 02/18/2021   Lab Results  Component Value Date   IRON 81 01/28/2017   Attestation Statements:   Reviewed by clinician on day of visit: allergies, medications, problem list, medical history, surgical history, family history, social history, and previous encounter notes.  I, Jackson Latino, RMA, am acting as Energy manager for [place], FNP.  I have reviewed the above documentation for accuracy and completeness, and I agree with the above. -  Jesse Sans, FNP

## 2021-05-06 ENCOUNTER — Encounter: Payer: Self-pay | Admitting: Registered Nurse

## 2021-05-06 ENCOUNTER — Telehealth: Payer: Self-pay | Admitting: Registered Nurse

## 2021-05-06 DIAGNOSIS — R5383 Other fatigue: Secondary | ICD-10-CM

## 2021-05-06 NOTE — Progress Notes (Unsigned)
  Office: [phone]  /  Fax: [phone]    Date: May 20, 2021   Appointment Start Time: *** Duration: *** minutes Provider: Lawerance Cruel, Psy.D. Type of Session: Individual Therapy  Location of Patient: {gbptloc:23249} Location of Provider: Provider's Home (private office) Type of Contact: Telepsychological Visit via MyChart Video Visit  Session Content: Olivia Hamilton is a 55 y.o. female presenting for a follow-up appointment to address the previously established treatment goal of increasing coping skills.Today's appointment was a telepsychological visit due to COVID-19. [name] provided verbal consent for today's telepsychological appointment and she is aware she is responsible for securing confidentiality on her end of the session. Prior to proceeding with today's appointment, [name]'s physical location at the time of this appointment was obtained as well a phone number she could be reached at in the event of technical difficulties. [name] and this provider participated in today's telepsychological service.   This provider conducted a brief check-in. *** [name] was receptive to today's appointment as evidenced by openness to sharing, responsiveness to feedback, and {gbreceptiveness:23401}.  Mental Status Examination:  Appearance: {Appearance:22431} Behavior: {Behavior:22445} Mood: {gbmood:21757} Affect: {Affect:22436} Speech: {Speech:22432} Eye Contact: {Eye Contact:22433} Psychomotor Activity: {Motor Activity:22434} Gait: {gbgait:23404} Thought Process: {thought process:22448}  Thought Content/Perception: {disturbances:22451} Orientation: {Orientation:22437} Memory/Concentration: {gbcognition:22449} Insight/Judgment: {Insight:22446}  Interventions:  {Interventions for Progress Notes:23405}  DSM-5 Diagnosis(es):  F50.89 Other Specified Feeding or Eating Disorder, Emotional Eating Behaviors and 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 [name] has demonstrated progress in her goal as evidenced by {gbtxprogress:22839}. [name] also {gbtxprogress2:22951}.  Plan: The next appointment will be scheduled in {gbweeks:21758}, which will be {gbtxmodality:23402}. The next session will focus on {Plan for Next Appointment:23400}.

## 2021-05-06 NOTE — Telephone Encounter (Signed)
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 Per up to date no studies have shown benefit for chronic epstein barr but decreased viral shedding/load noted.  Discussed with patient she could have response since viral load decreased and typically a trial of valacyclovir not harmful. Lysine dietary supplement.  Fatigue chronic and work stress/still grieving mother's death.  Discussed with patient lysine has been used to help with anxiety/stress per NIH website and dosing up to TID no more than 4g per day.  Patient reported she has been trying to be active, walking her dog.  Discussed healthy varied diet fruits/vegetables/protein lean; sunlight every day and trying to ensure sleep 7-8 hours per night.  Discussed exercise beneficial in any amount e.g. 5 minutes and up throughout the day as beneficial as one longer session and breaking up sitting in office may be more helpful to her at this time.  She feels her energy level is used up quickly during the work day and she cannot re-energize later in the day.  Labwork pending for lyme and other diseases per patient had more than 8 vials drawn at [facility] office. Discussed with patient chronic fatigue has been seen after viral illnesses including Epstein barr, covid,  and many others.  Research ongoing and new study was just published this week regarding long covid/chronic fatigue.  Discussed with patient I would check Epic since in patient office at time of discussion and I was not able to access her chart.  No results available in Epic tonight drawn in the past week.  Patient verbalized understanding information/instructions, agreed with plan of care and follow up with PCM.

## 2021-05-09 ENCOUNTER — Encounter (INDEPENDENT_AMBULATORY_CARE_PROVIDER_SITE_OTHER): Payer: Self-pay | Admitting: Adult Health

## 2021-05-13 ENCOUNTER — Telehealth: Payer: Self-pay

## 2021-05-13 ENCOUNTER — Telehealth (INDEPENDENT_AMBULATORY_CARE_PROVIDER_SITE_OTHER): Payer: No Typology Code available for payment source | Admitting: Family

## 2021-05-13 ENCOUNTER — Other Ambulatory Visit: Payer: Self-pay

## 2021-05-13 ENCOUNTER — Ambulatory Visit: Payer: No Typology Code available for payment source | Admitting: Family Medicine

## 2021-05-13 ENCOUNTER — Encounter: Payer: Self-pay | Admitting: Family

## 2021-05-13 VITALS — Ht 66.0 in | Wt 210.0 lb

## 2021-05-13 DIAGNOSIS — B2790 Infectious mononucleosis, unspecified without complication: Secondary | ICD-10-CM | POA: Diagnosis not present

## 2021-05-13 DIAGNOSIS — F4321 Adjustment disorder with depressed mood: Secondary | ICD-10-CM | POA: Diagnosis not present

## 2021-05-13 DIAGNOSIS — R5383 Other fatigue: Secondary | ICD-10-CM | POA: Diagnosis not present

## 2021-05-13 NOTE — Telephone Encounter (Signed)
[facility] Night - Client TELEPHONE ADVICE RECORD AccessNurse Patient Name: Olivia Hamilton [facility] Gender: Female DOB: 1966-03-15 Age: 55 Y 9 M 29 D Return Phone Number: [phone] (Primary) Address: City/ State/ Zip: [place] [address] Client [name] Night - Client Client Site  [facility] - Night Physician AA - PHYSICIAN, Crissie Figures- MD Contact Type Call Who Is Calling Patient / Member / Family / Caregiver Call Type Triage / Clinical Relationship To Patient Self Return Phone Number [phone] (Primary) Chief Complaint Headache Reason for Call Symptomatic / Request for Health Information Initial Comment Caller states she has a sore throat and headaches. Caller states she has an appointment schedule and would like to know if she should still come in. Translation No No Triage Reason Patient declined Nurse Assessment Nurse: Doylene Canard, RN, Lesa Date/Time Lamount Cohen Time): 05/13/2021 9:04:21 AM Confirm and document reason for call. If symptomatic, describe symptoms. ---Caller states she has a sore throat, chills and headache. Temperature is unknown. She has an appt today at 9 am for Malachi Carl Does the patient have any new or worsening symptoms? ---Yes Will a triage be completed? ---No Select reason for no triage. ---Patient declined Disp. Time Lamount Cohen Time) Disposition Final User 05/13/2021 9:15:40 AM Clinical Call Yes Conner, RN, Lesa Comments User: Ocie Doyne, RN Date/Time Lamount Cohen Time): 05/13/2021 9:15:22 AM Caller was warm transferred to the office for a virtual visit

## 2021-05-13 NOTE — Telephone Encounter (Signed)
Pt already has appt with Ria Clock FNP 05/13/21 at 11AM. Sending note to Ria Clock FNP.

## 2021-05-13 NOTE — Telephone Encounter (Signed)
[facility] Night - Client TELEPHONE ADVICE RECORD AccessNurse Patient Name: Olivia SHA Hamilton Gender: Female DOB: 02/26/1966 Age: 54 Y 9 M 29 D Return Phone Number: [phone] (Primary) Address: City/ State/ Zip: [place] [address] Client [facility] Night - Client Client Site [facility] - Night Physician AA - PHYSICIAN, UNKNOWN- MD Contact Type Call Who Is Calling Patient / Member / Family / Caregiver Call Type Triage / Clinical Relationship To Patient Self Return Phone Number [phone] (Primary) Chief Complaint Headache Reason for Call Symptomatic / Request for Health Information Initial Comment Caller states she has a sore throat and headaches. Caller states she has an appointment schedule and would like to know if she should still come in. Translation No No Triage Reason Patient declined Nurse Assessment Nurse: Conner, RN, Lesa Date/Time (Eastern Time): 05/13/2021 9:04:21 AM Confirm and document reason for call. If symptomatic, describe symptoms. ---Caller states she has a sore throat, chills and headache. Temperature is unknown. She has an appt today at 9 am for Epstein Barr Does the patient have any new or worsening symptoms? ---Yes Will a triage be completed? ---No Select reason for no triage. ---Patient declined Disp. Time (Eastern Time) Disposition Final User 05/13/2021 9:15:40 AM Clinical Call Yes Conner, RN, Lesa Comments User: Lesa, Conner, RN Date/Time (Eastern Time): 05/13/2021 9:15:22 AM Caller was warm transferred to the office for a virtual visit 

## 2021-05-15 ENCOUNTER — Telehealth (INDEPENDENT_AMBULATORY_CARE_PROVIDER_SITE_OTHER): Payer: Self-pay

## 2021-05-15 ENCOUNTER — Other Ambulatory Visit: Payer: Self-pay

## 2021-05-15 ENCOUNTER — Telehealth (INDEPENDENT_AMBULATORY_CARE_PROVIDER_SITE_OTHER): Payer: No Typology Code available for payment source | Admitting: Adult Health

## 2021-05-15 DIAGNOSIS — Z6836 Body mass index (BMI) 36.0-36.9, adult: Secondary | ICD-10-CM

## 2021-05-15 NOTE — Telephone Encounter (Signed)
Left message for pt to return call. MyChart message also sent to pt.  Mekiyah Gladwell LPN

## 2021-05-16 NOTE — Progress Notes (Signed)
Olivia Hamilton is a 55 y.o. female with the following history as recorded in EpicCare:  Patient Active Problem List   Diagnosis Date Noted   Insulin resistance 04/24/2021   Other Specified Feeding or Eating Disorder, Emotional Eating Behaviors  04/24/2021   Class 2 severe obesity with serious comorbidity and body mass index (BMI) of 36.0 to 36.9 in adult (HCC) 02/19/2021   Depression screening 02/18/2021   Benign essential HTN 02/18/2021   Hypothyroidism (acquired) 08/05/2020    Current Outpatient Medications  Medication Sig Dispense Refill   aspirin 81 MG tablet Take 81 mg by mouth daily.      Ca Phosphate-Cholecalciferol (CALCIUM/VITAMIN D3 GUMMIES) 250-350 MG-UNIT CHEW Chew 250 mg by mouth. Two daily     estradiol (ESTRACE) 0.1 MG/GM vaginal cream Place 1 Applicatorful vaginally at bedtime. Compound Rx     fluticasone (FLONASE) 50 MCG/ACT nasal spray Place 1 spray into both nostrils 2 (two) times daily as needed for allergies or rhinitis. 48 mL 3   levothyroxine (SYNTHROID, LEVOTHROID) 88 MCG tablet Take 88 mcg by mouth daily before breakfast.     liothyronine (CYTOMEL) 50 MCG tablet Take 1 tablet by mouth daily.     loratadine (CLARITIN) 10 MG tablet Take 10 mg by mouth daily.     NON FORMULARY compounded medication  ESTRIOL     progesterone (PROMETRIUM) 100 MG capsule Take 1 capsule by mouth daily. For 12 days     spironolactone (ALDACTONE) 25 MG tablet Take 1 tablet (25 mg total) by mouth daily. 90 tablet 3   Vitamin D, Ergocalciferol, (DRISDOL) 1.25 MG (50000 UNIT) CAPS capsule 1 po q wed and 1 po q sun 8 capsule 0   Vitamins A & D (VITAMIN A & D) [address] units TABS Take 25,000 Units by mouth. 3 x weekly     Ascorbic Acid (VITAMIN C) 1000 MG tablet Take 1,000 mg by mouth daily.     Semaglutide,0.25 or 0.5MG /DOS, (OZEMPIC, 0.25 OR 0.5 MG/DOSE,) 2 MG/1.5ML SOPN Inject 0.25 mg into the skin once a week. 1.5 mL 0   sodium chloride (OCEAN) 0.65 % SOLN nasal spray Place 2 sprays  into both nostrils every 2 (two) hours while awake.  0   No current facility-administered medications for this visit.    Allergies: Gluten meal, Sulfonamide derivatives, and Whey protein [protein]  Past Medical History:  Diagnosis Date   ADD (attention deficit disorder)    Depression    Dry mouth    Gallbladder disease    Hashimoto's disease    High blood pressure    PCOS (polycystic ovarian syndrome)    Swelling    Thyroid disease    Varicose veins     Past Surgical History:  Procedure Laterality Date   OVARIAN CYST REMOVAL      Family History  Problem Relation Age of Onset   Obesity Mother    Thyroid disease Mother    Other Mother        VARICOSE VEINS   Arthritis Mother    Hypertension Mother    Obesity Father    Cancer Father    Hypertension Father    Lung cancer Father     Social History   Tobacco Use   Smoking status: Never   Smokeless tobacco: Never  Substance Use Topics   Alcohol use: Yes    Alcohol/week: 2.0 standard drinks    Types: 1 Glasses of wine, 1 Cans of beer per week    Subjective:  I connected with Olivia Hamilton on 05/16/21 at 11:00 AM EDT by a video enabled telemedicine application and verified that I am speaking with the correct person using two identifiers.   I discussed the limitations of evaluation and management by telemedicine and the availability of in person appointments. The patient expressed understanding and agreed to proceed. Provider in office/ patient is at home; provider and patient are only 2 people on video call.    Patient was diagnosed with EBV by her integrative provider Olivia Hamilton in [place] recently; also has suffered loss of her mom earlier this year; struggling with fatigue; has done research indicating rest and time is recommended treatment for EBV; wonders if there are other options;     Objective:  Vitals:   05/13/21 1111  Weight: 210 lb (95.3 kg)  Height: 5\' 6"  (1.676 m)    General: Well developed,  well nourished, in no acute distress  Skin : Warm and dry.  Head: Normocephalic and atraumatic  Lungs: Respirations unlabored;  Neurologic: Alert and oriented; speech intact; face symmetrical;   Assessment:  1. EBV infection   2. Other fatigue   3. Grief     Plan:  Reassurance- encouraged rest and fluids; have encouraged patient to reach out to her HR department to discuss FMLA to allow her time to rest/ grieve;  She is also encouraged to establish with new PCP as her PCP recently left office;  She will see her weight loss provider and integrative provider as scheduled as well.   No follow-ups on file.  No orders of the defined types were placed in this encounter.   Requested Prescriptions    No prescriptions requested or ordered in this encounter

## 2021-05-19 NOTE — Progress Notes (Signed)
Appt. Cancelled

## 2021-05-20 ENCOUNTER — Telehealth (INDEPENDENT_AMBULATORY_CARE_PROVIDER_SITE_OTHER): Payer: No Typology Code available for payment source | Admitting: Psychology

## 2021-05-23 ENCOUNTER — Ambulatory Visit (INDEPENDENT_AMBULATORY_CARE_PROVIDER_SITE_OTHER): Payer: No Typology Code available for payment source | Admitting: Adult Health

## 2021-05-23 NOTE — Telephone Encounter (Signed)
Would you like me to offer her a video visit for c/o H/A ?

## 2021-06-06 ENCOUNTER — Telehealth: Payer: Self-pay | Admitting: Registered Nurse

## 2021-06-06 ENCOUNTER — Encounter: Payer: Self-pay | Admitting: Registered Nurse

## 2021-06-06 DIAGNOSIS — R87619 Unspecified abnormal cytological findings in specimens from cervix uteri: Secondary | ICD-10-CM | POA: Insufficient documentation

## 2021-06-06 DIAGNOSIS — R197 Diarrhea, unspecified: Secondary | ICD-10-CM

## 2021-06-06 NOTE — Telephone Encounter (Signed)
Late entry Spoke with patient via telephone call.  Diarrhea started 0300 09/29 woke her up and continued through afternoon.  Tolerated po intake.  Denied dizziness/blood in stool.  Does have decreased appetite and chills.  Denied fever but did not check temperature as could not find her thermometer.  Planning to buy another one.  Home covid test negative.  I have recommended clear fluids and advanced to soft as tolerated.  Avoid large portions dairy, spicy and fried foods until diarrhea resolves. Avoid dehydration drink noncaffeinated beverages (water, ginger ale, soup broth, popsicles, no sugar added gatorade/powerade) to urinate every 2-4 hours pale yellow urine. If drinking tea ginger/chamomile to alternate with water to avoid dehydration.  Juice/drinks high in sugar may also worsen diarrhea.   It is easy to become dehydrated when having diarrhea along with electrolyte imbalances.  Patient to take  temperature and if less than 100.5 F and no chills may take over the counter Imodium per manufacturer's instructions.  Patient sent exitcare handouts on diarrhea and foods to relieve diarrhea. Medications as directed.  Contact clinic staff if symptoms persist or worsen; I have alerted the patient to call if high fever, dehydration, marked weakness, fainting, increased abdominal pain, blood in stool or vomit. If no further diarrhea/chills or new symptoms overnight may return to office if feeling well otherwise I recommend working from home tomorrow 9/30.  HR notified cleared to return onsite 9/30 as long as all symptoms resolved.  If new symptoms patient to repeat covid test in 48 hours and continue working remote from home.  Discussed with patient GI upset has not been common in community recently.  Some individuals with covid have had GI upset seen in clinic this week.  Flu typically high fever accompanies symptoms.  Patient lives alone with dog.  Patient verbalized agreement and understanding of treatment plan and  had no further questions at this time.  Patient A&Ox3 spoke full sentences without difficulty no cough/congestion/throat clearing audible during 7 minute telephone call. P2: Hand washing and fitness

## 2021-06-10 ENCOUNTER — Ambulatory Visit (INDEPENDENT_AMBULATORY_CARE_PROVIDER_SITE_OTHER): Payer: No Typology Code available for payment source | Admitting: Adult Health

## 2021-06-10 ENCOUNTER — Encounter (INDEPENDENT_AMBULATORY_CARE_PROVIDER_SITE_OTHER): Payer: Self-pay | Admitting: Adult Health

## 2021-06-10 ENCOUNTER — Other Ambulatory Visit: Payer: Self-pay

## 2021-06-10 VITALS — BP 133/86 | HR 72 | Temp 98.2°F | Ht 65.0 in | Wt 225.0 lb

## 2021-06-10 DIAGNOSIS — E8881 Metabolic syndrome: Secondary | ICD-10-CM

## 2021-06-10 DIAGNOSIS — Z6836 Body mass index (BMI) 36.0-36.9, adult: Secondary | ICD-10-CM | POA: Diagnosis not present

## 2021-06-10 DIAGNOSIS — E559 Vitamin D deficiency, unspecified: Secondary | ICD-10-CM | POA: Insufficient documentation

## 2021-06-10 DIAGNOSIS — Z9189 Other specified personal risk factors, not elsewhere classified: Secondary | ICD-10-CM | POA: Diagnosis not present

## 2021-06-10 MED ORDER — RYBELSUS 3 MG PO TABS: 3.0000 mg | ORAL_TABLET | Freq: Every day | ORAL | 0 refills | Status: DC

## 2021-06-10 MED ORDER — VITAMIN D (ERGOCALCIFEROL) 1.25 MG (50000 UNIT) PO CAPS: ORAL_CAPSULE | ORAL | 0 refills | Status: AC

## 2021-06-10 NOTE — Progress Notes (Signed)
Chief Complaint:   OBESITY Olivia Hamilton is here to discuss her progress with her obesity treatment plan along with follow-up of her obesity related diagnoses. Olivia Hamilton is on keeping a food journal and adhering to recommended goals of 1300-1400 calories and 80 grams of protein and states she is following her eating plan approximately 50% of the time. Olivia Hamilton states she is walking for 30 minutes 5 times per week.  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   Treated with increased fluid, increased rest, recommended to use OTC acetaminophen, and increase vitamin C to 1000 mg QD.   Due to concern for GI upset, she never started her Ozempic prescription.  She prefers a daily dose versus weekly dose, again due to concerns of medication SE.  Of note:  She report gluten and casein allergies.  Subjective:   1. Vitamin D deficiency Vitamin D level on 02/18/2021 - 47.7 - below goal. She is currently taking prescription ergocalciferol 50,000 IU each week. She denies nausea, vomiting or muscle weakness.  Lab Results  Component Value Date   VD25OH 47.7 02/18/2021   2. Insulin resistance Due to concern for GI upset, she never started her Ozempic prescription.  She prefers a daily dose versus weekly dose, again due to concerns of medication SE. On 02/18/2021, BG 86, A1c 5.2, Insulin level 11.2 She denies family hx of MTC or personal hx of pancreatitis.  3. At risk for nausea Olivia Hamilton is at risk for nausea due to starting oral GLP-1.  Assessment/Plan:   1. Vitamin D deficiency Refill ergocalciferol 50,000 IU once weekly, as per below.  - Refill Vitamin D, Ergocalciferol, (DRISDOL) 1.25 MG (50000 UNIT) CAPS capsule; Take one cap once  weekly  Dispense: 4 capsule; Refill: 0  2. Insulin resistance Remain off Ozempic. Start Rybelsus 3 mg daily, as per below.  - Start Semaglutide (RYBELSUS) 3 MG TABS; Take 3 mg by mouth daily.  Dispense: 30 tablet; Refill: 0  3. At risk for nausea Olivia Hamilton was given approximately 15 minutes of nausea prevention counseling today. Olivia Hamilton is at risk for nausea due to her new or current medication. She was encouraged to titrate her medication slowly, make sure to stay hydrated, eat smaller portions throughout the day, and avoid high fat meals.   4. Obesity with current BMI of 37.4  Olivia Hamilton is currently in the action stage of change. As such, her goal is to continue with weight loss efforts. She has agreed to keeping a food journal and adhering to recommended goals of 1300-1400 calories and 80 grams of protein.   Check fasting labs at next office visit.  Exercise goals:  As is.  Behavioral modification strategies: increasing lean protein intake, decreasing simple carbohydrates, meal planning and cooking strategies, keeping healthy foods in the home, and planning for success.  Olivia Hamilton has agreed to follow-up with our clinic in 3 weeks, fasting. She was informed of the importance of frequent follow-up visits to maximize her success with intensive lifestyle modifications for her multiple health conditions.   Objective:   Blood pressure 133/86, pulse 72, temperature 98.2 F (36.8 C), height 5\' 5"  (1.651 m), weight 225 lb (102.1 kg), SpO2 99 %. Body mass index is 37.44 kg/m.  General: Cooperative,  alert, well developed, in no acute distress. HEENT: Conjunctivae and lids unremarkable. Cardiovascular: Regular rhythm.  Lungs: Normal work of breathing. Neurologic: No focal deficits.   Lab Results  Component Value Date   CREATININE 0.95 03/05/2021   BUN 11 03/05/2021   NA 139 03/05/2021   K 4.2 03/05/2021   CL 102 03/05/2021   CO2 31 03/05/2021   Lab Results  Component Value Date    ALT 12 02/18/2021   AST 19 02/18/2021   GGT 13 01/28/2017   ALKPHOS 118 02/18/2021   BILITOT 0.5 02/18/2021   Lab Results  Component Value Date   HGBA1C 5.2 02/18/2021   HGBA1C 5.1 01/28/2017   Lab Results  Component Value Date   INSULIN 11.2 02/18/2021   Lab Results  Component Value Date   TSH <0.005 (L) 07/05/2020   Lab Results  Component Value Date   CHOL 219 (H) 02/18/2021   HDL 75 02/18/2021   LDLCALC 126 (H) 02/18/2021   TRIG 106 02/18/2021   CHOLHDL 3.0 01/28/2017   Lab Results  Component Value Date   VD25OH 47.7 02/18/2021   Lab Results  Component Value Date   WBC 5.9 02/18/2021   HGB 15.3 02/18/2021   HCT 46.6 02/18/2021   MCV 90 02/18/2021   PLT 234 02/18/2021   Lab Results  Component Value Date   IRON 81 01/28/2017   Attestation Statements:   Reviewed by clinician on day of visit: allergies, medications, problem list, medical history, surgical history, family history, social history, and previous encounter notes.  I, Insurance claims handler, CMA, am acting as Energy manager for William Hamburger, NP.  I have reviewed the above documentation for accuracy and completeness, and I agree with the above. -  Amanada Philbrick d. Olivia Hoston, NP-C

## 2021-06-12 NOTE — Telephone Encounter (Signed)
Late entry spoke with patient in her office 06/10/2021.  Stated she returned onsite 9/30.  Feeling well today no further questions or concerns at this time.  Encounter closed.

## 2021-07-04 ENCOUNTER — Other Ambulatory Visit (INDEPENDENT_AMBULATORY_CARE_PROVIDER_SITE_OTHER): Payer: Self-pay | Admitting: Adult Health

## 2021-07-04 DIAGNOSIS — E559 Vitamin D deficiency, unspecified: Secondary | ICD-10-CM

## 2021-07-07 NOTE — Telephone Encounter (Signed)
Pt was last seen by [employer].

## 2021-07-07 NOTE — Telephone Encounter (Signed)
Last seen Dr. O

## 2021-07-08 ENCOUNTER — Telehealth: Payer: Self-pay | Admitting: Registered Nurse

## 2021-07-08 ENCOUNTER — Encounter: Payer: Self-pay | Admitting: Registered Nurse

## 2021-07-08 DIAGNOSIS — J301 Allergic rhinitis due to pollen: Secondary | ICD-10-CM | POA: Insufficient documentation

## 2021-07-08 MED ORDER — FLUTICASONE PROPIONATE 50 MCG/ACT NA SUSP: 1.0000 | Freq: Two times a day (BID) | NASAL | 3 refills | Status: DC | PRN

## 2021-07-08 NOTE — Telephone Encounter (Signed)
Patient requested flonase refill as Rx expired to her pharmacy.  Electronic Rx fluticasone nasal 1 spray each nostril BID #[id number] RF3 sent to her pharmacy of choice for seasonal allergy use.  Patient verbalized understanding information/instructions, agreed with plan of care and had no further questions at this time.

## 2021-07-09 ENCOUNTER — Ambulatory Visit (INDEPENDENT_AMBULATORY_CARE_PROVIDER_SITE_OTHER): Payer: No Typology Code available for payment source | Admitting: Adult Health

## 2021-07-18 ENCOUNTER — Telehealth: Payer: Self-pay | Admitting: *Deleted

## 2021-07-18 DIAGNOSIS — J0101 Acute recurrent maxillary sinusitis: Secondary | ICD-10-CM

## 2021-07-18 MED ORDER — ALBUTEROL SULFATE HFA 108 (90 BASE) MCG/ACT IN AERS: 2.0000 | INHALATION_SPRAY | Freq: Four times a day (QID) | RESPIRATORY_TRACT | 0 refills | Status: DC | PRN

## 2021-07-18 NOTE — Telephone Encounter (Signed)
Clinic notified that pt returned to work today from a conference this week but left early due to illness.  Spoke with pt by phone. She reports conference was in [facility], so dry air. Thought this may be contributing to sx. Took Covid test this morning 11/11 that was negative. Endorses PND and cough becoming productive today. Feels tired, weak but also traveled from 0630 to midnight yesterday getting home.  Recommended nasal saline q2h and as nasal rinse, phenylephrine, Delsym, honey for cough, also sent in albuterol inh as she reports cough is hard to break at times.  Will repeat covid test in 48 hr. Weekend f/u ahead of possible RTW Monday 11/14 based on sx and repeat testing.

## 2021-07-19 ENCOUNTER — Encounter: Payer: Self-pay | Admitting: *Deleted

## 2021-07-19 NOTE — Telephone Encounter (Signed)
Patient returned call symptoms unchanged.  Meds helping some. Plans to covid restest tomorrow.  Patient reported was exposed to a lot of cigarette smoke in [facility] and while traveling and she is very sensitive to cigarette smoke.  Patient stated travel home was 0600 to after midnight and got wore out also.  Discussed if second covid test negative may return to work 07/21/21 but if still symptomatic I recommend mask wear around others to prevent spread of viral illness to coworkers. Discussed hydration/eating healthy/resting this weekend. Continue phenylephrine, nasal saline, flonase, cough suppressant OTC, albuterol MDI prn.  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  Patient with congestion, no cough/throat clearing audible during 7 minute telephone call.  Patient A&Ox3 spoke full sentences without difficulty.  HR notified covid retest pending tomorrow and re-evaluation 07/20/21.  Patient verbalized understanding information/instructions, agreed with plan of care and had no further questions at this time.

## 2021-07-20 NOTE — Telephone Encounter (Signed)
Patient contacted via telephone stated sinus pain/pressure/headache/nasal congestion worsening today.  Cough and runny nose/sneezing improved.  Plans to work remote tomorrow.  Used flonase and nasal saline once today slept in until 1400 today.  Wondering if she should start afrin for congestion discussed 3 days use per manufacturer instructions then recommend stopping to prevent rebound nasal swelling as this drug works by constructing blood vessels.  Definitely stop within 5 days of use.  Increase water intake and frequency of nasal saline use.  Discussed nasal saline first then flonase then afrin.  Recommended waiting 10-15 minutes after nasal saline before use of medicated sprays and blow nose well prior to medicated sprays.  Reviewed epic patient typically gets sinus infection in November 8-21st.  Azithromycin gives her the most relief.  She would like to discuss with coworker if they could bring to her mailbox versus [facility] pharmacy versus mail to home. Discussed for mail out tomorrow will need to know before 1300 to ensure goes out.   Patient reported her sister retired and no longer at [employer]/unable to pick up for her.  Patient with nasal congestion  and throat clearing noted during  15 minute telephone call.  no audible cough  HR notified cleared to return onsite when feeling well enough to return.  Patient to wear mask if in conference room/meeting with others until symptoms improving.  Patient does not eat in employee lunch room. Discussed no further covid tests indicated unless new or worsening symptoms.  Suspect viral illness that is not flu or covid at this time or sinus infection. Patient verbalized understanding information/instructions, agreed with plan of care and had no further questions at this time.

## 2021-07-21 MED ORDER — SALINE SPRAY 0.65 % NA SOLN: 2.0000 | NASAL | 0 refills | Status: DC

## 2021-07-21 MED ORDER — AZITHROMYCIN 250 MG PO TABS: ORAL_TABLET | ORAL | 0 refills | Status: DC

## 2021-07-21 NOTE — Telephone Encounter (Signed)
Spoke with pt by phone. Feels worse today than yesterday, "this pressure in my head". Did get Zpak from coworker delivered and started today. Plans to RTW onsite tomorrow 11/15. Continuing nasal saline, flonase.

## 2021-07-21 NOTE — Telephone Encounter (Signed)
Patient asked that we give PDRx azithromycin 250mg  sig take 2 day 1 then take 1 days 2-5 by mouth #6 RF0 to for delivery to her today prior to noon.  RN Salley Slaughter notified.

## 2021-07-21 NOTE — Telephone Encounter (Signed)
Symptoms worsening but starting azithromycin this afternoon. HR Tim delivered at noon today. Will follow up with patient tomorrow.  Agreed with RN Rolly Salter reinforcing flonase/nasal saline use.

## 2021-07-22 NOTE — Telephone Encounter (Signed)
Patient seen in her office today.  Symptoms improved from yesterday.  No cough/nasal congestion/throat clearing during 10 minute conversation.  A&Ox3 skin warm dry and pink respirations even and unlabored gait sure and steady in office.

## 2021-07-22 NOTE — Telephone Encounter (Signed)
Did RTW onsite today. Feeling much better than yesterday. Started abx yesterday. Feeling tired as she gets toward end of work day. Denies needs or concerns at this time.

## 2021-08-06 ENCOUNTER — Ambulatory Visit (INDEPENDENT_AMBULATORY_CARE_PROVIDER_SITE_OTHER): Payer: No Typology Code available for payment source | Admitting: Adult Health

## 2021-08-28 ENCOUNTER — Ambulatory Visit (INDEPENDENT_AMBULATORY_CARE_PROVIDER_SITE_OTHER): Payer: No Typology Code available for payment source | Admitting: Adult Health

## 2021-09-04 ENCOUNTER — Other Ambulatory Visit (INDEPENDENT_AMBULATORY_CARE_PROVIDER_SITE_OTHER): Payer: Self-pay | Admitting: Adult Health

## 2021-09-04 DIAGNOSIS — E559 Vitamin D deficiency, unspecified: Secondary | ICD-10-CM

## 2021-09-08 ENCOUNTER — Encounter: Payer: Self-pay | Admitting: *Deleted

## 2021-09-08 ENCOUNTER — Telehealth: Payer: Self-pay | Admitting: *Deleted

## 2021-09-08 DIAGNOSIS — H8110 Benign paroxysmal vertigo, unspecified ear: Secondary | ICD-10-CM

## 2021-09-08 DIAGNOSIS — I10 Essential (primary) hypertension: Secondary | ICD-10-CM

## 2021-09-08 NOTE — Telephone Encounter (Signed)
Pt emailed clinic staff reporting ears popping intermittently and dizziness to the point of needing to lie down to tolerate dizziness. Denies sinus pain/pressure, nasal congestion, ear pain, sore throat, PND. Denies n/v. She has flonase and nasal saline at home, has not been using recently. Advised she restart these for the fluid in ears. Discussed meclizine 25mg  po TID prn. Also discussed epley maneuver and sent instructions on this if she did not find relief with meclizine. Also sent handout on vertigo. Will f/u tomorrow for check in.

## 2021-09-08 NOTE — Patient Instructions (Signed)
Meclizine is the generic medication you can try for your symptoms. It is marketed as Dramamine "Less Drowsy" in the purple and yellow box. It can be taken every 8 hours.

## 2021-09-08 NOTE — Telephone Encounter (Signed)
Reviewed RN Rolly Salter note agreed with plan of care and will re-evaluate patient 09/09/2021

## 2021-09-09 NOTE — Telephone Encounter (Signed)
Patient contacted via telephone follow up vertigo symptoms.  Patient reported dramamine helped but made her very drowsy.  Ears not popping as much today.  Some post nasal drip.  Working remote today since she had the option but plans to be back in the office tomorrow as symptoms improved and drowsiness from dramamine wore off today.  Some fatigue noticed but unsure if related to vertigo or dramamine.  Cleared to return onsite 09/10/2020 HR notified.  Continue flonase and nasal saline. Discussed can take up to 30 days for fluid in middle ear to clear.  May need to redo epley maneuvers/meclizine dosing. Discussed do not drive during vertigo episodes.  Patient A&Ox3 spoke full sentences without difficulty; no cough/congestion/throat clearing audible during 5 minute telephone call.  Patient verbalized understanding information/instructions, agreed with plan of care and had no further questions at this time.

## 2021-09-10 NOTE — Telephone Encounter (Signed)
Patient reported vertigo worst with waking up in the am.  Did not feel safe driving today so worked remote again.  Taking flonase, nasal saline and decongestant today.  Not using dramamine/meclizine due to drowsiness side effect.  Called teledoc today and provider encouraged patient to do the same things we previously discussed.  Patient reported sister going to drive her to work tomorrow.  Discussed if vertigo persists tomorrow will perform ENT exam to rule out ear infection or other causes of vertigo.  Patient A&Ox3 spoke full sentences without difficulty.  No audible cough/congestion/throat clearing during 3 minute telephone call.  HR notified cleared to RTW onsite 09/11/21 and working remote today.  Patient verbalized understanding information/instructions, agreed with plan of care and had no further questions at this time.

## 2021-09-11 ENCOUNTER — Encounter: Payer: Self-pay | Admitting: Registered Nurse

## 2021-09-11 ENCOUNTER — Ambulatory Visit: Payer: Self-pay | Admitting: Registered Nurse

## 2021-09-11 ENCOUNTER — Other Ambulatory Visit: Payer: Self-pay

## 2021-09-11 VITALS — BP 122/92 | HR 86 | Temp 98.6°F | Resp 16

## 2021-09-11 DIAGNOSIS — H6980 Other specified disorders of Eustachian tube, unspecified ear: Secondary | ICD-10-CM

## 2021-09-11 DIAGNOSIS — H8110 Benign paroxysmal vertigo, unspecified ear: Secondary | ICD-10-CM

## 2021-09-11 DIAGNOSIS — J00 Acute nasopharyngitis [common cold]: Secondary | ICD-10-CM

## 2021-09-11 DIAGNOSIS — R030 Elevated blood-pressure reading, without diagnosis of hypertension: Secondary | ICD-10-CM

## 2021-09-11 MED ORDER — SALINE SPRAY 0.65 % NA SOLN: 2.0000 | NASAL | 0 refills | Status: DC

## 2021-09-11 NOTE — Progress Notes (Signed)
Subjective:    Patient ID: Olivia Hamilton, female    DOB: 08/20/1966, 56 y.o.   MRN: [id number]  55y/o caucasian female with room spinning on awakening 09/08/21 started dramamine and helped but made her too drowsy so stopped and has been using flonase/nasal saline/decongestant and it is helping and not causing her to be drowsy/unable to work.  Today first day driving and onsite at work.  Having room spinning with quick head movements but able to control symptoms by moving entire body as a unit instead of cervical rotation.  Had loud popping in ears last night denied discharge/fever/chills/headache/ear pain.  Some tenderness sides of neck noted with touching.  Stressful meeting at work prior to coming to appt.  Hasn't had much appetite and drinking less water this week than usual.     Review of Systems  Constitutional:  Positive for activity change, appetite change and fatigue. Negative for chills, diaphoresis and fever.  HENT:  Positive for congestion, ear pain, postnasal drip and rhinorrhea. Negative for dental problem, drooling, ear discharge, facial swelling, hearing loss, mouth sores, nosebleeds, sinus pressure, sinus pain, sneezing, tinnitus, trouble swallowing and voice change.   Eyes:  Negative for photophobia, pain, discharge, redness, itching and visual disturbance.  Respiratory:  Negative for cough, choking, chest tightness, shortness of breath, wheezing and stridor.   Cardiovascular:  Negative for chest pain.  Gastrointestinal:  Negative for diarrhea, nausea and vomiting.  Endocrine: Negative for cold intolerance and heat intolerance.  Genitourinary:  Negative for difficulty urinating.  Musculoskeletal:  Negative for gait problem, neck pain and neck stiffness.  Skin:  Negative for rash.  Allergic/Immunologic: Positive for environmental allergies and food allergies.  Neurological:  Positive for dizziness. Negative for tremors, seizures, syncope, facial asymmetry, speech difficulty,  weakness, numbness and headaches.  Hematological:  Negative for adenopathy. Does not bruise/bleed easily.  Psychiatric/Behavioral:  Negative for agitation, confusion and sleep disturbance.       Objective:   Physical Exam Vitals and nursing note reviewed.  Constitutional:      General: She is awake. She is not in acute distress.    Appearance: Normal appearance. She is well-developed, well-groomed and overweight. She is not ill-appearing, toxic-appearing or diaphoretic.  HENT:     Head: Normocephalic and atraumatic.     Jaw: There is normal jaw occlusion. No trismus.     Salivary Glands: Right salivary gland is not diffusely enlarged or tender. Left salivary gland is not diffusely enlarged or tender.     Right Ear: Hearing, ear canal and external ear normal. A middle ear effusion is present. There is no impacted cerumen.     Left Ear: Hearing, ear canal and external ear normal. A middle ear effusion is present. There is no impacted cerumen.     Nose: Mucosal edema, congestion and rhinorrhea present. No nasal deformity, septal deviation or laceration. Rhinorrhea is clear.     Right Turbinates: Enlarged and swollen. Not pale.     Left Turbinates: Enlarged and swollen. Not pale.     Right Sinus: No maxillary sinus tenderness or frontal sinus tenderness.     Left Sinus: No maxillary sinus tenderness or frontal sinus tenderness.     Mouth/Throat:     Lips: Pink. No lesions.     Mouth: Mucous membranes are moist. Mucous membranes are not pale, not dry and not cyanotic. No lacerations, oral lesions or angioedema.     Dentition: No dental abscesses or gum lesions.     Tongue: No  lesions. Tongue does not deviate from midline.     Palate: No mass and lesions.     Pharynx: Uvula midline. Pharyngeal swelling and posterior oropharyngeal erythema present. No oropharyngeal exudate or uvula swelling.     Tonsils: No tonsillar exudate or tonsillar abscesses. 0 on the right. 0 on the left.     Comments:  Cobblestoning posterior pharynx; bilateral TMs air fluid level clear; bilateral allergic shiners; nasal turbinates with edema erythema clear/yellow discharge bilaterally Eyes:     General: Lids are normal. Vision grossly intact. Gaze aligned appropriately. Allergic shiner present. No visual field deficit or scleral icterus.       Right eye: No foreign body, discharge or hordeolum.        Left eye: No foreign body, discharge or hordeolum.     Extraocular Movements: Extraocular movements intact.     Right eye: Normal extraocular motion and no nystagmus.     Left eye: Normal extraocular motion and no nystagmus.     Conjunctiva/sclera: Conjunctivae normal.     Right eye: Right conjunctiva is not injected. No chemosis, exudate or hemorrhage.    Left eye: Left conjunctiva is not injected. No chemosis, exudate or hemorrhage.    Pupils: Pupils are equal, round, and reactive to light. Pupils are equal.     Right eye: Pupil is round and reactive.     Left eye: Pupil is round and reactive.  Neck:     Thyroid: No thyroid mass, thyromegaly or thyroid tenderness.     Trachea: Trachea and phonation normal. No tracheal tenderness or tracheal deviation.      Comments: Slight tenderness to palpation bilateral lateral neck; no lymphadenopathy on palpation Cardiovascular:     Rate and Rhythm: Normal rate and regular rhythm.     Pulses:          Radial pulses are 1+ on the right side and 1+ on the left side.     Heart sounds: Normal heart sounds, S1 normal and S2 normal. Heart sounds not distant. No murmur heard. Pulmonary:     Effort: Pulmonary effort is normal. No accessory muscle usage or respiratory distress.     Breath sounds: Normal breath sounds and air entry. No stridor, decreased air movement or transmitted upper airway sounds. No decreased breath sounds, wheezing, rhonchi or rales.     Comments: BBS CTA; spoke full sentences without difficulty; no cough/throat clearing observed in exam room Chest:      Chest wall: No tenderness.  Abdominal:     General: There is no distension.     Palpations: Abdomen is soft.  Musculoskeletal:        General: No tenderness. Normal range of motion.     Right shoulder: Normal. No swelling, deformity, effusion, laceration or crepitus. Normal range of motion.     Left shoulder: Normal. No swelling, deformity, effusion, laceration or crepitus. Normal range of motion.     Right hand: Normal. Normal strength. Normal capillary refill.     Left hand: Normal. Normal strength. Normal capillary refill.     Cervical back: Normal range of motion and neck supple. No swelling, edema, deformity, erythema, signs of trauma, lacerations, rigidity, spasms, torticollis, tenderness or crepitus. No pain with movement, spinous process tenderness or muscular tenderness. Normal range of motion.     Thoracic back: No swelling, edema, deformity, signs of trauma, lacerations or spasms. Normal range of motion.     Right hip: Normal.     Left hip: Normal.  Right knee: Normal.     Left knee: Normal.  Lymphadenopathy:     Head:     Right side of head: No submental, submandibular, tonsillar, preauricular, posterior auricular or occipital adenopathy.     Left side of head: No submental, submandibular, tonsillar, preauricular, posterior auricular or occipital adenopathy.     Cervical: No cervical adenopathy.     Right cervical: No superficial, deep or posterior cervical adenopathy.    Left cervical: No superficial, deep or posterior cervical adenopathy.  Skin:    General: Skin is warm and dry.     Capillary Refill: Capillary refill takes less than 2 seconds.     Coloration: Skin is not ashen, cyanotic, jaundiced, mottled, pale or sallow.     Findings: No abrasion, abscess, acne, bruising, burn, ecchymosis, erythema, signs of injury, laceration, lesion, petechiae, rash or wound.     Nails: There is no clubbing.  Neurological:     General: No focal deficit present.     Mental  Status: She is alert and oriented to person, place, and time. Mental status is at baseline. She is not disoriented.     GCS: GCS eye subscore is 4. GCS verbal subscore is 5. GCS motor subscore is 6.     Cranial Nerves: Cranial nerves 2-12 are intact. No cranial nerve deficit, dysarthria or facial asymmetry.     Sensory: Sensation is intact. No sensory deficit.     Motor: Motor function is intact. No weakness, tremor, atrophy, abnormal muscle tone or seizure activity.     Coordination: Coordination is intact. Coordination normal.     Gait: Gait is intact. Gait normal.     Comments: On/off exam table and in/out of chair without difficulty; patient walking with holding right wrist in left hand to remind her to move torso and head all at same time; gait sure and steady; bilateral hand grasp equal 5/5  Psychiatric:        Attention and Perception: Attention and perception normal.        Mood and Affect: Mood and affect normal.        Speech: Speech normal.        Behavior: Behavior normal. Behavior is cooperative.        Thought Content: Thought content normal.        Cognition and Memory: Cognition and memory normal.        Judgment: Judgment normal.          Assessment & Plan:  A-eustachian tube dysfunction bilaterally, benign positional vertigo unspecified laterality, acute rhinitis, elevated blood pressure  P- No evidence of invasive bacterial infection, non toxic and well hydrated.  I do not see where any further testing or imaging is necessary at this time.   I will suggest supportive care, rest, good hygiene and encourage the patient to take adequate fluids.  The patient is to return to clinic or EMERGENCY ROOM if symptoms worsen or change significantly e.g. ear pain, fever, purulent discharge from ears or bleeding.  Exitcare handout on eustachian tube dysfunction.  Discussed with patient post nasal drip irritates throat/causes swelling blocks eustachian tubes from draining and fluid fills  up middle ear.  Bacteria/viruses can grow in fluid and with moving head tube compressed and increases pressure in tube/ear worsening pain.  Studies show will take 30 days for fluid to resolve after post nasal drip controlled with nasal steroid/antihistamine. Antibiotics and steroids do not speed up fluid removal.  Patient verbalized agreement and understanding of treatment  plan and had no further questions at this time.   Discussed with patient may continue flonase, nasal saline, antihistamine of choice and decongestant.  Otic effusion probably causing vertigo but could also be age. Meclizine 25mg  po prn helpful in the past but causing too much drowsiness.  Discussed could take after work/prior to bedtime and use decongestant for day use.  Discussed not driving during vertigo episodes for safety.  Discussed may cut dramamine tabs in half if full too strong/drowsy. Supportive treatment may take up to 4 doses meclizine per day max 100mg  per 24 hours. Discussed signs/symptoms stroke.  Someone to drive her home recommended not driving during vertigo episodes. Follow up if aphasia, dysphasia, visual changes, weakness, fall, worst headache of life, incoordination, fever, ear discharge. Consider ENT evaluation/follow up with PCM if worsening symptoms not controlled with meclizine.  Exitcare handout on vertigo.   Patient verbalized understanding of information/agreed with plan of care and had no further questions at this time.    Patient may use normal saline nasal spray 2 sprays each nostril q2h wa as needed given 1 bottle from clinic stock. flonase 75mcg 1 spray each nostril BID OTC.  Patient denied personal or family history of ENT cancer.  OTC antihistamine of choice claritin/zyrtec 10mg  po daily if not taking meclizine every 6 hours.  Avoid triggers if possible.  Shower prior to bedtime if exposed to triggers.  If allergic dust/dust mites recommend mattress/pillow covers/encasements; washing linens, vacuuming,  sweeping, dusting weekly.  Call or return to clinic as needed if these symptoms worsen or fail to improve as anticipated.   Exitcare handout on allergic rhinitis and nonallergic rhinitis.  Patient verbalized understanding of instructions, agreed with plan of care and had no further questions at this time.  P2:  Avoidance and hand washing.   Continue current medication spironolactone daily.  Patient reported had not taken every day when sick at home See [place] for repeat BP check tomorrow.  Avoid further caffeine this afternoon.  Consider low sodium/DASH diet and exercise program 150 minutes exercise/activity per week.  Recommended weight loss/weight maintenance to BMI 20-25. Return to the clinic if any new symptoms/notify clinic staff if visual changes, frequent headache, chest pain or dyspnea on mild or  minimal exertion. Exitcare handout on DASH diet and preventing hypertension.  Patient verbalized agreement and understanding of treatment plan and had no further questions at this time. P2: Diet and Exercise specific for HTN

## 2021-09-11 NOTE — Patient Instructions (Addendum)
Allergic Rhinitis, Adult Allergic rhinitis is an allergic reaction that affects the mucous membrane inside the nose. The mucous membrane is the tissue that produces mucus. There are two types of allergic rhinitis: Seasonal. This type is also called hay fever and happens only during certain seasons. Perennial. This type can happen at any time of the year. Allergic rhinitis cannot be spread from person to person. This condition can be mild, moderate, or severe. It can develop at any age and may be outgrown. What are the causes? This condition is caused by allergens. These are things that can cause an allergic reaction. Allergens may differ for seasonal allergic rhinitis and perennial allergic rhinitis. Seasonal allergic rhinitis is triggered by pollen. Pollen can come from grasses, trees, and weeds. Perennial allergic rhinitis may be triggered by: Dust mites. Proteins in a pet's urine, saliva, or dander. Dander is dead skin cells from a pet. Smoke, mold, or car fumes. What increases the risk? You are more likely to develop this condition if you have a family history of allergies or other conditions related to allergies, including: Allergic conjunctivitis. This is inflammation of parts of the eyes and eyelids. Asthma. This condition affects the lungs and makes it hard to breathe. Atopic dermatitis or eczema. This is long term (chronic) inflammation of the skin. Food allergies. What are the signs or symptoms? Symptoms of this condition include: Sneezing or coughing. A stuffy nose (nasal congestion), itchy nose, or nasal discharge. Itchy eyes and tearing of the eyes. A feeling of mucus dripping down the back of your throat (postnasal drip). Trouble sleeping. Tiredness or fatigue. Headache. Sore throat. How is this diagnosed? This condition may be diagnosed with your symptoms, medical history, and physical exam. Your health care provider may check for related conditions, such  as: Asthma. Pink eye. This is eye inflammation caused by infection (conjunctivitis). Ear infection. Upper respiratory infection. This is an infection in the nose, throat, or upper airways. You may also have tests to find out which allergens trigger your symptoms. These may include skin tests or blood tests. How is this treated? There is no cure for this condition, but treatment can help control symptoms. Treatment may include: Taking medicines that block allergy symptoms, such as corticosteroids and antihistamines. Medicine may be given as a shot, nasal spray, or pill. Avoiding any allergens. Being exposed again and again to tiny amounts of allergens to help you build a defense against allergens (immunotherapy). This is done if other treatments have not helped. It may include: Allergy shots. These are injected medicines that have small amounts of allergen in them. Sublingual immunotherapy. This involves taking small doses of a medicine with allergen in it under your tongue. If these treatments do not work, your health care provider may prescribe newer, stronger medicines. Follow these instructions at home: Avoiding allergens Find out what you are allergic to and avoid those allergens. These are some things you can do to help avoid allergens: If you have perennial allergies: Replace carpet with wood, tile, or vinyl flooring. Carpet can trap dander and dust. Do not smoke. Do not allow smoking in your home. Change your heating and air conditioning filters at least once a month. If you have seasonal allergies, take these steps during allergy season: Keep windows closed as much as possible. Plan outdoor activities when pollen counts are lowest. Check pollen counts before you plan outdoor activities. When coming indoors, change clothing and shower before sitting on furniture or bedding. If you have a pet in  the house that produces allergens: Keep the pet out of the bedroom. Vacuum, sweep, and  dust regularly. General instructions Take over-the-counter and prescription medicines only as told by your health care provider. Drink enough fluid to keep your urine pale yellow. Keep all follow-up visits as told by your health care provider. This is important. Where to find more information American Academy of Allergy, Asthma & Immunology: www.aaaai.org Contact a health care provider if: You have a fever. You develop a cough that does not go away. You make whistling sounds when you breathe (wheeze). Your symptoms slow you down or stop you from doing your normal activities each day. Get help right away if: You have shortness of breath. This symptom may represent a serious problem that is an emergency. Do not wait to see if the symptom will go away. Get medical help right away. Call your local emergency services (911 in the [place] Do not drive yourself to the hospital. Summary Allergic rhinitis may be managed by taking medicines as directed and avoiding allergens. If you have seasonal allergies, keep windows closed as much as possible during allergy season. Contact your health care provider if you develop a fever or a cough that does not go away. This information is not intended to replace advice given to you by your health care provider. Make sure you discuss any questions you have with your health care provider. Document Revised: 10/13/2019 Document Reviewed: 08/22/2019 Elsevier Patient Education  2022 [place]. Nonallergic Rhinitis Nonallergic rhinitis is inflammation of the mucous membrane inside the nose. The mucous membrane is the tissue that produces mucus. This condition is different from having allergic rhinitis, which is an allergy that affects the nose. Allergic rhinitis occurs when the body's defense system, or immune system, reacts to a substance that a person is allergic to (allergen), such as pollen, pet dander, mold, or dust. Nonallergic rhinitis has many similar symptoms,  but it is not caused by allergens. Nonallergic rhinitis can be an acute or chronic problem. This means it can be short-term or long-term. What are the causes? This condition may be caused by many different things. Some common types of nonallergic rhinitis include: Infectious rhinitis. This is usually caused by an infection in the nose, throat, or upper airways (upper respiratory system). Vasomotor rhinitis. This is the most common type of chronic nonallergic rhinitis. It is caused by too much blood flow through your nose, and it leads to swelling in your nose. It is triggered by strong odors, cold air, stress, drinking alcohol, cigarette smoke, or changes in the weather. Occupational rhinitis. This type is caused by triggers in the workplace, such as chemicals, dust, animal dander, or air pollution. Hormonal rhinitis, in teenage girls and women. This type is caused by an increase in the hormone estrogen and may happen during pregnancy, puberty, or monthly menstrual periods. Hormonal rhinitis gives you fewer symptoms when estrogen levels drop. Drug-induced rhinitis. Several types of medicines can cause this, such as medicines for high blood pressure or heart disease, aspirin, or NSAIDs. Nonallergic rhinitis with eosinophilia syndrome (NARES). This type is caused by having too much eosinophil, a type of white blood cell. Other causes include a reaction to eating hot or spicy foods. This does not usually cause long-term symptoms. In some cases, the cause of nonallergic rhinitis is not known. What increases the risk? You are more likely to develop this condition if: You are 62-54 years of age. You are a woman. Women are twice as likely to have  this condition. What are the signs or symptoms? Common symptoms of this condition include: Stuffy nose (nasal congestion). Runny nose. A feeling of mucus dripping down the back of your throat (postnasal drip). Trouble sleeping. Tiredness, or fatigue. Other  symptoms include: Sneezing. Coughing. Itchy nose. Bloodshot eyes. How is this diagnosed? This type may be diagnosed based on: Your symptoms and medical history. A physical exam. Allergy testing to rule out allergic rhinitis. You may have skin tests or blood tests. Your health care provider may also take a swab of nasal discharge to look for an increased number of eosinophils. This would be done to confirm a diagnosis of NARES. How is this treated? Treatment for this condition depends on the cause. No single treatment works for everyone. Work with your health care provider to find the best treatment for you. Treatment may include: Avoiding the things that trigger your symptoms. Medicines to relieve congestion, such as: Steroid nasal spray. There are many types. You may need to try a few to find out which one works best. Best boy medicine. This treats nasal congestion and may be given by mouth or as a nasal spray. These medicines are used only for a short time. Medicines to relieve a runny nose. These may include antihistamine medicines or anticholinergic nasal sprays. Nasal irrigation. This involves using a salt-water (saline) spray or saline container called a neti pot. Nasal irrigation helps to clear away mucus and keep your nasal passages moist. Surgery to remove part of your mucous membrane. This is done in severe cases if the condition has not improved after 6-12 months of treatment. Follow these instructions at home: Medicines Take or use over-the-counter and prescription medicines only as told by your health care provider. Do not stop using your medicine even if you start to feel better. Do not take NSAIDs, such as ibuprofen, or medicines that contain aspirin if they make your symptoms worse. Lifestyle Do not drink alcohol if it makes your symptoms worse. Do not use any products that contain nicotine or tobacco, such as cigarettes, e-cigarettes, and chewing tobacco. If you need  help quitting, ask your health care provider. Avoid secondhand smoke. General instructions Avoid triggers that make your symptoms worse. Use nasal irrigation as told by your health care provider. Get exercise. Exercise may help reduce symptoms for some people. Sleep with the head of your bed raised. This may reduce nasal congestion when you sleep. Drink enough fluid to keep your urine pale yellow. Keep all follow-up visits as told by your health care provider. This is important. Contact a health care provider if: You have a fever. Your symptoms are getting worse at home. Your symptoms do not lessen with medicine. You develop new symptoms, especially a headache or nosebleed. Summary Nonallergic rhinitis is inflammation inside the nose that is not caused by allergens. Nonallergic rhinitis can be a short-term or long-term problem. Treatment may include avoiding the things that trigger your symptoms. Take or use over-the-counter and prescription medicines only as told by your health care provider. Do not stop using your medicine even if you start to feel better. Contact a health care provider if your symptoms do not lessen with medicine. This information is not intended to replace advice given to you by your health care provider. Make sure you discuss any questions you have with your health care provider. Document Revised: 07/03/2019 Document Reviewed: 07/03/2019 Elsevier Patient Education  [place]. Eustachian Tube Dysfunction Eustachian tube dysfunction refers to a condition in which a blockage  develops in the narrow passage that connects the middle ear to the back of the nose (eustachian tube). The eustachian tube regulates air pressure in the middle ear by letting air move between the ear and nose. It also helps to drain fluid from the middle ear space. Eustachian tube dysfunction can affect one or both ears. When the eustachian tube does not function properly, air pressure, fluid,  or both can build up in the middle ear. What are the causes? This condition occurs when the eustachian tube becomes blocked or cannot open normally. Common causes of this condition include: Ear infections. Colds and other infections that affect the nose, mouth, and throat (upper respiratory tract). Allergies. Irritation from cigarette smoke. Irritation from stomach acid coming up into the esophagus (gastroesophageal reflux). The esophagus is the part of the body that moves food from the mouth to the stomach. Sudden changes in air pressure, such as from descending in an airplane or scuba diving. Abnormal growths in the nose or throat, such as: Growths that line the nose (nasal polyps). Abnormal growth of cells (tumors). Enlarged tissue at the back of the throat (adenoids). What increases the risk? You are more likely to develop this condition if: You smoke. You are overweight. You are a child who has: Certain birth defects of the mouth, such as cleft palate. Large tonsils or adenoids. What are the signs or symptoms? Common symptoms of this condition include: A feeling of fullness in the ear. Ear pain. Clicking or popping noises in the ear. Ringing in the ear (tinnitus). Hearing loss. Loss of balance. Dizziness. Symptoms may get worse when the air pressure around you changes, such as when you travel to an area of high elevation, fly on an airplane, or go scuba diving. How is this diagnosed? This condition may be diagnosed based on: Your symptoms. A physical exam of your ears, nose, and throat. Tests, such as those that measure: The movement of your eardrum. Your hearing (audiometry). How is this treated? Treatment depends on the cause and severity of your condition. In mild cases, you may relieve your symptoms by moving air into your ears. This is called "popping the ears." In more severe cases, or if you have symptoms of fluid in your ears, treatment may include: Medicines to  relieve congestion (decongestants). Medicines that treat allergies (antihistamines). Nasal sprays or ear drops that contain medicines that reduce swelling (steroids). A procedure to drain the fluid in your eardrum. In this procedure, a small tube may be placed in the eardrum to: Drain the fluid. Restore the air in the middle ear space. A procedure to insert a balloon device through the nose to inflate the opening of the eustachian tube (balloon dilation). Follow these instructions at home: Lifestyle Do not do any of the following until your health care provider approves: Travel to high altitudes. Fly in airplanes. Work in a Pension scheme manager or room. Scuba dive. Do not use any products that contain nicotine or tobacco. These products include cigarettes, chewing tobacco, and vaping devices, such as e-cigarettes. If you need help quitting, ask your health care provider. Keep your ears dry. Wear fitted earplugs during showering and bathing. Dry your ears completely after. General instructions Take over-the-counter and prescription medicines only as told by your health care provider. Use techniques to help pop your ears as recommended by your health care provider. These may include: Chewing gum. Yawning. Frequent, forceful swallowing. Closing your mouth, holding your nose closed, and gently blowing as if you are  trying to blow air out of your nose. Keep all follow-up visits. This is important. Contact a health care provider if: Your symptoms do not go away after treatment. Your symptoms come back after treatment. You are unable to pop your ears. You have: A fever. Pain in your ear. Pain in your head or neck. Fluid draining from your ear. Your hearing suddenly changes. You become very dizzy. You lose your balance. Get help right away if: You have a sudden, severe increase in any of your symptoms. Summary Eustachian tube dysfunction refers to a condition in which a blockage develops  in the eustachian tube. It can be caused by ear infections, allergies, inhaled irritants, or abnormal growths in the nose or throat. Symptoms may include ear pain or fullness, hearing loss, or ringing in the ears. Mild cases are treated with techniques to unblock the ears, such as yawning or chewing gum. More severe cases are treated with medicines or procedures. This information is not intended to replace advice given to you by your health care provider. Make sure you discuss any questions you have with your health care provider. Document Revised: 11/04/2020 Document Reviewed: 11/04/2020 Elsevier Patient Education  2022 [place]. Preventing Hypertension Hypertension, also called high blood pressure, is when the force of blood pumping through the arteries is too strong. Arteries are blood vessels that carry blood from the heart throughout the body. Often, hypertension does not cause symptoms until blood pressure is very high. It is important to have your blood pressure checked regularly. Diet and lifestyle changes can help you prevent hypertension, and they may make you feel better overall and improve your quality of life. If you already have hypertension, you may control it with diet and lifestyle changes, as well as with medicine. How can this condition affect me? Over time, hypertension can damage the arteries and decrease blood flow to important parts of the body, including the brain, heart, and kidneys. By keeping your blood pressure in a healthy range, you can help prevent complications like heart attack, heart failure, stroke, kidney failure, and vascular dementia. What can increase my risk? Being an older adult. Older people are more often affected. Having family members who have had high blood pressure. Being obese. Being female. Males are more likely to have high blood pressure. Drinking too much alcohol or caffeine. Smoking or using illegal drugs. Taking certain medicines, such as  antidepressants, decongestants, birth control pills, and NSAIDs, such as ibuprofen. Having thyroid problems. Having certain tumors. What actions can I take to prevent or manage this condition? Work with your health care provider to make a hypertension prevention plan that works for you. Follow your plan and keep all follow-up visits as told by your health care provider. Diet changes Maintain a healthy diet. This includes: Eating less salt (sodium). Ask your health care provider how much sodium is safe for you to have. The general recommendation is to have less than 1 tsp (2,300 mg) of sodium a day. Do not add salt to your food. Choose low-sodium options when grocery shopping and eating out. Limiting fats in your diet. You can do this by eating low-fat or fat-free dairy products and by eating less red meat. Eating more fruits, vegetables, and whole grains. Make a goal to eat: 1-2 cups of fresh fruits and vegetables each day. 3-4 servings of whole grains each day. Avoiding foods and beverages that have added sugars. Eating fish that contain healthy fats (omega-3 fatty acids), such as mackerel or salmon.  If you need help putting together a healthy eating plan, try the DASH diet. This diet is high in fruits, vegetables, and whole grains. It is low in sodium, red meat, and added sugars. DASH stands for Dietary Approaches to Stop Hypertension. Lifestyle changes Lose weight if you are overweight. Losing just 3?5% of your body weight can help prevent or control hypertension. For example, if your present weight is 200 lb (91 kg), a loss of 3-5% of your weight means losing 6-10 lb (2.7-4.5 kg). Ask your health care provider to help you with a diet and exercise plan to safely lose weight. Other recommendations usually include: Get enough exercise. Do at least 150 minutes of moderate-intensity exercise each week. You could do this in short exercise sessions several times a day, or you could do longer  exercise sessions a few times a week. For example, you could take a brisk 10-minute walk or bike ride, 3 times a day, for 5 days a week. Find ways to reduce stress, such as exercising, meditating, listening to music, or taking a yoga class. If you need help reducing stress, ask your health care provider. Do not use any products that contain nicotine or tobacco, such as cigarettes, e-cigarettes, and chewing tobacco. If you need help quitting, ask your health care provider. Chemicals in tobacco and nicotine products raise your blood pressure each time you use them. If you need help quitting, ask your health care provider. Learn how to check your blood pressure at home. Make sure that you know your personal target blood pressure, as told by your health care provider. Try to sleep 7-9 hours per night.  Alcohol use Do not drink alcohol if: Your health care provider tells you not to drink. You are pregnant, may be pregnant, or are planning to become pregnant. If you drink alcohol: Limit how much you use to: 0-1 drink a day for women. 0-2 drinks a day for men. Be aware of how much alcohol is in your drink. In the [place] one drink equals one 12 oz bottle of beer (355 mL), one 5 oz glass of wine (148 mL), or one 1 oz glass of hard liquor (44 mL). Medicines In addition to diet and lifestyle changes, your health care provider may recommend medicines to help lower your blood pressure. In general: You may need to try a few different medicines to find what works best for you. You may need to take more than one medicine. Take over-the-counter and prescription medicines only as told by your health care provider. Questions to ask your health care provider What is my blood pressure goal? How can I lower my risk for high blood pressure? How should I monitor my blood pressure at home? Where to find support Your health care provider can help you prevent hypertension and help you keep your blood pressure at a  healthy level. Your local hospital or your community may also provide support services and prevention programs. The American Heart Association offers an online support network at supportnetwork.heart.org Where to find more information Learn more about hypertension from: [place], Lung, and [place]: https://wilson-eaton.com/ Centers for Disease Control and Prevention: http://www.wolf.info/ American Academy of Family Physicians: familydoctor.org Learn more about the DASH diet from: [place], Lung, and [place]: https://wilson-eaton.com/ Contact a health care provider if: You think you are having a reaction to medicines you have taken. You have recurrent headaches or feel dizzy. You have swelling in your ankles. You have trouble with your vision. Get help  right away if: You have sudden, severe chest, back, or abdominal pain or discomfort. You have shortness of breath. You have a sudden, severe headache. These symptoms may represent a serious problem that is an emergency. Do not wait to see if the symptoms will go away. Get medical help right away. Call your local emergency services (911 in the [place] Do not drive yourself to the hospital.  Summary Hypertension often does not cause any symptoms until blood pressure is very high. It is important to get your blood pressure checked regularly. Diet and lifestyle changes are important steps in preventing hypertension. By keeping your blood pressure in a healthy range, you may prevent complications like heart attack, heart failure, stroke, and kidney failure. Work with your health care provider to make a hypertension prevention plan that works for you. This information is not intended to replace advice given to you by your health care provider. Make sure you discuss any questions you have with your health care provider. Document Revised: 07/25/2019 Document Reviewed: 07/25/2019 Elsevier Patient Education  2022 [place] Eating Plan DASH  stands for Dietary Approaches to Stop Hypertension. The DASH eating plan is a healthy eating plan that has been shown to: Reduce high blood pressure (hypertension). Reduce your risk for type 2 diabetes, heart disease, and stroke. Help with weight loss. What are tips for following this plan? Reading food labels Check food labels for the amount of salt (sodium) per serving. Choose foods with less than 5 percent of the Daily Value of sodium. Generally, foods with less than 300 milligrams (mg) of sodium per serving fit into this eating plan. To find whole grains, look for the word "whole" as the first word in the ingredient list. Shopping Buy products labeled as "low-sodium" or "no salt added." Buy fresh foods. Avoid canned foods and pre-made or frozen meals. Cooking Avoid adding salt when cooking. Use salt-free seasonings or herbs instead of table salt or sea salt. Check with your health care provider or pharmacist before using salt substitutes. Do not fry foods. Cook foods using healthy methods such as baking, boiling, grilling, roasting, and broiling instead. Cook with heart-healthy oils, such as olive, canola, avocado, soybean, or sunflower oil. Meal planning  Eat a balanced diet that includes: 4 or more servings of fruits and 4 or more servings of vegetables each day. Try to fill one-half of your plate with fruits and vegetables. 6-8 servings of whole grains each day. Less than 6 oz (170 g) of lean meat, poultry, or fish each day. A 3-oz (85-g) serving of meat is about the same size as a deck of cards. One egg equals 1 oz (28 g). 2-3 servings of low-fat dairy each day. One serving is 1 cup (237 mL). 1 serving of nuts, seeds, or beans 5 times each week. 2-3 servings of heart-healthy fats. Healthy fats called omega-3 fatty acids are found in foods such as walnuts, flaxseeds, fortified milks, and eggs. These fats are also found in cold-water fish, such as sardines, salmon, and mackerel. Limit  how much you eat of: Canned or prepackaged foods. Food that is high in trans fat, such as some fried foods. Food that is high in saturated fat, such as fatty meat. Desserts and other sweets, sugary drinks, and other foods with added sugar. Full-fat dairy products. Do not salt foods before eating. Do not eat more than 4 egg yolks a week. Try to eat at least 2 vegetarian meals a week. Eat more home-cooked food and  less restaurant, buffet, and fast food. Lifestyle When eating at a restaurant, ask that your food be prepared with less salt or no salt, if possible. If you drink alcohol: Limit how much you use to: 0-1 drink a day for women who are not pregnant. 0-2 drinks a day for men. Be aware of how much alcohol is in your drink. In the [place] one drink equals one 12 oz bottle of beer (355 mL), one 5 oz glass of wine (148 mL), or one 1 oz glass of hard liquor (44 mL). General information Avoid eating more than 2,300 mg of salt a day. If you have hypertension, you may need to reduce your sodium intake to 1,500 mg a day. Work with your health care provider to maintain a healthy body weight or to lose weight. Ask what an ideal weight is for you. Get at least 30 minutes of exercise that causes your heart to beat faster (aerobic exercise) most days of the week. Activities may include walking, swimming, or biking. Work with your health care provider or dietitian to adjust your eating plan to your individual calorie needs. What foods should I eat? Fruits All fresh, dried, or frozen fruit. Canned fruit in natural juice (without added sugar). Vegetables Fresh or frozen vegetables (raw, steamed, roasted, or grilled). Low-sodium or reduced-sodium tomato and vegetable juice. Low-sodium or reduced-sodium tomato sauce and tomato paste. Low-sodium or reduced-sodium canned vegetables. Grains Whole-grain or whole-wheat bread. Whole-grain or whole-wheat pasta. Brown rice. [place]. Bulgur. Whole-grain  and low-sodium cereals. Pita bread. Low-fat, low-sodium crackers. Whole-wheat flour tortillas. Meats and other proteins Skinless chicken or [place]. Ground chicken or [place]. Pork with fat trimmed off. Fish and seafood. Egg whites. Dried beans, peas, or lentils. Unsalted nuts, nut butters, and seeds. Unsalted canned beans. Lean cuts of beef with fat trimmed off. Low-sodium, lean precooked or cured meat, such as sausages or meat loaves. Dairy Low-fat (1%) or fat-free (skim) milk. Reduced-fat, low-fat, or fat-free cheeses. Nonfat, low-sodium ricotta or cottage cheese. Low-fat or nonfat yogurt. Low-fat, low-sodium cheese. Fats and oils Soft margarine without trans fats. Vegetable oil. Reduced-fat, low-fat, or light mayonnaise and salad dressings (reduced-sodium). Canola, safflower, olive, avocado, soybean, and sunflower oils. Avocado. Seasonings and condiments Herbs. Spices. Seasoning mixes without salt. Other foods Unsalted popcorn and pretzels. Fat-free sweets. The items listed above may not be a complete list of foods and beverages you can eat. Contact a dietitian for more information. What foods should I avoid? Fruits Canned fruit in a light or heavy syrup. Fried fruit. Fruit in cream or butter sauce. Vegetables Creamed or fried vegetables. Vegetables in a cheese sauce. Regular canned vegetables (not low-sodium or reduced-sodium). Regular canned tomato sauce and paste (not low-sodium or reduced-sodium). Regular tomato and vegetable juice (not low-sodium or reduced-sodium). Angie Fava. Olives. Grains Baked goods made with fat, such as croissants, muffins, or some breads. Dry pasta or rice meal packs. Meats and other proteins Fatty cuts of meat. Ribs. Fried meat. Berniece Salines. Bologna, salami, and other precooked or cured meats, such as sausages or meat loaves. Fat from the back of a pig (fatback). Bratwurst. Salted nuts and seeds. Canned beans with added salt. Canned or smoked fish. Whole eggs or egg yolks.  Chicken or [place] with skin. Dairy Whole or 2% milk, cream, and half-and-half. Whole or full-fat cream cheese. Whole-fat or sweetened yogurt. Full-fat cheese. Nondairy creamers. Whipped toppings. Processed cheese and cheese spreads. Fats and oils Butter. Stick margarine. Lard. Shortening. Ghee. Bacon fat. Tropical oils, such as  coconut, palm kernel, or palm oil. Seasonings and condiments Onion salt, garlic salt, seasoned salt, table salt, and sea salt. Worcestershire sauce. Tartar sauce. Barbecue sauce. Teriyaki sauce. Soy sauce, including reduced-sodium. Steak sauce. Canned and packaged gravies. Fish sauce. Oyster sauce. Cocktail sauce. Store-bought horseradish. Ketchup. Mustard. Meat flavorings and tenderizers. Bouillon cubes. Hot sauces. Pre-made or packaged marinades. Pre-made or packaged taco seasonings. Relishes. Regular salad dressings. Other foods Salted popcorn and pretzels. The items listed above may not be a complete list of foods and beverages you should avoid. Contact a dietitian for more information. Where to find more information National Heart, Lung, and Blood Institute: https://wilson-eaton.com/ American Heart Association: www.heart.org Academy of Nutrition and Dietetics: www.eatright.[place]: www.kidney.org Summary The DASH eating plan is a healthy eating plan that has been shown to reduce high blood pressure (hypertension). It may also reduce your risk for type 2 diabetes, heart disease, and stroke. When on the DASH eating plan, aim to eat more fresh fruits and vegetables, whole grains, lean proteins, low-fat dairy, and heart-healthy fats. With the DASH eating plan, you should limit salt (sodium) intake to 2,300 mg a day. If you have hypertension, you may need to reduce your sodium intake to 1,500 mg a day. Work with your health care provider or dietitian to adjust your eating plan to your individual calorie needs. This information is not intended to replace  advice given to you by your health care provider. Make sure you discuss any questions you have with your health care provider. Document Revised: 07/28/2019 Document Reviewed: 07/28/2019 Elsevier Patient Education  2022 [place]. Dizziness Dizziness is a common problem. It is a feeling of unsteadiness or light-headedness. You may feel like you are about to faint. Dizziness can lead to injury if you stumble or fall. Anyone can become dizzy, but dizziness is more common in older adults. This condition can be caused by a number of things, including medicines, dehydration, or illness. Follow these instructions at home: Eating and drinking  Drink enough fluid to keep your urine pale yellow. This helps to keep you from becoming dehydrated. Try to drink more clear fluids, such as water. Do not drink alcohol. Limit your caffeine intake if told to do so by your health care provider. Check ingredients and nutrition facts to see if a food or beverage contains caffeine. Limit your salt (sodium) intake if told to do so by your health care provider. Check ingredients and nutrition facts to see if a food or beverage contains sodium. Activity  Avoid making quick movements. Rise slowly from chairs and steady yourself until you feel okay. In the morning, first sit up on the side of the bed. When you feel okay, stand slowly while you hold onto something until you know that your balance is good. If you need to stand in one place for a long time, move your legs often. Tighten and relax the muscles in your legs while you are standing. Do not drive or use machinery if you feel dizzy. Avoid bending down if you feel dizzy. Place items in your home so that they are easy for you to reach without leaning over. Lifestyle Do not use any products that contain nicotine or tobacco. These products include cigarettes, chewing tobacco, and vaping devices, such as e-cigarettes. If you need help quitting, ask your health care  provider. Try to reduce your stress level by using methods such as yoga or meditation. Talk with your health care provider if you need help to  manage your stress. General instructions Watch your dizziness for any changes. Take over-the-counter and prescription medicines only as told by your health care provider. Talk with your health care provider if you think that your dizziness is caused by a medicine that you are taking. Tell a friend or a family member that you are feeling dizzy. If he or she notices any changes in your behavior, have this person call your health care provider. Keep all follow-up visits. This is important. Contact a health care provider if: Your dizziness does not go away or you have new symptoms. Your dizziness or light-headedness gets worse. You feel nauseous. You have reduced hearing. You have a fever. You have neck pain or a stiff neck. Your dizziness leads to an injury or a fall. Get help right away if: You vomit or have diarrhea and are unable to eat or drink anything. You have problems talking, walking, swallowing, or using your arms, hands, or legs. You feel generally weak. You have any bleeding. You are not thinking clearly or you have trouble forming sentences. It may take a friend or family member to notice this. You have chest pain, abdominal pain, shortness of breath, or sweating. Your vision changes or you develop a severe headache. These symptoms may represent a serious problem that is an emergency. Do not wait to see if the symptoms will go away. Get medical help right away. Call your local emergency services (911 in the [place] Do not drive yourself to the hospital. Summary Dizziness is a feeling of unsteadiness or light-headedness. This condition can be caused by a number of things, including medicines, dehydration, or illness. Anyone can become dizzy, but dizziness is more common in older adults. Drink enough fluid to keep your urine pale yellow. Do  not drink alcohol. Avoid making quick movements if you feel dizzy. Monitor your dizziness for any changes. This information is not intended to replace advice given to you by your health care provider. Make sure you discuss any questions you have with your health care provider. Document Revised: 07/29/2020 Document Reviewed: 07/29/2020 Elsevier Patient Education  2022 [place]. Benign Positional Vertigo Vertigo is the feeling that you or your surroundings are moving when they are not. Benign positional vertigo is the most common form of vertigo. This is usually a harmless condition (benign). This condition is positional. This means that symptoms are triggered by certain movements and positions. This condition can be dangerous if it occurs while you are doing something that could cause harm to yourself or others. This includes activities such as driving or operating machinery. What are the causes? The inner ear has fluid-filled canals that help your brain sense movement and balance. When the fluid moves, the brain receives messages about your body's position. With benign positional vertigo, calcium crystals in the inner ear break free and disturb the inner ear area. This causes your brain to receive confusing messages about your body's position. What increases the risk? You are more likely to develop this condition if: You are a woman. You are 58 years of age or older. You have recently had a head injury. You have an inner ear disease. What are the signs or symptoms? Symptoms of this condition usually happen when you move your head or your eyes in different directions. Symptoms may start suddenly and usually last for less than a minute. They include: Loss of balance and falling. Feeling like you are spinning or moving. Feeling like your surroundings are spinning or moving. Nausea and  vomiting. Blurred vision. Dizziness. Involuntary eye movement (nystagmus). Symptoms can be mild and cause  only minor problems, or they can be severe and interfere with daily life. Episodes of benign positional vertigo may return (recur) over time. Symptoms may also improve over time. How is this diagnosed? This condition may be diagnosed based on: Your medical history. A physical exam of the head, neck, and ears. Positional tests to check for or stimulate vertigo. You may be asked to turn your head and change positions, such as going from sitting to lying down. A health care provider will watch for symptoms of vertigo. You may be referred to a health care provider who specializes in ear, nose, and throat problems (ENT or otolaryngologist) or a provider who specializes in disorders of the nervous system (neurologist). How is this treated? This condition may be treated in a session in which your health care provider moves your head in specific positions to help the displaced crystals in your inner ear move. Treatment for this condition may take several sessions. Surgery may be needed in severe cases, but this is rare. In some cases, benign positional vertigo may resolve on its own in 2-4 weeks. Follow these instructions at home: Safety Move slowly. Avoid sudden body or head movements or certain positions, as told by your health care provider. Avoid driving or operating machinery until your health care provider says it is safe. Avoid doing any tasks that would be dangerous to you or others if vertigo occurs. If you have trouble walking or keeping your balance, try using a cane for stability. If you feel dizzy or unstable, sit down right away. Return to your normal activities as told by your health care provider. Ask your health care provider what activities are safe for you. General instructions Take over-the-counter and prescription medicines only as told by your health care provider. Drink enough fluid to keep your urine pale yellow. Keep all follow-up visits. This is important. Contact a health care  provider if: You have a fever. Your condition gets worse or you develop new symptoms. Your family or friends notice any behavioral changes. You have nausea or vomiting that gets worse. You have numbness or a prickling and tingling sensation. Get help right away if you: Have difficulty speaking or moving. Are always dizzy or faint. Develop severe headaches. Have weakness in your legs or arms. Have changes in your hearing or vision. Develop a stiff neck. Develop sensitivity to light. These symptoms may represent a serious problem that is an emergency. Do not wait to see if the symptoms will go away. Get medical help right away. Call your local emergency services (911 in the [place] Do not drive yourself to the hospital. Summary Vertigo is the feeling that you or your surroundings are moving when they are not. Benign positional vertigo is the most common form of vertigo. This condition is caused by calcium crystals in the inner ear that become displaced. This causes a disturbance in an area of the inner ear that helps your brain sense movement and balance. Symptoms include loss of balance and falling, feeling that you or your surroundings are moving, nausea and vomiting, and blurred vision. This condition can be diagnosed based on symptoms, a physical exam, and positional tests. Follow safety instructions as told by your health care provider and keep all follow-up visits. This is important. This information is not intended to replace advice given to you by your health care provider. Make sure you discuss any questions you have  with your health care provider. Document Revised: 07/24/2020 Document Reviewed: 07/24/2020 Elsevier Patient Education  2022 [employer].

## 2021-09-15 ENCOUNTER — Ambulatory Visit: Payer: Self-pay | Admitting: *Deleted

## 2021-09-15 ENCOUNTER — Other Ambulatory Visit: Payer: Self-pay

## 2021-09-15 VITALS — BP 127/103 | HR 78

## 2021-09-15 DIAGNOSIS — R030 Elevated blood-pressure reading, without diagnosis of hypertension: Secondary | ICD-10-CM

## 2021-09-15 NOTE — Progress Notes (Signed)
Repeat BP reading after elevated in clinic last week. Feeling much better today. Hadn't taken BP meds in several days while not feeling well recently. Restarted this weekend and has changed routine to taking them before leaving home for work as she was routinely missing doses previously. Trying to increase water intake as well. Plan to repeat BP Thursday or Friday this week.

## 2021-09-15 NOTE — Telephone Encounter (Addendum)
Patient seen in clinic by RN Rolly Salter today for BP check.  Patient restarted her blood pressure medication but reported has not been consistently taking daily.  Will follow up with patient 09/16/2021  BP 127/103 Important   (BP Location: Left Arm, Patient Position: Sitting) Pulse 78  Patient felt well denied concerns.  Patient sent [facility] handouts on managing hypertension and dash diet to my chart.

## 2021-09-17 NOTE — Telephone Encounter (Signed)
Patient reported vertigo resolved and has follow up blood pressure checks scheduled with RN Rolly Salter.  Had follow up with PCM this am but in hurry to not be late for her appt forgot to take her blood pressure medication again today.  Discussed keeping a supply at work and home so if she forgets at home can take at work.  Discussed to take missed dose when she remembers and not wait until the next day.  Patient reported work was stressful the other day prior to BP check also.  Patient denied questions or concerns.  A&OX3 spoke full sentences without difficulty gait sure and steady in her workcenter.  Respirations even and unlabored skin warm dry and pink.  Encouraged patient to keep scheduled appts with [facility] and RN Rolly Salter.  Patient verbalized understanding information/instructions, agreed with plan of care and had no further questions at this time.

## 2021-09-22 ENCOUNTER — Other Ambulatory Visit: Payer: Self-pay

## 2021-09-22 ENCOUNTER — Encounter: Payer: Self-pay | Admitting: Registered Nurse

## 2021-09-22 ENCOUNTER — Ambulatory Visit: Payer: Self-pay | Admitting: Registered Nurse

## 2021-09-22 VITALS — Resp 16

## 2021-09-22 DIAGNOSIS — M659 Synovitis and tenosynovitis, unspecified: Secondary | ICD-10-CM

## 2021-09-22 MED ORDER — ACETAMINOPHEN 500 MG PO TABS: 1000.0000 mg | ORAL_TABLET | Freq: Four times a day (QID) | ORAL | 0 refills | Status: AC | PRN

## 2021-09-22 NOTE — Progress Notes (Signed)
Subjective:    Patient ID: Olivia Hamilton, female    DOB: 1965/10/04, 56 y.o.   MRN: [id number]  55y/o caucasian female established patient here for evaluation right thumb pain.  Denied known injury, not improving with rest.  Denied bruising/swelling/rash/wound.  Works in [employer] job.  Had flexed to packing over the holidays, has dog (Orient) that walks on leash.     Review of Systems  Constitutional:  Positive for activity change. Negative for appetite change, chills, diaphoresis, fatigue and fever.  HENT:  Negative for trouble swallowing and voice change.   Eyes:  Negative for photophobia and visual disturbance.  Respiratory:  Negative for cough, shortness of breath, wheezing and stridor.   Cardiovascular:  Negative for chest pain.  Gastrointestinal:  Negative for diarrhea, nausea and vomiting.  Endocrine: Negative for cold intolerance and heat intolerance.  Genitourinary:  Negative for difficulty urinating.  Musculoskeletal:  Positive for myalgias. Negative for gait problem, neck pain and neck stiffness.  Skin:  Negative for color change, rash and wound.  Allergic/Immunologic: Positive for environmental allergies and food allergies.  Neurological:  Negative for dizziness, tremors, seizures, syncope, speech difficulty, weakness, light-headedness, numbness and headaches.  Hematological:  Negative for adenopathy. Does not bruise/bleed easily.  Psychiatric/Behavioral:  Negative for agitation, confusion and sleep disturbance.       Objective:   Physical Exam Vitals and nursing note reviewed.  Constitutional:      General: She is awake. She is not in acute distress.    Appearance: Normal appearance. She is well-developed and well-groomed. She is obese. She is not ill-appearing, toxic-appearing or diaphoretic.  HENT:     Head: Normocephalic and atraumatic.     Jaw: There is normal jaw occlusion.     Salivary Glands: Right salivary gland is not diffusely enlarged.  Left salivary gland is not diffusely enlarged.     Right Ear: Hearing and external ear normal.     Left Ear: Hearing and external ear normal.     Nose: Nose normal. No congestion or rhinorrhea.     Mouth/Throat:     Lips: Pink. No lesions.     Mouth: Mucous membranes are moist.     Pharynx: Oropharynx is clear.  Eyes:     General: Lids are normal. Vision grossly intact. Gaze aligned appropriately. Allergic shiner present. No scleral icterus.       Right eye: No discharge.        Left eye: No discharge.     Extraocular Movements: Extraocular movements intact.     Conjunctiva/sclera: Conjunctivae normal.     Pupils: Pupils are equal, round, and reactive to light.  Neck:     Trachea: Trachea and phonation normal. No tracheal deviation.  Cardiovascular:     Rate and Rhythm: Normal rate and regular rhythm.     Pulses: Normal pulses.          Radial pulses are 2+ on the right side and 2+ on the left side.  Pulmonary:     Effort: Pulmonary effort is normal. No respiratory distress.     Breath sounds: Normal breath sounds and air entry. No stridor or transmitted upper airway sounds. No wheezing.     Comments: Spoke full sentences without difficulty; no cough during exam Abdominal:     General: Abdomen is flat.  Musculoskeletal:        General: Tenderness present. No swelling, deformity or signs of injury.     Right elbow: No swelling, deformity,  effusion or lacerations. Normal range of motion.     Left elbow: No swelling, deformity, effusion or lacerations. Normal range of motion.     Right forearm: No swelling, edema, deformity, lacerations, tenderness or bony tenderness.     Left forearm: No swelling, edema, deformity, lacerations, tenderness or bony tenderness.     Right wrist: No swelling, deformity, effusion, lacerations, tenderness, bony tenderness, snuff box tenderness or crepitus. Decreased range of motion. Normal pulse.     Left wrist: No swelling, deformity, effusion, lacerations,  tenderness, bony tenderness, snuff box tenderness or crepitus. Decreased range of motion. Normal pulse.     Right hand: Tenderness present. No swelling, deformity, lacerations or bony tenderness. Normal range of motion. Normal strength. Normal sensation. There is no disruption of two-point discrimination. Normal capillary refill. Normal pulse.     Left hand: No swelling, deformity, lacerations, tenderness or bony tenderness. Normal range of motion. Normal strength. Normal sensation. There is no disruption of two-point discrimination. Normal capillary refill. Normal pulse.     Cervical back: Normal range of motion and neck supple. No edema, erythema, signs of trauma, rigidity, torticollis or crepitus. No pain with movement. Normal range of motion.     Right lower leg: No edema.     Left lower leg: No edema.     Comments: Decreased flexion and extension bilateral wrists 10 degrees; negative tinnels and phalens bilaterally; positive finkelstein's test right  Lymphadenopathy:     Head:     Right side of head: No submandibular or preauricular adenopathy.     Left side of head: No submandibular or preauricular adenopathy.     Cervical: No cervical adenopathy.     Right cervical: No superficial cervical adenopathy.    Left cervical: No superficial cervical adenopathy.  Skin:    General: Skin is warm and dry.     Capillary Refill: Capillary refill takes less than 2 seconds.     Coloration: Skin is not ashen, cyanotic, jaundiced, mottled, pale or sallow.     Findings: No abrasion, abscess, acne, bruising, burn, ecchymosis, erythema, signs of injury, laceration, lesion, petechiae, rash or wound.     Nails: There is no clubbing.  Neurological:     General: No focal deficit present.     Mental Status: She is alert and oriented to person, place, and time. Mental status is at baseline.     GCS: GCS eye subscore is 4. GCS verbal subscore is 5. GCS motor subscore is 6.     Cranial Nerves: No cranial nerve  deficit, dysarthria or facial asymmetry.     Sensory: Sensation is intact. No sensory deficit.     Motor: Motor function is intact. No weakness, tremor, atrophy, abnormal muscle tone or seizure activity.     Coordination: Coordination is intact. Coordination normal.     Gait: Gait is intact. Gait normal.     Comments: In/out of chair without difficulty; gait sure and steady in clinic; bilateral hand grasp equal 5/5  Psychiatric:        Attention and Perception: Attention and perception normal.        Mood and Affect: Mood and affect normal.        Speech: Speech normal.        Behavior: Behavior normal. Behavior is cooperative.        Thought Content: Thought content normal.        Cognition and Memory: Cognition and memory normal.        Judgment: Judgment  normal.          Assessment & Plan:  A-tenosynovitis right thumb initial visit  P-Patient to see RN Hildred Alamin for fitting and distribution thumb spica splint from clinic stock right.  Discussed may hand wash and air dry or use blowdryer but do not place in drying machine for clothes as can melt plastic.  Tylenol 1000mg  po q6h prn pain.  Due to hypertension avoid NSAIDS at this time.  Patient was instructed to rest, ice and elevate wrist/hand if swelling noted after work.  Cryotherapy 15 minutes TID prn pain/swelling. Exitcare handout on dequervains tenosynovitis, wrist sports rehab exercises. Patient to start wrist stretches/rehab 5 seconds hold week 1; 10 seconds week 2 and 15 seconds week 3.  Medications as directed.  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 Call or return to clinic as needed if these symptoms worsen  or fail to improve as anticipated and will consider PT and orthopedics evaluation.  Next follow up evaluation to be scheduled with RN Hildred Alamin in 2 weeks.  Discussed tendon inflamed and NSAID/ice help to improve.  Avoid starting new activities that  utilize hands in the next two weeks and then only 15 minutes per day after that time of rest. 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  Patient verbalized agreement and understanding of treatment plan and had no further questions at this time.. P2: ROM, injury prevention

## 2021-09-22 NOTE — Patient Instructions (Addendum)
Wrist Sprain Rehab Ask your health care provider which exercises are safe for you. Do exercises exactly as told by your health care provider and adjust them as directed. It is normal to feel mild stretching, pulling, tightness, or discomfort as you do these exercises. Stop right away if you feel sudden pain or your pain gets worse. Do not begin these exercises until told by your health care provider. Stretching and range-of-motion exercises These exercises warm up your muscles and joints and improve the movement and flexibility of your wrist. These exercises also help to relieve pain, numbness, and tingling. Wrist flexion range of motion Bend your left / right elbow to a 90-degree angle (right angle) with your palm facing the floor. Bend your wrist forward so your fingers point toward the floor (flexion). Hold this position for ____5-15______ seconds. Slowly return to the starting position. Repeat ____3______ times. Complete this exercise ____2______ times a day. Wrist extension range of motion Bend your left / right elbow to a 90-degree angle (right angle) with your palm facing the floor. Bend your wrist backward so your fingers point toward the ceiling (extension). Hold this position for ___5-15_______ seconds. Slowly return to the starting position. Repeat ___3_______ times. Complete this exercise ____2______ times a day. Ulnar deviation range of motion Bend your left / right elbow to a 90-degree angle (right angle), and rest your forearm on a table with your palm facing down. Keeping your hand flat on the table, bend your left / right wrist toward your small finger (pinkie). This is ulnar deviation. Hold this position for ___5-15_______ seconds. Slowly return your palm to the starting position. Repeat ___3_______ times. Complete this exercise ____2______ times a day. Radial deviation range of motion Bend your left / right elbow to a 90-degree angle (right angle), and rest your forearm on a  table with your palm facing down. Keeping your hand flat on the table, bend your left / right wrist toward your thumb. This is radial deviation. Hold this position for ___5-15_______ seconds. Slowly return your palm to the starting position. Repeat ___3_______ times. Complete this exercise ______2____ times a day. Dart-thrower's motion This exercise combines all four wrist motions--wrist flexion, wrist extension, ulnar deviation, and radial deviation--into one exercise. The motion is similar to throwing a dart. Sit and rest your left / right elbow on a table. Start with your wrist straight. Keep your fingers relaxed during the exercise. In the same motion, bend your wrist backward (extension) and toward your thumb (radial deviation). Move slowly and go as far as you can. Then, in the same motion, bend your wrist forward (flexion) and toward your small (pinkie) finger (ulnar deviation). Move slowly and go as far as you can. Slowly return to the starting position. Repeat ____3______ times. Complete this exercise _____2_____ times a day. Wrist flexion stretch  Extend your left / right arm in front of you, and turn your palm down toward the floor. If told by your health care provider, bend your left / right elbow to a 90-degree angle (right angle) at your side. Using your uninjured hand, gently press over the back of your left / right hand to bend your wrist and fingers toward the floor (flexion). Go as far as you can to feel a stretch without causing pain. Hold this position for ____5-15______ seconds. Slowly return to the starting position. Repeat ____3______ times. Complete this exercise ______2____ times a day. Wrist extension stretch  Extend your left / right arm in front of you and  turn your palm up toward the ceiling. If told by your health care provider, bend your left / right elbow to a 90-degree angle (right angle) at your side. Using your uninjured hand, gently press over the palm of  your left / right hand to bend your wrist and fingers toward the floor (extension). Go as far as you can to feel a stretch without causing pain. Hold this position for _____5-15_____ seconds. Slowly return to the starting position. Repeat _____3_____ times. Complete this exercise _____2_____ times a day. Ulnar deviation stretch Bend your left / right elbow to a 90-degree angle (right angle). Rest your forearm, wrist, and hand on a table with your palm facing the floor. Place your uninjured hand over the top of your left / right hand to keep it from moving during the exercise. Gently move your left / right elbow away from your body. This will turn your wrist toward your small finger (pinkie). This is ulnar deviation. Hold this position for ____5-15______ seconds. Slowly return to the starting position. Repeat ____3______ times. Complete this exercise ___2_______ times a day. Radial deviation stretch Bend your left / right elbow to a 90-degree angle (right angle). Rest your forearm, wrist, and hand on a table with your palm facing the floor. Place your uninjured hand over the top of your left / right hand to keep it from moving during the exercise. Gently move your left / right elbow toward your body. This will turn your wrist toward your thumb (radial deviation). Hold this position for __5-15________ seconds. Slowly return to the starting position. Repeat ____3______ times. Complete this exercise ____2______ times a day. Strengthening exercises These exercises build strength and endurance in your wrist. Endurance is the ability to use your muscles for a long time, even after they get tired. The first 4 exercises are isometric, which means creating tension in a muscle or group of muscles without moving the joint. Isometric wrist flexion Bend your left / right elbow to a 90-degree angle (right angle). Rest your forearm, wrist, and hand on a table with your palm facing the floor. Without moving any  joints (isometric), tighten your muscles as if you are trying to bend (flex) your wrist. Start with gentle effort and increase your effort as tolerated. Hold this position for ___5-15_______ seconds. Then relax your muscles. Repeat ___3_______ times. Complete this exercise ____2______ times a day. Isometric wrist extension Bend your left / right elbow to a 90-degree angle (right angle). Rest your forearm, wrist, and hand on a table with your palm facing the floor. Place your unaffected hand over the top of the left / right hand. Without moving any joints (isometric), tighten your muscles to lift your left / right hand off the table (extension). Use the unaffected hand to keep the left / right hand on the table. Start with gentle effort and increase your effort as tolerated. Hold this position for ___5-15_______ seconds. Then relax your muscles. Repeat ___3_______ times. Complete this exercise _____2_____ times a day. Isometric ulnar deviation Bend your left / right elbow to a 90-degree angle (right angle). Rest your forearm, wrist, and hand on a table with your thumb facing the ceiling (neutral position). Make a light fist with your left / right fingers. Without moving any joints (isometric), tighten your muscles to press the side of your left / right fist into the tabletop (ulnar deviation). Start with gentle effort and increase your effort as tolerated. Hold this position for ____5-15______ seconds. Then relax your muscles.  Repeat ____3______ times. Complete this exercise ______2____ times a day. Isometric radial deviation Bend your left / right elbow to a 90-degree angle (right angle). Rest your forearm, wrist, and hand on a table with your thumb facing the ceiling (neutral position). Make a light fist with your left / right fingers. Place your unaffected hand over the top of your fist. Without moving any joints (isometric), tighten your muscles to lift your left / right fist off the table  (radial deviation). Use the unaffected hand to keep the left / right hand on the table. Start with gentle effort and increase your effort as tolerated. Hold this position for _____5-15_____ seconds. Then relax your muscles. Repeat _____3_____ times. Complete this exercise ______2____ times a day. Wrist flexion  Sit with your left / right forearm supported on a table or other surface. Bend your elbow to a 90-degree angle (right angle), and rest your hand palm-up over the edge of the table. Hold a __none________ weight in your left / right hand. Or, hold an exercise band or tube in both hands, keeping your hands at the same level and hip distance apart. There should be a slight tension in the exercise band or tube. Slowly curl your hand up toward the ceiling (flexion). Hold this position for _____5-15_____ seconds. Slowly lower your hand back to the starting position. Repeat _____3_____ times. Complete this exercise ______2____ times a day. Wrist extension  Sit with your left / right forearm supported on a table or other surface. Bend your elbow to a 90-degree angle (right angle), and rest your hand palm-down over the edge of the table. Hold a __none________ weight in your left / right hand. Or, hold an exercise band or tube in both hands, keeping your hands at the same level and hip distance apart. There should be a slight tension in the exercise band or tube. Slowly curl your hand up toward the ceiling (extension). Hold this position for ____5-15______ seconds. Slowly lower your hand back to the starting position. Repeat ____3______ times. Complete this exercise ____2______ times a day. This information is not intended to replace advice given to you by your health care provider. Make sure you discuss any questions you have with your health care provider. Document Revised: 01/04/2020 Document Reviewed: 01/04/2020 Elsevier Patient Education  [place] Tenosynovitis De  Quervain's tenosynovitis is a condition that causes inflammation of the tendon on the thumb side of the wrist. Tendons are cords of tissue that connect bones to muscles. The tendons in the hand pass through a tunnel called a sheath. A slippery layer of tissue (synovium) lets the tendons move smoothly in the sheath. With de Quervain's tenosynovitis, the sheath swells or thickens, causing friction and pain. The condition is also called de Quervain's disease and de Quervain's syndrome. It occurs most often in women who are 35-61 years old. What are the causes? The exact cause of this condition is not known. It may be associated with overuse of the hand and wrist. What increases the risk? You are more likely to develop this condition if you: Use your hands far more than normal, especially if you repeat certain movements that involve twisting your hand or using a tight grip. Are pregnant. Are a middle-aged woman. Have rheumatoid arthritis. Have diabetes. What are the signs or symptoms? The main symptom of this condition is pain on the thumb side of the wrist. The pain may get worse when you grasp something or turn your wrist. Other symptoms  may include: Pain that extends up the forearm. Swelling of your wrist and hand. Trouble moving the thumb and wrist. A sensation of snapping in the wrist. A bump filled with fluid (cyst) in the area of the pain. How is this diagnosed? This condition may be diagnosed based on: Your symptoms and medical history. A physical exam. During the exam, your health care provider may do a simple test Wynn Maudlin test) that involves pulling your thumb and wrist to see if this causes pain. You may also need to have an X-ray or ultrasound. How is this treated? Treatment for this condition may include: Avoiding any activity that causes pain and swelling. Taking medicines. Anti-inflammatory medicines and corticosteroid injections may be used to reduce inflammation and  relieve pain. Wearing a splint. Having surgery. This may be needed if other treatments do not work. Once the pain and swelling have gone down, you may start: Physical therapy. This includes exercises to improve movement and strength in your wrist and thumb. Occupational therapy. This includes adjusting how you move your wrist. Follow these instructions at home: If you have a splint: Wear the splint as told by your health care provider. Remove it only as told by your health care provider. Loosen the splint if your fingers tingle, become numb, or turn cold and blue. Keep the splint clean. If the splint is not waterproof: Do not let it get wet. Cover it with a watertight covering when you take a bath or a shower. Managing pain, stiffness, and swelling  Avoid movements and activities that cause pain and swelling in the wrist area. If directed, put ice on the painful area. This may be helpful after doing activities that involve the sore wrist. To do this: Put ice in a plastic bag. Place a towel between your skin and the bag. Leave the ice on for 20 minutes, 2-3 times a day. Remove the ice if your skin turns bright red. This is very important. If you cannot feel pain, heat, or cold, you have a greater risk of damage to the area. Move your fingers often to reduce stiffness and swelling. Raise (elevate) the injured area above the level of your heart while you are sitting or lying down. General instructions Return to your normal activities as told by your health care provider. Ask your health care provider what activities are safe for you. Take over-the-counter and prescription medicines only as told by your health care provider. Keep all follow-up visits. This is important. Contact a health care provider if: Your pain medicine does not help. Your pain gets worse. You develop new symptoms. Summary De Quervain's tenosynovitis is a condition that causes inflammation of the tendon on the thumb  side of the wrist. The condition occurs most often in women who are 4-75 years old. The exact cause of this condition is not known. It may be associated with overuse of the hand and wrist. Treatment starts with avoiding activity that causes pain or swelling in the wrist area. Other treatments may include wearing a splint and taking medicine. Sometimes, surgery is needed. This information is not intended to replace advice given to you by your health care provider. Make sure you discuss any questions you have with your health care provider. Document Revised: 12/06/2019 Document Reviewed: 12/06/2019 Elsevier Patient Education  2022 [employer].

## 2021-09-23 NOTE — Telephone Encounter (Signed)
Last BP on file 127/103.  Message left for patient and RN Rolly Salter to repeat BP check this week and verify if patient had missed any medication doses.

## 2021-09-24 ENCOUNTER — Other Ambulatory Visit: Payer: Self-pay | Admitting: Obstetrics and Gynecology

## 2021-09-24 DIAGNOSIS — Z1231 Encounter for screening mammogram for malignant neoplasm of breast: Secondary | ICD-10-CM

## 2021-09-24 NOTE — Telephone Encounter (Signed)
Patient reported had been unable to get repeat BP  on 1/17 with RN Rolly Salter and thumb spica splint fitted and off work 1/19 and 1/20.  Will follow up with RN Rolly Salter 1/23.  Clinic closed 1/18, 1/21 and 1/22 (clinic closed every week Wed, Sat, Sun).

## 2021-09-30 ENCOUNTER — Other Ambulatory Visit: Payer: Self-pay

## 2021-09-30 ENCOUNTER — Ambulatory Visit
Admission: RE | Admit: 2021-09-30 | Discharge: 2021-09-30 | Disposition: A | Discharge status: Home / Self Care | Payer: No Typology Code available for payment source | Source: Ambulatory Visit | Attending: Obstetrics and Gynecology | Admitting: Obstetrics and Gynecology

## 2021-09-30 DIAGNOSIS — Z1231 Encounter for screening mammogram for malignant neoplasm of breast: Secondary | ICD-10-CM

## 2021-09-30 LAB — HM MAMMOGRAPHY

## 2021-10-02 ENCOUNTER — Other Ambulatory Visit: Payer: Self-pay

## 2021-10-02 ENCOUNTER — Ambulatory Visit: Payer: Self-pay | Admitting: *Deleted

## 2021-10-02 VITALS — BP 129/111 | HR 82

## 2021-10-02 DIAGNOSIS — I10 Essential (primary) hypertension: Secondary | ICD-10-CM

## 2021-10-02 NOTE — Progress Notes (Signed)
BP f/u. Has not taken spironolactone in 2 days. Going back to office now to take today's dose. Will RTC at 1530 today to recheck. Denies any HTN sx.

## 2021-10-10 ENCOUNTER — Other Ambulatory Visit: Payer: Self-pay

## 2021-10-10 ENCOUNTER — Ambulatory Visit: Payer: Self-pay | Admitting: *Deleted

## 2021-10-10 VITALS — BP 166/104

## 2021-10-10 DIAGNOSIS — I10 Essential (primary) hypertension: Secondary | ICD-10-CM

## 2021-10-10 NOTE — Progress Notes (Signed)
Does not feel spironolactone is working well for BP management. Side effects she attributes to it include breast tenderness, diarrhea. BP more elevated today than recently. Has taken med today. Ready to try a different medication. She is going contact pcp at new [place] location to see when she can be seen for HTN management. If not seen within a couple of weeks, will make appt with NP Inetta Fermo for initial management until pcp appt.  Pt denies any HTN sx. Advised pt to avoid sodium and caffeine intake this weekend and increase water intake. Pt verbalizes understanding.

## 2021-10-11 ENCOUNTER — Encounter: Payer: Self-pay | Admitting: Registered Nurse

## 2021-10-11 ENCOUNTER — Telehealth: Payer: Self-pay | Admitting: Registered Nurse

## 2021-10-11 DIAGNOSIS — I10 Essential (primary) hypertension: Secondary | ICD-10-CM

## 2021-10-11 MED ORDER — SPIRONOLACTONE 25 MG PO TABS: 25.0000 mg | ORAL_TABLET | Freq: Two times a day (BID) | ORAL | 0 refills | Status: DC

## 2021-10-11 NOTE — Telephone Encounter (Signed)
Patient stated last 2 blood pressure checks with RN Rolly Salter elevated despite her taking her BP meds.   Next available appt with her [facility].  Patient would like to discuss other blood pressure medication options.  Discussed with patient NP next in clinic Tuesday 7 Feb will see patient to discuss options.  This weekend patient to increase spironolactone 25mg  to BID dosing from daily dosing.  Reviewed RN Epic note and BP yesterday 166/104 with RN Rolly Salter.  Agreed with RN Rolly Salter plan of care to decrease sodium and caffeine intake.  DASH diet exitcare handout sent to patient today along with managing hypertension.  Patient to continue weight loss efforts.  Patient also experiencing breast tenderness and diarrhea when she takes spironolactone per RN Rolly Salter note.  Message left for patient regarding spironolactone dosing at 0820 today.  Reviewed up to date and half life medication approximately 12 hours.  Dosing 25-100mg  for hypertension.

## 2021-10-14 ENCOUNTER — Ambulatory Visit: Payer: Self-pay | Admitting: Registered Nurse

## 2021-10-14 ENCOUNTER — Other Ambulatory Visit: Payer: Self-pay

## 2021-10-14 VITALS — BP 116/102 | HR 74 | Temp 98.2°F

## 2021-10-14 DIAGNOSIS — R600 Localized edema: Secondary | ICD-10-CM

## 2021-10-14 DIAGNOSIS — I10 Essential (primary) hypertension: Secondary | ICD-10-CM

## 2021-10-14 MED ORDER — HYDROCHLOROTHIAZIDE 12.5 MG PO TABS: 12.5000 mg | ORAL_TABLET | Freq: Every day | ORAL | 0 refills | Status: DC

## 2021-10-14 NOTE — Progress Notes (Signed)
Subjective:    Patient ID: Olivia Hamilton, female    DOB: 04/24/1966, 56 y.o.   MRN: [id number]  55y/o Caucasian single established female pt presenting for f/u HTN. Wants to discuss trying a different BP medication for more effectiveness as spironolactone doesn't seem to be working.  Unable to get appt with [facility] for a couple months NP Baity.  Has varicose veins and leg swelling.  Grandmother had to take lasix and have vein stripping performed.  Patient restarted walking program this weekend and decreasing salt intake.  Tried taking spironolactone 25mg  po BID this weekend but gums became very sensitive and she cannot brush her teeth.  Also has nipple soreness, increased anxiety.  She would like to stop spironolactone. Denies any chest pain, palpitations, ShOB, vision changes, HA, or other HTN symptoms.  Patient still grieving her mother's death and that has hindered her weight loss efforts.  Patient reported this is the longest she has ever been on spironolactone typically she is able to lose weight.     Review of Systems  Constitutional:  Positive for fatigue. Negative for activity change, appetite change, chills and diaphoresis.  HENT:  Positive for dental problem. Negative for congestion, trouble swallowing and voice change.   Eyes:  Negative for photophobia and visual disturbance.  Respiratory:  Negative for cough, shortness of breath, wheezing and stridor.   Cardiovascular:  Positive for leg swelling. Negative for chest pain and palpitations.  Gastrointestinal:  Negative for diarrhea, nausea and vomiting.  Endocrine: Negative for cold intolerance and heat intolerance.  Genitourinary:  Negative for difficulty urinating.  Musculoskeletal:  Negative for gait problem, neck pain and neck stiffness.  Skin:  Negative for rash.  Allergic/Immunologic: Positive for environmental allergies and food allergies.  Neurological:  Negative for dizziness, tremors, syncope, speech difficulty, weakness,  light-headedness and headaches.  Hematological:  Negative for adenopathy. Does not bruise/bleed easily.  Psychiatric/Behavioral:  Negative for agitation, confusion and sleep disturbance. The patient is nervous/anxious.       Objective:   Physical Exam Vitals and nursing note reviewed.  Constitutional:      General: She is awake. She is not in acute distress.    Appearance: Normal appearance. She is well-developed and well-groomed. She is obese. She is not ill-appearing, toxic-appearing or diaphoretic.  HENT:     Head: Normocephalic and atraumatic.     Jaw: There is normal jaw occlusion.     Salivary Glands: Right salivary gland is not diffusely enlarged. Left salivary gland is not diffusely enlarged.     Right Ear: Hearing and external ear normal.     Left Ear: Hearing and external ear normal.     Nose: Nose normal. No congestion or rhinorrhea.     Mouth/Throat:     Lips: Pink. No lesions.     Mouth: Mucous membranes are moist.     Pharynx: Oropharynx is clear.  Eyes:     General: Lids are normal. Vision grossly intact. Gaze aligned appropriately. Allergic shiner present. No scleral icterus.       Right eye: No discharge.        Left eye: No discharge.     Extraocular Movements: Extraocular movements intact.     Conjunctiva/sclera: Conjunctivae normal.     Pupils: Pupils are equal, round, and reactive to light.  Neck:     Trachea: Trachea and phonation normal. No tracheal deviation.  Cardiovascular:     Rate and Rhythm: Normal rate and regular rhythm.  Pulses:          Radial pulses are 2+ on the right side and 2+ on the left side.     Heart sounds: Normal heart sounds, S1 normal and S2 normal.  Pulmonary:     Effort: Pulmonary effort is normal. No respiratory distress.     Breath sounds: Normal breath sounds and air entry. No stridor or transmitted upper airway sounds. No wheezing, rhonchi or rales.     Comments: Spoke full sentences without difficulty; no cough observed in  exam room Abdominal:     General: Abdomen is flat.  Musculoskeletal:        General: No swelling, tenderness or signs of injury. Normal range of motion.     Cervical back: Normal range of motion and neck supple. No edema, erythema, rigidity or crepitus. Normal range of motion.     Right lower leg: 1+ Edema present.     Left lower leg: 1+ Edema present.  Lymphadenopathy:     Head:     Right side of head: No submandibular or preauricular adenopathy.     Left side of head: No submandibular or preauricular adenopathy.     Cervical: No cervical adenopathy.     Right cervical: No superficial cervical adenopathy.    Left cervical: No superficial cervical adenopathy.  Skin:    General: Skin is warm and dry.     Capillary Refill: Capillary refill takes less than 2 seconds.     Coloration: Skin is not ashen, cyanotic, jaundiced, mottled, pale or sallow.     Findings: No abrasion, abscess, acne, bruising, burn, ecchymosis, erythema, signs of injury, laceration, lesion, petechiae, rash or wound.     Nails: There is no clubbing.  Neurological:     General: No focal deficit present.     Mental Status: She is alert and oriented to person, place, and time. Mental status is at baseline.     GCS: GCS eye subscore is 4. GCS verbal subscore is 5. GCS motor subscore is 6.     Cranial Nerves: Cranial nerves 2-12 are intact. No cranial nerve deficit, dysarthria or facial asymmetry.     Sensory: Sensation is intact.     Motor: Motor function is intact. No weakness, tremor, atrophy, abnormal muscle tone or seizure activity.     Coordination: Coordination is intact. Coordination normal.     Gait: Gait is intact. Gait normal.     Comments: In/out of chair without difficulty; gait sure and steady in clinic; bilateral hand grasp equal 5/5  Psychiatric:        Attention and Perception: Attention and perception normal.        Mood and Affect: Mood and affect normal.        Speech: Speech normal.        Behavior:  Behavior normal. Behavior is cooperative.        Thought Content: Thought content normal.        Cognition and Memory: Cognition and memory normal.        Judgment: Judgment normal.          Assessment & Plan:   A-essential hypertension, leg swelling bilateral, obesity  P-Discussed lisinopril, amlodipine, and hydrochlorothiazide.  Patient would like to decrease leg swelling and understanding hydrochlorothiazide will increase her urination.  Discussed to take in am and will start with 12.5mg  po daily #90 RF0 dispensed from [facility] to patient today.  Discussed will start at low dose and titrate up if not at  goal over the next month.  Discussed with patient I would like her to continue spironolactone until we see her blood pressure decrease under 140/90 on hydrochlorothiazide. Goal BP at least 120/70s.  Discussed increased diastolic pressure over [id number] increases her risk of stroke and leg swelling.  Patient took first dose in clinic today and will follow up with RN Hildred Alamin this afternoon for repeat BP check prior to clinic closing.  Discussed peak effect typically 4 hours and half life 12 hours.  Discussed no tapering required with spironolactone per Up to date.  Discussed compression socks or leggings also for varicose veins.  Continue to monitor blood pressure at home and maintain log of blood pressure and pulse to bring to follow up appointments. Continue low sodium/DASH diet and exercise program 150 minutes exercise/activity per week.  Recommended weight loss/weight maintenance to BMI 20-25. Return to the clinic if any new symptoms/notify clinic staff if visual changes, frequent headache, chest pain or dyspnea on mild or  minimal exertion. Exitcare handout on managing hypertension. Discussed will check BMET in 1 month due to possibility of low potassium with increased urination.  Exitcare handout on leg swelling. Patient verbalized agreement and understanding of treatment plan and had no further questions at  this time. P2: Diet and Exercise specific for HTN

## 2021-10-14 NOTE — Telephone Encounter (Signed)
Patient seen in clinic today and started on hydrochlorothiazide 12.5mg  po daily and to have follow up BP check with RN Rolly Salter prior to clinic closure today.  Message left for RN Rolly Salter to verify if patient returned for BP check.

## 2021-10-14 NOTE — Patient Instructions (Signed)
Edema °Edema is an abnormal buildup of fluids in the body tissues and under the skin. Swelling of the legs, feet, and ankles is a common symptom that becomes more likely as you get older. Swelling is also common in looser tissues, like around the eyes. When the affected area is squeezed, the fluid may move out of that spot and leave a dent for a few moments. This dent is called pitting edema. °There are many possible causes of edema. Eating too much salt (sodium) and being on your feet or sitting for a long time can cause edema in your legs, feet, and ankles. Hot weather may make edema worse. Common causes of edema include: °Heart failure. °Liver or kidney disease. °Weak leg blood vessels. °Cancer. °An injury. °Pregnancy. °Medicines. °Being obese. °Low protein levels in the blood. °Edema is usually painless. Your skin may look swollen or shiny. °Follow these instructions at home: °Keep the affected body part raised (elevated) above the level of your heart when you are sitting or lying down. °Do not sit still or stand for long periods of time. °Do not wear tight clothing. Do not wear garters on your upper legs. °Exercise your legs to get your circulation going. This helps to move the fluid back into your blood vessels, and it may help the swelling go down. °Wear elastic bandages or support stockings to reduce swelling as told by your health care provider. °Eat a low-salt (low-sodium) diet to reduce fluid as told by your health care provider. °Depending on the cause of your swelling, you may need to limit how much fluid you drink (fluid restriction). °Take over-the-counter and prescription medicines only as told by your health care provider. °Contact a health care provider if: °Your edema does not get better with treatment. °You have heart, liver, or kidney disease and have symptoms of edema. °You have sudden and unexplained weight gain. °Get help right away if: °You develop shortness of breath or chest pain. °You  cannot breathe when you lie down. °You develop pain, redness, or warmth in the swollen areas. °You have heart, liver, or kidney disease and suddenly get edema. °You have a fever and your symptoms suddenly get worse. °Summary °Edema is an abnormal buildup of fluids in the body tissues and under the skin. °Eating too much salt (sodium) and being on your feet or sitting for a long time can cause edema in your legs, feet, and ankles. °Keep the affected body part raised (elevated) above the level of your heart when you are sitting or lying down. °This information is not intended to replace advice given to you by your health care provider. Make sure you discuss any questions you have with your health care provider. °Document Revised: 01/23/2021 Document Reviewed: 06/18/2020 °Elsevier Patient Education © 2022 [employer] Inc. ° °

## 2021-10-16 ENCOUNTER — Other Ambulatory Visit: Payer: Self-pay

## 2021-10-16 ENCOUNTER — Ambulatory Visit: Payer: Self-pay | Admitting: *Deleted

## 2021-10-16 VITALS — BP 139/115 | HR 86

## 2021-10-16 DIAGNOSIS — I10 Essential (primary) hypertension: Secondary | ICD-10-CM

## 2021-10-16 NOTE — Telephone Encounter (Signed)
Patient came to clinic having forgotten her hydrochlorothiazide 12.5mg  at home.  Wondering if another bottle can be dispensed from [facility] to keep at work.  #30 RF0 dispensed to patient and she is to return for BP this afternoon with RN Rolly Salter.  BP elevated this am 139/115 HR 86--she did take 1 spironolactone she had in her purse this am at work.  Patient denied chest pain/dyspnea.  Mild headache.  Had stressful meeting before coming to clinic.  Patient reiterated she is feeling better since starting hydrochlorothiazide and noted yesterday she has lost 4lbs on home scale and legs not seeming as swollen at end of day after work.  Patient A&Ox3 spoke full sentences without difficulty respirations even and unlabored RA skin warm dry and pink gait sure and steady in clinic.  Leg edema 0-1+/4 nonpitting today.  Avoid caffeine this afternoon.  Patient verbalized understanding information/instructions, agreed with plan of care and had no further questions at this time.

## 2021-10-16 NOTE — Progress Notes (Signed)
Pt forgot to take HCTZ this morning but did take spironolactone. She is going to RTC in 3-4 hrs for recheck.

## 2021-10-16 NOTE — Telephone Encounter (Signed)
Late entry Message left for patient to verify if repeat BP done Tuesday afternoon.  Patient reported it had not been as she did not return to clinic Tuesday for repeat blood pressure check 4 hours after taking hydrochlorothiazide.  Patient reported yesterday she was feeling better on hydrochlorothiazide and would return to clinic on 2/9 for repeat blood pressure check.

## 2021-10-17 ENCOUNTER — Ambulatory Visit: Payer: Self-pay | Admitting: *Deleted

## 2021-10-17 VITALS — BP 124/94

## 2021-10-17 DIAGNOSIS — I10 Essential (primary) hypertension: Secondary | ICD-10-CM

## 2021-10-28 NOTE — Telephone Encounter (Signed)
RN Rolly Salter notified to follow up with patient today for BP recheck.

## 2021-10-30 NOTE — Telephone Encounter (Signed)
Notified by RN Rolly Salter patient refused BP check today as had beef stew last night high in salt.  Legs swollen today and she knew blood pressure would be high and did not want it checked.  Attempted to speak with patient in her workcenter but not at her desk.  RN Rolly Salter would try again this afternoon after NP clinic to see patient and check BP and follow up with her again tomorrow for repeat BP check.  RN Rolly Salter had discussed with patient earlier today to take hydrochlorothiazide 25mg  po today.  Agreed with plan of care.  Discussed with RN need some BP rechecks on hydrochlorothiazide 12.5mg  dosing to see if further dosing adjustments indicated also as spironolactone was not effectively lowering blood pressure and patient has not followed up for BP recheck after starting hydrochlorothiazide 12.5mg .  Email message left for patient to see RN Rolly Salter tomorrow for weight and BP check.  RN Rolly Salter verbalized understanding information/instructions and had no further questions at this time.

## 2021-10-31 ENCOUNTER — Ambulatory Visit: Payer: Self-pay | Admitting: *Deleted

## 2021-10-31 VITALS — BP 142/88

## 2021-10-31 DIAGNOSIS — I10 Essential (primary) hypertension: Secondary | ICD-10-CM

## 2021-10-31 NOTE — Progress Notes (Signed)
Pt checked BP with home wrist cuff. 138/86. BP then checked manually by RN. 142/88. Advised pt this cuff is fine to use to check BP at home regularly.  LLE 1+ edema. RLE trace edema. Reports much better than yesterday. Made homemade beef stew Wednesday and swelling was 2+ and she declined BP check knowing it would be very elevated.   Checks performed in pt's office. No weight obtained today. Pt sts she will come to clinic Mon or Tues next week to update weight.

## 2021-11-01 NOTE — Telephone Encounter (Signed)
Reviewed epic bp 142/88 yesterday manual with RN Rolly Salter.  No weight obtained patient had leg edema.  Patient reported she is feeling better on hydrochlorothiazide than spironolactone. She is wearing leggings with spandex to help with leg swelling. Discussed with patient soup is a healthy diet choice unless it is salt or fat heavy e.g. full fat creams/cheeses.  Discussed since she is salt sensitive may need to adjust salt/bouillon in the recipe to be decreased but vegetable/whole grain soups are a healthy choice.  Canned soups are high in salt unless you buy a low salt option also.  Onions/herbs/shallots/spices are great flavoring choices that do not raise blood pressure.

## 2021-11-06 NOTE — Telephone Encounter (Signed)
Spoke with patient in workcenter feeling well leg swelling resolved only took hydrochlorothiazide 25mg  a couple of days and once leg swelling resolved returned to 12.5mg  dosing po daily.  Discussed with patient to see RN today for BP recheck because if not at goal will increase dosing to 25mg  po daily.  Has felt much better off spironolactone and tolerating hydrochlorothiazide well.  Denied questions or concerns at this time.  Discussed blood pressure goal 110-120/60-70s.  Patient verbalized understanding information/instructions, agreed with plan of care had no further questions at this time. ?

## 2021-11-07 ENCOUNTER — Ambulatory Visit: Payer: Self-pay | Admitting: Internal Medicine

## 2021-11-11 NOTE — Telephone Encounter (Signed)
Patient seen in workcenter.  Stated mother's estate causing her stress.  Was not at work yesterday.  Did not take her blood pressure medication the previous 4 days and just took for first time this am.  Discussed with patient BP recheck Thursday with RN Rolly Salter.  Encouraged patient to take her medication every day as she has 1 bottle at work and 1 at home now.  Keep in a visible place to help remind her to take it.  Patient stated feeling well no headache/visual changes/chest pain/shortness of breath.  Patient A&Ox3 spoke full sentences without difficulty skin warm dry and pink and respirations even and unlabored. ?

## 2021-11-13 NOTE — Telephone Encounter (Signed)
Patient to clinic for BP and weight check today.  Weight 231lbs with shoes/pants/tshirt today  leg edema 0-1+/4 nonpitting bilaterally  126/93 HR 73  Patient reported leg cramps yesterday ate banana.  Bought water from [employer] that has electrolytes also.  Discussed regular/sweet potatoes, soup broths, no sugar added gatorade/powerade recommended.  If diastolic still up on next BP check Tuesday will increase hydrochlorothiazide to 25mg  po daily and repeat BMET in 1 week nonfasting.  Patient reported has friend that is helping to keep her on track with steps every day, water intake and protein goal.  Patient wearing fitbit to help track steps.  Walking dog and taking other walks.    Last Be Well 04/29/21 weight 221 lbs BMI 36.77 and 219 lbs BMI 35.35 on 01/28/2017 on chart review patient notified.  Patient thought weight was going to be worse today and has not weighed herself at home.  Discussed if greater than 5lb weight gain in a week to notify clinic staff.  Patient given ADA handout on nonstarchy vegetables and discussed increasing protein, water and fiber can help her to feel fuller after meals also.  Patient verbalized understanding information/instructions, agreed with plan of care and had no further questions at this time. ?

## 2021-11-18 ENCOUNTER — Encounter: Payer: Self-pay | Admitting: Registered Nurse

## 2021-11-18 ENCOUNTER — Telehealth: Payer: Self-pay | Admitting: Registered Nurse

## 2021-11-18 DIAGNOSIS — U071 COVID-19: Secondary | ICD-10-CM

## 2021-11-18 MED ORDER — ALBUTEROL SULFATE HFA 108 (90 BASE) MCG/ACT IN AERS: 2.0000 | INHALATION_SPRAY | Freq: Four times a day (QID) | RESPIRATORY_TRACT | 0 refills | Status: DC | PRN

## 2021-11-18 MED ORDER — ONDANSETRON 4 MG PO TBDP: 4.0000 mg | ORAL_TABLET | Freq: Three times a day (TID) | ORAL | 0 refills | Status: DC | PRN

## 2021-11-18 MED ORDER — PSEUDOEPH-BROMPHEN-DM 30-2-10 MG/5ML PO SYRP: 5.0000 mL | ORAL_SOLUTION | Freq: Four times a day (QID) | ORAL | 0 refills | Status: AC | PRN

## 2021-11-18 MED ORDER — FLUTICASONE PROPIONATE 50 MCG/ACT NA SUSP: 1.0000 | Freq: Two times a day (BID) | NASAL | 3 refills | Status: DC | PRN

## 2021-11-18 MED ORDER — NIRMATRELVIR/RITONAVIR (PAXLOVID)TABLET: 3.0000 | ORAL_TABLET | Freq: Two times a day (BID) | ORAL | 0 refills | Status: AC

## 2021-11-18 MED ORDER — SALINE SPRAY 0.65 % NA SOLN: 2.0000 | NASAL | 0 refills | Status: DC

## 2021-11-18 NOTE — Telephone Encounter (Signed)
Patient home sick today positive home covid test.  Repeat BP when back onsite RN Rolly Salter notified as will be at home on quarantine for planned recheck this week. ?

## 2021-11-18 NOTE — Telephone Encounter (Signed)
Notified by HR Tim patient reported positive covid test today.  Patient contacted via telephone.  She reported symptoms started Sunday 3/12 diarrhea and home covid test negative. Today with some cough/congestion nasal and would like refill of bromphed syrup and albuterol inhaler as hers is at work.   Patient tested due to coworker close contact positive test 11/15/2021.  Patient reported fever mid day yesterday and retested covid positive today.  Patient thought she had covid antivirals at home but turns out was prednisone Rx.  Patient open to covid antiviral treatment as not vaccinated and risk of complications low-medium age over 69, obesity, hypertension, autoimmune disease.   Pt began quarantine today. Patient did not develop symptoms of  trouble breathing, chest pain, nausea, or vomiting.  ?5 day quarantine per [facility] recommendations. Day 1 of quarantine was 11/18/2021. Patient to contact me if vomiting after coughing or unable to tolerate po fluids. Refilled zofran ODT generic 4mg  po q8h prn n/v #20 RF0 to her pharmacy of choice. If GI upset I have recommended clear fluids then bland diet.  Avoid dairy/spicy, fried and large portions of meat while having nausea.  If vomiting hold po intake x 1 hour.  Then sips clear fluids like broths, ginger ale, power ade, gatorade, pedialyte may advance to soft/bland if no vomiting x 24 hours and appetite returned otherwise hydration main focus. Call me at work from home number if symptoms not improved with plan of care  patient to call if high fever, dehydration, marked weakness, fainting, increased abdominal pain, blood in stool or vomit (red or black).    ? ?Discussed common side effects paxlovid bad taste in mouth like metallic/burned plastic and GI upset.  Electronic Rx sent to her pharmacy of choice after verifying medication in stock with pharmacy staff.  Nirmatrelvir 300mg /ritonavir 100mg  po BID x 5 days #20/#10 RF0   Emergency use Authorization handout paxlovid sent to  patient my chart.  Patient not currently sexually active or trying to get pregnant/breastfeeding.  ? ?Patient to isolate in own room and if possible use only one bathroom if living with others in home.  Wear mask when out of room to help prevent spread to others in household.  Sanitize high touch surfaces with lysol/chlorox/bleach spray or wipes daily as viruses are known to live on surfaces from 24 hours to days. ? ?Patient may use flonase nasal 1 spray each nostril BID prn rhinitis.   Nasal saline 2 sprays each nostril q2h wa prn congestion OTC.  Pseudoephedrine brompheniramine dextromethorphan 30/2/10 per 54ml take 83ml po q6h prn cough/rhinitis #120 RF0 electronic Rx to her pharmacy of choice.  Discussed albuterol use with patient 1-2 puffs po q4-6 hours prn protracted coughing/wheezing #1 RF0 electronic Rx to her pharmacy of choice.  Honey 1 tablespoon every 4 hours is a natural cough suppressant Avoid dehydration and drink water to keep urine pale yellow clear and voiding every 2-4 hours while awake.  Patient alert and oriented x3, spoke full sentences without difficulty.  Some nasal congestion noted.  Rare nonproductive cough.  No throat clearing during 6 minute telephone call.  Discussed with patient she can contact me at [phone] when clinic closed if questions or concerns until RN 4m returns to clinic on Thursday [phone]. ? ?Exitcare handouts on covid home care, quarantine, paxlovid and diarrhea/foods to relieve diarrhea sent to her my chart. ?  ?Pt verbalized understanding and agreement with plan of care. No further questions/concerns at this time. Pt reminded to contact clinic  with any changes in symptoms or questions/concerns. HR notified patient to work remote/quarantine through Day 5 11/23/21 RTW estimated Day 6 3/20 with strict mask wear through Day 10 3/24 and no eating in employee lunch room.  Estimated return to work onsite 11/24/2021  ?

## 2021-11-19 NOTE — Telephone Encounter (Signed)
Patient reported paxlovid is helping but having diarrhea today.  Feeling weak and like her legs may give out.  Has been sipping water with electrolytes.  I have recommended clear fluids and advanced to soft as tolerated.  Avoid ?dairy, spicy and fried foods until diarrhea resolves. Avoid dehydration drink noncaffeinated beverages (bouillon soup broth,  no sugar added gatorade/powerade) to urinate every 2-4 hours pale yellow urine.  It is easy to become dehydrated when having diarrhea along with electrolyte imbalances.  Patient to take  temperature and if less than 100.5 F may take over the counter Imodium per manufacturer's instructions.  Patient given exitcare handouts on diarrhea and foods to relieve diarrhea. Medications as directed.  Notify clinic staff if urine dark yellow/gold/brown, worsening dizziness or headache despite drinking at least 1 cup soup/electrolytes per hour when awake.   Patient concerned her dog could get covid from her.  Discussed avoid coughing on dog/in dog face, handwashing/sanitizing frequently and increase air circulation in home to bring in fresh air to help prevent dog infection.  Discussed with patient I asked my vet last year how often they are seeing pets with covid during surge and she stated it was not common but have seen a rare case.  Discussed if worsening dehydration symptoms may need to be seen at ER for hydration/evaluation.  Patient verbalized agreement and understanding of treatment plan and had no further questions at this time. ?P2: Hand washing and fitness  ?

## 2021-11-20 NOTE — Telephone Encounter (Signed)
Patient reported feeling better somewhat today but mucous thicker.  Still tired and cough started today.  Patient has bromphed DM at home discussed taking it on a  schedule and honey 1 tablespoon every 4 hours prn cough and hydrating to help liquefy mucous. ?

## 2021-11-23 NOTE — Telephone Encounter (Signed)
Patient reported still having fatigue/feeling tired and typically mornings worst but typically improves once she gets moving for the day.  Planning to work remote tomorrow and hopefully back in the office on Tuesday.  Asked if she needs to repeat covid test and stated not unless she is trying to stop mask wear sooner than 10 days would need 2 negative home covid tests 48 hours apart.  Discussed typically if having symptoms covid test usually still positive.  Patient asked when fatigue typically improves and patient notified with other patients they are typically feeling better between days 10-14.  Patient had no further questions or concerns at this time. ?

## 2021-11-25 NOTE — Telephone Encounter (Signed)
Patient seen in workcenter feeling fatigued still, wearing mask at work cloth.  Discussed with patient each individual variable with symptoms but typically fatigue improving after day 10.  If feeling like she needs nap to take nap.  Patient reported had not taken thyroid medication when she was at home at quarantine.  Has taken blood pressure medication the past few days.  Discussed not cytomel and synthroid could be making her feel fatigued also.  Patient did feel paxlovid helped her as fever/chills broke after starting it.  Sp02 98% HR 84 RR16 BBS CTA skin warm dry and pink gait sure and steady in her office.  Spoke full sentences without difficulty.  A&Ox3 respirations even and unlabored no cough/congestion or throat clearing observed during 10 minute conversation.  Encouraged patient to restart her cytomel and synthroid and taking her hydrochlorothiazide every day.  Has not required albuterol inhaler.  Eating regular meals/hydrating also important.  Patient verbalized understanding information/instructions, agreed with plan of care and had no further questions at this time. ?

## 2021-11-25 NOTE — Telephone Encounter (Signed)
Patient notified NP she had not been taking her hydrochlorothiazide consistently while at home sick with covid.  Will check BP next week.  Encouraged patient to take her medication daily.  Patient verbalized understanding information/instructions and had no further questions at this time. ?

## 2021-12-02 ENCOUNTER — Ambulatory Visit: Payer: Self-pay | Admitting: *Deleted

## 2021-12-02 VITALS — BP 142/86

## 2021-12-02 DIAGNOSIS — I10 Essential (primary) hypertension: Secondary | ICD-10-CM

## 2021-12-02 NOTE — Progress Notes (Signed)
BP check. Did take meds this morning around 0900.  ?

## 2021-12-03 ENCOUNTER — Telehealth: Payer: Self-pay | Admitting: Registered Nurse

## 2021-12-03 DIAGNOSIS — I10 Essential (primary) hypertension: Secondary | ICD-10-CM

## 2021-12-03 MED ORDER — HYDROCHLOROTHIAZIDE 12.5 MG PO TABS: 25.0000 mg | ORAL_TABLET | Freq: Every day | ORAL | 0 refills | Status: DC

## 2021-12-03 NOTE — Telephone Encounter (Signed)
Patient seen in workcenter 12/02/21 feeling better this week.  Has been taking her medications as prescribed.  Saw PCM for office visit. Improved energy level and feels better now that not having to wear mask at work.  Makes her feel like breathing constricted last week.  A&Ox3 spoke full sentences without difficulty skin warm dry and pink gait sure and steady in workcenter.  No cough/congestion/throat clearing observed.  Encounter closed post day 10 symptom check in. ?

## 2021-12-03 NOTE — Telephone Encounter (Signed)
Patient seen in workcenter.  A&Ox3 spoke full sentences without difficulty.  Denied leg swelling/headache/visual changes/chest pain/dyspnea with exertion.  Has been taking her hydrochlorothiazide 12.5mg  po daily.  Has one bottle at work and another at home.  Unsure how many pills remaining in each bottle will check today.  Asked patient to see RN Rolly Salter today for BP check/weight check.  Patient agreed with plan of care and had no further questions at this time. ? ?BP with RN Rolly Salter 142/86.  Not at goal.  Patient tolerating hydrochlorothiazide 12.5mg  well will increase to 25mg  po daily.  Have BP follow up with RN next week.  Nonfasting BMET to check Cr/electrolytes with dose change.  Patient notified, agreed with plan of care and will take 2 12.5mg  tabs at this time to use up her current supply.  Patient had no further questions at this time.  See new tcon opened with today's date. ?

## 2021-12-03 NOTE — Telephone Encounter (Signed)
Repeat BP next week with RN Rolly Salter.  Follow up sooner if dizziness.  Nonfasting BMET next week to check Cr/electrolytes with increased hydrochlorothiazide dose also to 25mg  po daily.  BP goal less than 130/80.  Patient verbalized understanding information/instructions, agreed with plan of care and had no further questions at this time. ?

## 2021-12-09 ENCOUNTER — Ambulatory Visit: Payer: Self-pay | Admitting: *Deleted

## 2021-12-09 VITALS — BP 116/97 | HR 79 | Ht 65.5 in | Wt 232.0 lb

## 2021-12-09 DIAGNOSIS — I10 Essential (primary) hypertension: Secondary | ICD-10-CM

## 2021-12-09 DIAGNOSIS — Z0000 Encounter for general adult medical examination without abnormal findings: Secondary | ICD-10-CM

## 2021-12-09 NOTE — Progress Notes (Signed)
BP and wt check. Be Well check-off as well with pcp labs drawn 12/01/21. ? ?Be Well insurance premium discount evaluation: ?Replacements ROI form signed. ?Tobacco Free Attestation form signed.  ?Forms placed in paper chart. ? ?

## 2021-12-10 NOTE — Telephone Encounter (Signed)
Late entry spoke with patient in office/her workcenter today.  Still having foggy memory since covid.  Had labs with [facility] Monday but unsure what was drawn.  Taking all of her prescribed medications.  Discussed if no renal function/electrolytes will draw in clinic nonfasting at [facility].  Tolerating new dose hydrochlorothiazide 13m without difficulty denied headache, visual changes, n/v, shortness of breath or chest pain/leg swelling.  Patient A&Ox3 spoke full sentences without difficulty gait sure and steady in her office skin warm dry and pink respirations even and unlabored.  Patient agreed with plan of care and had no further questions at this time. ? ?[place] [facility] [place] [place] HHildred [facility]  Cr 0.74 egfr 94 K 4 sodium 139 LFTs WNL and glucose 92  normal Lipids  tchol 185 trigs 138 HDL 57 LDL 104  Iron 101 Iron sat 40 TIBC 255 Hgba1c 5.4  TSH 0.013 T4 free 0.89 TPO Ab elevated 239 normal 0 to 34; thyroglobulin antibody 1.5 (elevated normal 0-.9 Vitamin D 47.6  Free T3 normal 2.7  magnesium 5.5 normal Copper and zinc normal  Patient [facility] and nothing in her patient portal.  Patient encouraged to have BP and weight check with RN HHildred Alamintoday.  RN HHildred Alaminto [facility] ?

## 2021-12-11 NOTE — Telephone Encounter (Signed)
Patient following up with PCM regarding thyroid labs.    BP 116/97 HR 79 with RN Olivia Hamilton Tuesday and weight 232 lbs.  Last weight on file clinic scale 219lbs.  2018  Patient denied new or worsening symptoms. A&Ox3 spoke full sentences without difficulty.  Gait sure and steady.  No cough/nasal sniffing/congestion.  Continue medications as prescribed, continue weight loss efforts/low caffeine/sodium.  Follow up with RN Olivia Hamilton 1-2 weeks repeat BP check and weight check in 1 month. ?

## 2021-12-30 NOTE — Telephone Encounter (Signed)
Attempted to follow up with patient today out of office on vacation returning later this week. ?

## 2022-01-05 DIAGNOSIS — K8020 Calculus of gallbladder without cholecystitis without obstruction: Secondary | ICD-10-CM

## 2022-01-05 HISTORY — DX: Calculus of gallbladder without cholecystitis without obstruction: K80.20

## 2022-01-06 NOTE — Telephone Encounter (Signed)
Patient seen in workcenter stated has been taking hydrochlorothiazide 25mg  po daily without difficulty.  If she misses dose she takes as soon as she remembers.  [place] in [place] last week handling mother estate issues.  Discussed need BP/weight recheck with RN today.  She stated she would go to clinic to have performed.  Patient stated feeling well denied concerns.  A&Ox3 gait sure and steady skin warm dry and pink.Rolly Salter ?

## 2022-01-08 ENCOUNTER — Ambulatory Visit: Payer: Self-pay | Admitting: *Deleted

## 2022-01-08 VITALS — BP 127/101 | HR 71 | Wt 228.0 lb

## 2022-01-08 DIAGNOSIS — I10 Essential (primary) hypertension: Secondary | ICD-10-CM

## 2022-01-08 NOTE — Telephone Encounter (Signed)
12/09/21 BP 116/97 weight 232lbs HR 79 with RN Haley.  Today patient took hydrochlorothiazide 2 hours prior to BP check. 127/101 weight 228lbs loss of 5 lbs since last month. Discussed with patient to recheck blood pressure in afternoon avoid caffeine may need to add another medication to patient regimen as not at goal.  Patient verbalized understanding information/instructions and had no further questions at this time. ?

## 2022-01-08 NOTE — Telephone Encounter (Signed)
Patient notified NP she has been taking zyrtec and benadryl at bedtime and asking if that could affect her blood pressure.  Discussed with patient it could.  Discussed SBP closer to goal than DBP.  Discussed systolic blood pressure is pressure in blood vessels when heart ejecting blood and DBP pressure at rest.  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  She has been having difficulty finding protein alternatives low in sugar for breakfast meals.  Discussed egg/vegetable fritatta/omelette e.g. mushrooms, sundried tomatoes, onion, bellpepper, spinach.  Overnight oats with flax or chia seeds/nuts and fruit fresh or frozen without added sugar.  Patient doesn't make oatmeal during the week as not enough time in the morning for hot oatmeal.  Patient to see RN Rolly Salter next week for blood pressure recheck in the afternoon.  Discussed take zyrtec 10mg  po qam and if needed benadryl 25mg  po qhs.  Patient reported eye itching/swelling her worst symptoms at this time.  Discussed refresh eye drops/cool compresses.  Congratulated patient on 5 lb weight loss from last month.  Discussed with patient consider swapping fruit fresh or frozen without added sugar instead of dessert/baked goods.  Increasing her activity/steps.  Consider diet logging and appt with [facility] dietitian for weight loss assistance.  Patient stopped [place] provider due to cost.  Patient stated the coconut milk yogurt she has with little sugar is low in protein also.  Discussed nuts, legumes, seeds as alternate protein sources also in addition to chicken/fish.  Discussed cavier cowbow (bean salad) recipe with patient.  Discussed overnight oats recipe with patient and recommended [employer] Food Hub recipe section as good source of healthy recipes to help her with meal ideas as she doesn't like to eat same thing every day.  Discussed if blood pressure not improving e.g. goal less than 120 SBP and 80 DBP will need to add  another antihypertensive medication.  Patient reported she is increasing walking and trying to eat better/continue weight loss.  Patient verbalized understanding information/instructions, agreed with plan of care and had no further questions at this time. ?

## 2022-01-16 ENCOUNTER — Ambulatory Visit: Payer: Self-pay

## 2022-01-16 NOTE — Telephone Encounter (Signed)
?  Chief Complaint: lower rib cage aching - pt thinks it is her gall bladder due to having pain in area after eating greasy food ?Symptoms: intermittent ache at the bottom of rib cage ?Frequency: worsened Wednesday ?Pertinent Negatives: Patient denies back pain , diarrhea, urination pain, vomiting ?Disposition: [] ED /[] Urgent Care (no appt availability in office) / [x] Appointment(In office/virtual)/ []  [facility]/ [] Home Care/ [] Refused Recommended Disposition /[] [facility]/ []  Follow-up with PCP ?Additional Notes: Advised pt to goto UC and stated she would rather have appt with Rollene Fare aand will do a VV if worsens ? ? ? ?Reason for Disposition ? [1] MODERATE pain (e.g., interferes with normal activities) AND [2] pain comes and goes (cramps) AND [3] present > 24 hours  (Exception: pain with Vomiting or Diarrhea - see that Guideline) ? ?Answer Assessment - Initial Assessment Questions ?1. LOCATION: "Where does it hurt?"  ?    Lower rib cage ?2. RADIATION: "Does the pain shoot anywhere else?" (e.g., chest, back) ?    no ?3. ONSET: "When did the pain begin?" (e.g., minutes, hours or days ago)  ?    Resembles gall bladder attack  - since Wednesday ?4. SUDDEN: "Gradual or sudden onset?" ?    gradual ?5. PATTERN "Does the pain come and go, or is it constant?" ?   - If constant: "Is it getting better, staying the same, or worsening?"  ?    (Note: Constant means the pain never goes away completely; most serious pain is constant and it progresses)  ?   - If intermittent: "How long does it last?" "Do you have pain now?" ?    (Note: Intermittent means the pain goes away completely between bouts) ?    Constant ?6. SEVERITY: "How bad is the pain?"  (e.g., Scale 1-10; mild, moderate, or severe) ?  - MILD (1-3): doesn't interfere with normal activities, abdomen soft and not tender to touch  ?  - MODERATE (4-7): interferes with normal activities or awakens from sleep, abdomen tender to touch  ?  - SEVERE  (8-10): excruciating pain, doubled over, unable to do any normal activities  ?    Mild dull ache ?7. RECURRENT SYMPTOM: "Have you ever had this type of stomach pain before?" If Yes, ask: "When was the last time?" and "What happened that time?"  ?    Yes- on and off  ?8. CAUSE: "What do you think is causing the stomach pain?" ?    Gall  bladder ?9. RELIEVING/AGGRAVATING FACTORS: "What makes it better or worse?" (e.g., movement, antacids, bowel movement) ?    Eating greasy food ?10. OTHER SYMPTOMS: "Do you have any other symptoms?" (e.g., back pain, diarrhea, fever, urination pain, vomiting) ?      no ? ?Protocols used: Abdominal Pain - Female-A-AH ? ?

## 2022-01-20 ENCOUNTER — Ambulatory Visit: Payer: No Typology Code available for payment source | Admitting: Internal Medicine

## 2022-01-20 ENCOUNTER — Other Ambulatory Visit: Payer: Self-pay | Admitting: Registered Nurse

## 2022-01-20 ENCOUNTER — Encounter: Payer: Self-pay | Admitting: Internal Medicine

## 2022-01-20 VITALS — BP 118/76 | HR 89 | Temp 97.5°F | Wt 225.0 lb

## 2022-01-20 DIAGNOSIS — R1084 Generalized abdominal pain: Secondary | ICD-10-CM | POA: Diagnosis not present

## 2022-01-20 DIAGNOSIS — I10 Essential (primary) hypertension: Secondary | ICD-10-CM

## 2022-01-20 LAB — POCT URINALYSIS DIPSTICK
Bilirubin, UA: NEGATIVE
Blood, UA: NEGATIVE
Glucose, UA: NEGATIVE
Ketones, UA: NEGATIVE
Leukocytes, UA: NEGATIVE
Nitrite, UA: NEGATIVE
Protein, UA: NEGATIVE
Spec Grav, UA: 1.015 (ref 1.010–1.025)
Urobilinogen, UA: 0.2 E.U./dL
pH, UA: 5 (ref 5.0–8.0)

## 2022-01-20 MED ORDER — HYDROCHLOROTHIAZIDE 25 MG PO TABS
25.0000 mg | ORAL_TABLET | Freq: Every day | ORAL | 0 refills | Status: DC
Start: 2022-01-20 — End: 2022-04-30

## 2022-01-20 NOTE — Patient Instructions (Signed)
Abdominal Pain, Adult Many things can cause belly (abdominal) pain. Most times, belly pain is not dangerous. Many cases of belly pain can be watched and treated at home. Sometimes, though, belly pain is serious. Your doctor will try to find the cause of your belly pain. Follow these instructions at home:  Medicines Take over-the-counter and prescription medicines only as told by your doctor. Do not take medicines that help you poop (laxatives) unless told by your doctor. General instructions Watch your belly pain for any changes. Drink enough fluid to keep your pee (urine) pale yellow. Keep all follow-up visits as told by your doctor. This is important. Contact a doctor if: Your belly pain changes or gets worse. You are not hungry, or you lose weight without trying. You are having trouble pooping (constipated) or have watery poop (diarrhea) for more than 2-3 days. You have pain when you pee or poop. Your belly pain wakes you up at night. Your pain gets worse with meals, after eating, or with certain foods. You are vomiting and cannot keep anything down. You have a fever. You have blood in your pee. Get help right away if: Your pain does not go away as soon as your doctor says it should. You cannot stop vomiting. Your pain is only in areas of your belly, such as the right side or the left lower part of the belly. You have bloody or black poop, or poop that looks like tar. You have very bad pain, cramping, or bloating in your belly. You have signs of not having enough fluid or water in your body (dehydration), such as: Dark pee, very little pee, or no pee. Cracked lips. Dry mouth. Sunken eyes. Sleepiness. Weakness. You have trouble breathing or chest pain. Summary Many cases of belly pain can be watched and treated at home. Watch your belly pain for any changes. Take over-the-counter and prescription medicines only as told by your doctor. Contact a doctor if your belly pain  changes or gets worse. Get help right away if you have very bad pain, cramping, or bloating in your belly. This information is not intended to replace advice given to you by your health care provider. Make sure you discuss any questions you have with your health care provider. Document Revised: 01/02/2019 Document Reviewed: 01/02/2019 Elsevier Patient Education  2023 [employer]  

## 2022-01-20 NOTE — Progress Notes (Signed)
? ?Subjective:  ? ? Patient ID: Olivia Hamilton, female    DOB: Jul 14, 1966, 56 y.o.   MRN: [id number] ? ?HPI ? ?Patient presents to clinic today with complaint of abdominal pain.  This started 5 days ago. She describes the pain as discomfort/squeezing in the epigastric region. The pain seems worse at night but does seem better today. She denies nausea, vomiting, reflux, constipation, diarrhea, blood in her stool, urinary urgency, frequency, dysuria, blood in her urine, vaginal discharge and odor.  She has tried Tylenol and Ibuprofen OTC with some relief of symptoms.  She no longer has her menstrual cycle.  She has had gallstones in the past but opted not to have her gallbladder removed at that time back in 2008.  She reports the symptoms do not necessarily feel the same. ? ?Review of Systems ? ? ?Past Medical History:  ?Diagnosis Date  ? ADD (attention deficit disorder)   ? Depression   ? Dry mouth   ? Gallbladder disease   ? Hashimoto's disease   ? High blood pressure   ? PCOS (polycystic ovarian syndrome)   ? Swelling   ? Thyroid disease   ? Varicose veins   ? ? ?Current Outpatient Medications  ?Medication Sig Dispense Refill  ? albuterol (VENTOLIN HFA) 108 (90 Base) MCG/ACT inhaler Inhale 2 puffs into the lungs every 6 (six) hours as needed for wheezing or shortness of breath. 8 g 0  ? Ascorbic Acid (VITAMIN C) 1000 MG tablet Take 1,000 mg by mouth daily.    ? aspirin 81 MG tablet Take 81 mg by mouth daily.     ? Ca Phosphate-Cholecalciferol (CALCIUM/VITAMIN D3 GUMMIES) 250-350 MG-UNIT CHEW Chew 250 mg by mouth. Two daily    ? estradiol (ESTRACE) 0.1 MG/GM vaginal cream Place 1 Applicatorful vaginally at bedtime. Compound Rx    ? fluticasone (FLONASE) 50 MCG/ACT nasal spray Place 1 spray into both nostrils 2 (two) times daily as needed for allergies or rhinitis. 48 mL 3  ? hydrochlorothiazide (HYDRODIURIL) 12.5 MG tablet Take 2 tablets (25 mg total) by mouth daily. 90 tablet 0  ? levothyroxine (SYNTHROID,  LEVOTHROID) 88 MCG tablet Take 88 mcg by mouth daily before breakfast.    ? liothyronine (CYTOMEL) 50 MCG tablet Take 1 tablet by mouth daily.    ? loratadine (CLARITIN) 10 MG tablet Take 10 mg by mouth daily.    ? NON FORMULARY compounded medication ? ESTRIOL    ? ondansetron (ZOFRAN-ODT) 4 MG disintegrating tablet Take 1 tablet (4 mg total) by mouth every 8 (eight) hours as needed for nausea or vomiting. 20 tablet 0  ? progesterone (PROMETRIUM) 100 MG capsule Take 1 capsule by mouth daily. For 12 days    ? Semaglutide (RYBELSUS) 3 MG TABS Take 3 mg by mouth daily. 30 tablet 0  ? sodium chloride (OCEAN) 0.65 % SOLN nasal spray Place 2 sprays into both nostrils every 2 (two) hours while awake.  0  ? valACYclovir (VALTREX) 1000 MG tablet Take 1,000 mg by mouth 2 (two) times daily. (Patient not taking: Reported on 09/11/2021)    ? Vitamin D, Ergocalciferol, (DRISDOL) 1.25 MG (50000 UNIT) CAPS capsule Take one cap once weekly 4 capsule 0  ? Vitamins A & D (VITAMIN A & D) [address] units TABS Take 25,000 Units by mouth. 3 x weekly    ? ?No current facility-administered medications for this visit.  ? ? ?Allergies  ?Allergen Reactions  ? Gluten Meal Swelling  ? Sulfonamide Derivatives   ?  REACTION: eye swelling  ? Whey Protein [Protein] Diarrhea  ? ? ?Family History  ?Problem Relation Age of Onset  ? Obesity Mother   ? Thyroid disease Mother   ? Other Mother   ?     VARICOSE VEINS  ? Arthritis Mother   ? Hypertension Mother   ? Obesity Father   ? Cancer Father   ? Hypertension Father   ? Lung cancer Father   ? Breast cancer Neg Hx   ? ? ?Social History  ? ?Socioeconomic History  ? Marital status: Married  ?  Spouse name: Not on file  ? Number of children: Not on file  ? Years of education: Not on file  ? Highest education level: Not on file  ?Occupational History  ? Occupation: Investment banker, corporate  ?Tobacco Use  ? Smoking status: Never  ? Smokeless tobacco: Never  ?Substance and Sexual Activity  ? Alcohol use:  Yes  ?  Alcohol/week: 2.0 standard drinks  ?  Types: 1 Glasses of wine, 1 Cans of beer per week  ? Drug use: Never  ? Sexual activity: Never  ?Other Topics Concern  ? Not on file  ?Social History Narrative  ? Not on file  ? ?Social Determinants of Health  ? ?Financial Resource Strain: Not on file  ?Food Insecurity: Not on file  ?Transportation Needs: Not on file  ?Physical Activity: Not on file  ?Stress: Not on file  ?Social Connections: Not on file  ?Intimate Partner Violence: Not on file  ? ? ? ?Constitutional: Denies fever, malaise, fatigue, headache or abrupt weight changes.  ?Respiratory: Denies difficulty breathing, shortness of breath, cough or sputum production.   ?Cardiovascular: Denies chest pain, chest tightness, palpitations or swelling in the hands or feet.  ?Gastrointestinal: Patient reports abdominal pain.  Denies bloating, constipation, diarrhea or blood in the stool.  ?GU: Denies urgency, frequency, pain with urination, burning sensation, blood in urine, odor or discharge. ?Skin: Denies redness, rashes, lesions or ulcercations.  ? ?No other specific complaints in a complete review of systems (except as listed in HPI above). ? ? ?Objective:  ? Physical Exam ? ?BP 118/76 (BP Location: Left Arm, Patient Position: Sitting, Cuff Size: Large)   Pulse 89   Temp (!) 97.5 ?F (36.4 ?C) (Temporal)   Wt 225 lb (102.1 kg)   SpO2 100%   BMI 36.87 kg/m?  ? ?Wt Readings from Last 3 Encounters:  ?01/08/22 228 lb (103.4 kg)  ?12/09/21 232 lb (105.2 kg)  ?06/10/21 225 lb (102.1 kg)  ? ? ?General: Appears her stated age, obese, in NAD. ?Skin: Warm, dry and intact.  ?HEENT: Head: normal shape and size; Eyes: sclera white, no icterus, conjunctiva pink, PERRLA and EOMs intact;  ?Cardiovascular: Normal rate and rhythm. S1,S2 noted.  No murmur, rubs or gallops noted.  ?Pulmonary/Chest: Normal effort and positive vesicular breath sounds. No respiratory distress. No wheezes, rales or ronchi noted.  ?Abdomen: Soft and  nontender. Normal bowel sounds. No distention or masses noted. Liver, spleen and kidneys non palpable. ?Musculoskeletal: No difficulty with gait.  ?Neurological: Alert and oriented. ? ?BMET ?   ?Component Value Date/Time  ? NA 139 03/05/2021 0918  ? NA 142 02/18/2021 1025  ? K 4.2 03/05/2021 0918  ? CL 102 03/05/2021 0918  ? CO2 31 03/05/2021 0918  ? GLUCOSE 109 (H) 03/05/2021 3300  ? BUN 11 03/05/2021 0918  ? BUN 15 02/18/2021 1025  ? CREATININE 0.95 03/05/2021 0918  ? CALCIUM 9.7 03/05/2021  [place] 76 07/05/2020 1005  ? GFRAA 88 07/05/2020 1005  ? ? ?Lipid Panel  ?   ?Component Value Date/Time  ? CHOL 219 (H) 02/18/2021 1025  ? TRIG 106 02/18/2021 1025  ? HDL 75 02/18/2021 1025  ? CHOLHDL 3.0 01/28/2017 0917  ? LDLCALC 126 (H) 02/18/2021 1025  ? ? ?CBC ?   ?Component Value Date/Time  ? WBC 5.9 02/18/2021 1025  ? RBC 5.20 02/18/2021 1025  ? HGB 15.3 02/18/2021 1025  ? HCT 46.6 02/18/2021 1025  ? PLT 234 02/18/2021 1025  ? MCV 90 02/18/2021 1025  ? MCH 29.4 02/18/2021 1025  ? MCHC 32.8 02/18/2021 1025  ? RDW 12.4 02/18/2021 1025  ? LYMPHSABS 1.8 02/18/2021 1025  ? EOSABS 0.3 02/18/2021 1025  ? BASOSABS 0.1 02/18/2021 1025  ? ? ?Hgb A1C ?Lab Results  ?Component Value Date  ? HGBA1C 5.2 02/18/2021  ? ? ? ? ? ? ?   ?Assessment & Plan:  ? ?Generalized Abdominal Pain: ? ?We will check CBC, c-Met, amylase and lipase ?Urinalysis: normal ?Advised her to continue Tylenol and Ibuprofen OTC as needed ?Would consider CT abdomen/pelvis if labs come back abnormal or if pain persist/worsens ? ?We will follow-up after labs with further recommendation and treatment plan ?Webb Silversmith, NP ? ?

## 2022-01-20 NOTE — Progress Notes (Signed)
Patient requested refill hydrochlorothiazide 25mg  po daily.  Dispensed from [facility] to patient today #90 RF0. BP 118/76 at [facility] office today.  Last fill 10/26/2021 per paper chart review.  Labs drawn at [facility] office today. ?

## 2022-01-21 ENCOUNTER — Ambulatory Visit: Payer: Self-pay

## 2022-01-21 ENCOUNTER — Encounter: Payer: Self-pay | Admitting: Internal Medicine

## 2022-01-21 DIAGNOSIS — R1084 Generalized abdominal pain: Secondary | ICD-10-CM

## 2022-01-21 LAB — COMPLETE METABOLIC PANEL WITH GFR
AG Ratio: 1.8 (calc) (ref 1.0–2.5)
ALT: 20 U/L (ref 6–29)
AST: 25 U/L (ref 10–35)
Albumin: 4.4 g/dL (ref 3.6–5.1)
Alkaline phosphatase (APISO): 96 U/L (ref 37–153)
BUN: 12 mg/dL (ref 7–25)
CO2: 30 mmol/L (ref 20–32)
Calcium: 10 mg/dL (ref 8.6–10.4)
Chloride: 99 mmol/L (ref 98–110)
Creat: 0.94 mg/dL (ref 0.50–1.03)
Globulin: 2.5 g/dL (calc) (ref 1.9–3.7)
Glucose, Bld: 81 mg/dL (ref 65–99)
Potassium: 4 mmol/L (ref 3.5–5.3)
Sodium: 140 mmol/L (ref 135–146)
Total Bilirubin: 0.7 mg/dL (ref 0.2–1.2)
Total Protein: 6.9 g/dL (ref 6.1–8.1)
eGFR: 71 mL/min/{1.73_m2} (ref >=60)

## 2022-01-21 LAB — CBC
HCT: 48.2 % — ABNORMAL HIGH (ref 35.0–45.0)
Hemoglobin: 16.3 g/dL — ABNORMAL HIGH (ref 11.7–15.5)
MCH: 30.4 pg (ref 27.0–33.0)
MCHC: 33.8 g/dL (ref 32.0–36.0)
MCV: 89.9 fL (ref 80.0–100.0)
MPV: 9.9 fL (ref 7.5–12.5)
Platelets: 316 10*3/uL (ref 140–400)
RBC: 5.36 10*6/uL — ABNORMAL HIGH (ref 3.80–5.10)
RDW: 13 % (ref 11.0–15.0)
WBC: 7.1 10*3/uL (ref 3.8–10.8)

## 2022-01-21 LAB — LIPASE: Lipase: 21 U/L (ref 7–60)

## 2022-01-21 LAB — AMYLASE: Amylase: 32 U/L (ref 21–101)

## 2022-01-21 NOTE — Telephone Encounter (Signed)
?  Chief Complaint: Abdominal pain - Weakness ?Symptoms: ibid ?Frequency: ongoing - attack last night  ?Pertinent Negatives: Patient denies  ?Disposition: [] ED /[] Urgent Care (no appt availability in office) / [x] Appointment(In office/virtual)/ []  [facility]/ [] Home Care/ [] Refused Recommended Disposition /[] [facility]/ []  Follow-up with PCP ?Additional Notes: Pt was seen for this yesterday and is awaiting call from provider regarding lab work. Pt had another attack last night about 4 am. Pt had grilled chicken and green peppers for supper at 6-7 pm.  ?Unsure if pt needed to be seen  - made appointment for tomorrow for follow up. If not needed please cancel. ? ?Reason for Disposition ? [1] MODERATE pain (e.g., interferes with normal activities) AND [2] pain comes and goes (cramps) AND [3] present > 24 hours  (Exception: pain with Vomiting or Diarrhea - see that Guideline) ? ?Answer Assessment - Initial Assessment Questions ?1. LOCATION: "Where does it hurt?"  ?    Abdomen ?2. RADIATION: "Does the pain shoot anywhere else?" (e.g., chest, back) ?     ?3. ONSET: "When did the pain begin?" (e.g., minutes, hours or days ago)  ?    Last night 4am ?4. SUDDEN: "Gradual or sudden onset?" ?    sudden ?5. PATTERN "Does the pain come and go, or is it constant?" ?   - If constant: "Is it getting better, staying the same, or worsening?"  ?    (Note: Constant means the pain never goes away completely; most serious pain is constant and it progresses)  ?   - If intermittent: "How long does it last?" "Do you have pain now?" ?    (Note: Intermittent means the pain goes away completely between bouts) ?    Comes and goes ?6. SEVERITY: "How bad is the pain?"  (e.g., Scale 1-10; mild, moderate, or severe) ?  - MILD (1-3): doesn't interfere with normal activities, abdomen soft and not tender to touch  ?  - MODERATE (4-7): interferes with normal activities or awakens from sleep, abdomen tender to touch  ?  - SEVERE  (8-10): excruciating pain, doubled over, unable to do any normal activities  ?    Currently none ?7. RECURRENT SYMPTOM: "Have you ever had this type of stomach pain before?" If Yes, ask: "When was the last time?" and "What happened that time?"  ?    yes ?8. CAUSE: "What do you think is causing the stomach pain?" ?    unsure ?9. RELIEVING/AGGRAVATING FACTORS: "What makes it better or worse?" (e.g., movement, antacids, bowel movement) ?     ?10. OTHER SYMPTOMS: "Do you have any other symptoms?" (e.g., back pain, diarrhea, fever, urination pain, vomiting) ?      Weakness ?11. PREGNANCY: "Is there any chance you are pregnant?" "When was your last menstrual period?" ?      na ? ?Protocols used: Abdominal Pain - Female-A-AH ? ?

## 2022-01-21 NOTE — Telephone Encounter (Signed)
See MyChart message.  ° °Thanks,  ° °-Bernerd Terhune  °

## 2022-01-22 ENCOUNTER — Other Ambulatory Visit: Payer: Self-pay | Admitting: Internal Medicine

## 2022-01-22 ENCOUNTER — Ambulatory Visit: Payer: No Typology Code available for payment source | Admitting: Internal Medicine

## 2022-01-22 DIAGNOSIS — R1084 Generalized abdominal pain: Secondary | ICD-10-CM

## 2022-01-22 NOTE — Telephone Encounter (Signed)
Spoke with pt.  She is okay with going forward with the U/S

## 2022-01-23 ENCOUNTER — Emergency Department
Admission: EM | Admit: 2022-01-23 | Discharge: 2022-01-23 | Disposition: A | Discharge status: Home / Self Care | Payer: No Typology Code available for payment source | Attending: Emergency Medicine | Admitting: Emergency Medicine

## 2022-01-23 ENCOUNTER — Encounter: Payer: Self-pay | Admitting: Emergency Medicine

## 2022-01-23 ENCOUNTER — Other Ambulatory Visit: Payer: Self-pay

## 2022-01-23 ENCOUNTER — Emergency Department: Payer: No Typology Code available for payment source

## 2022-01-23 ENCOUNTER — Ambulatory Visit: Admission: RE | Admit: 2022-01-23 | Payer: No Typology Code available for payment source | Source: Ambulatory Visit

## 2022-01-23 DIAGNOSIS — K8050 Calculus of bile duct without cholangitis or cholecystitis without obstruction: Secondary | ICD-10-CM

## 2022-01-23 DIAGNOSIS — I10 Essential (primary) hypertension: Secondary | ICD-10-CM | POA: Diagnosis not present

## 2022-01-23 DIAGNOSIS — R1011 Right upper quadrant pain: Secondary | ICD-10-CM | POA: Diagnosis present

## 2022-01-23 LAB — URINALYSIS, ROUTINE W REFLEX MICROSCOPIC
Bacteria, UA: NONE SEEN
Bilirubin Urine: NEGATIVE
Glucose, UA: NEGATIVE mg/dL
Ketones, ur: NEGATIVE mg/dL
Leukocytes,Ua: NEGATIVE
Nitrite: NEGATIVE
Protein, ur: NEGATIVE mg/dL
Specific Gravity, Urine: 1.004 — ABNORMAL LOW (ref 1.005–1.030)
pH: 6 (ref 5.0–8.0)

## 2022-01-23 LAB — COMPREHENSIVE METABOLIC PANEL
ALT: 20 U/L (ref 0–44)
AST: 23 U/L (ref 15–41)
Albumin: 4.3 g/dL (ref 3.5–5.0)
Alkaline Phosphatase: 92 U/L (ref 38–126)
Anion gap: 9 (ref 5–15)
BUN: 11 mg/dL (ref 6–20)
CO2: 29 mmol/L (ref 22–32)
Calcium: 9.3 mg/dL (ref 8.9–10.3)
Chloride: 100 mmol/L (ref 98–111)
Creatinine, Ser: 0.76 mg/dL (ref 0.44–1.00)
GFR, Estimated: >60 mL/min (ref >60)
Glucose, Bld: 104 mg/dL — ABNORMAL HIGH (ref 70–99)
Potassium: 3.6 mmol/L (ref 3.5–5.1)
Sodium: 138 mmol/L (ref 135–145)
Total Bilirubin: 0.8 mg/dL (ref 0.3–1.2)
Total Protein: 7.1 g/dL (ref 6.5–8.1)

## 2022-01-23 LAB — CBC
HCT: 44.9 % (ref 36.0–46.0)
Hemoglobin: 15 g/dL (ref 12.0–15.0)
MCH: 29.2 pg (ref 26.0–34.0)
MCHC: 33.4 g/dL (ref 30.0–36.0)
MCV: 87.5 fL (ref 80.0–100.0)
Platelets: 279 10*3/uL (ref 150–400)
RBC: 5.13 MIL/uL — ABNORMAL HIGH (ref 3.87–5.11)
RDW: 12.6 % (ref 11.5–15.5)
WBC: 6.5 10*3/uL (ref 4.0–10.5)
nRBC: 0 % (ref 0.0–0.2)

## 2022-01-23 LAB — LIPASE, BLOOD: Lipase: 40 U/L (ref 11–51)

## 2022-01-23 MED ORDER — ONDANSETRON 4 MG PO TBDP
4.0000 mg | ORAL_TABLET | Freq: Once | ORAL | Status: AC
  Administered 2022-01-23: 4 mg via ORAL
  Filled 2022-01-23: qty 1

## 2022-01-23 MED ORDER — OXYCODONE-ACETAMINOPHEN 5-325 MG PO TABS
1.0000 | ORAL_TABLET | Freq: Once | ORAL | Status: AC
  Administered 2022-01-23: 1 via ORAL
  Filled 2022-01-23: qty 1

## 2022-01-23 MED ORDER — HYDROCODONE-ACETAMINOPHEN 5-325 MG PO TABS: 1.0000 | ORAL_TABLET | ORAL | 0 refills | Status: DC | PRN

## 2022-01-23 NOTE — ED Notes (Signed)
Pt complaining of 7/10 abdominal pain. Paduchowski MD made aware

## 2022-01-23 NOTE — ED Notes (Signed)
US at bedside

## 2022-01-23 NOTE — ED Triage Notes (Signed)
Pt to ED from home c/o RUQ x1 week.  Denies n/v/d, denies urinary changes.  States seen by PCP for this with concerns of gallbladder and supposed to have US done today but couldn't stand the pain.  Pt A&Ox4, chest rise even and unlabored, skin WNL and in NAD at this time.

## 2022-01-23 NOTE — ED Provider Notes (Signed)
[facility] Provider Note    Event Date/Time   First MD Initiated Contact with Patient 01/23/22 0239     (approximate)  History   Chief Complaint: Abdominal Pain  HPI  Olivia Hamilton is a 56 y.o. female with a past medical history of depression, hypertension, presents to the emergency department for right upper quadrant abdominal pain.  According to the patient for the past 1 months she has been experiencing pain intermittently to the right upper quadrant although worse over the past 2 days.  Patient saw her PCP and was referred for an ultrasound actually later today however patient states the pain seemed to worsen overnight so she came to the emergency department for evaluation.  Patient denies any known fever.  States nausea but denies any vomiting or diarrhea.  Physical Exam   Triage Vital Signs: ED Triage Vitals [01/23/22 0223]  Enc Vitals Group     BP (!) 157/100     Pulse Rate 80     Resp 16     Temp 98.4 F (36.9 C)     Temp Source Oral     SpO2 97 %     Weight 225 lb (102.1 kg)     Height 5\' 6"  (1.676 m)     Head Circumference      Peak Flow      Pain Score 7     Pain Loc      Pain Edu?      Excl. in GC?     Most recent vital signs: Vitals:   01/23/22 0223  BP: (!) 157/100  Pulse: 80  Resp: 16  Temp: 98.4 F (36.9 C)  SpO2: 97%    General: Awake, no distress.  CV:  Good peripheral perfusion.  Regular rate and rhythm  Resp:  Normal effort.  Equal breath sounds bilaterally.  Abd:  No distention.  Soft, mild right upper quadrant tenderness.  No rebound or guarding    ED Results / Procedures / Treatments   EKG  EKG viewed and interpreted by myself shows a normal sinus rhythm at 72 bpm with a narrow QRS, normal axis, normal intervals, no concerning ST changes.  Reassuring EKG.  RADIOLOGY  I have personally reviewed the ultrasound images appears to have shadowing consistent with stones. Radiology is read the ultrasound as  stones and sludge but no signs of acute cholecystitis   MEDICATIONS ORDERED IN ED: Medications - No data to display   IMPRESSION / MDM / ASSESSMENT AND PLAN / ED COURSE  I reviewed the triage vital signs and the nursing notes.  Patient presents to the emergency department for right upper quadrant abdominal pain x1 week.  Patient states mild pain currently.  Mild tenderness on examination.  Differential would include biliary colic, cholecystitis, gastritis, gastroenteritis, pancreatitis.  Patient's labs have resulted showing a normal lipase, normal LFTs normal bilirubin, normal CBC including normal white blood cell count.  Right upper quadrant ultrasound pending.  Patient's pain is much better after pain medication.  Patient's LFTs are normal.  Chemistry is normal.  Lipase is normal.  CBC is normal with a normal white blood cell count urinalysis is normal as well.  Symptoms are very suggestive of biliary colic.  Patient states she feels better and wishes to go home as she has to go out of town early next week.  I discussed return precautions discussed low-fat diet and discussed pain medication to be used as needed but as written.  Patient  will follow-up with general surgery.  FINAL CLINICAL IMPRESSION(S) / ED DIAGNOSES   Upper abdominal pain Biliary colic  Note:  This document was prepared using Dragon voice recognition software and may include unintentional dictation errors.   Minna Antis, MD 01/23/22 [phone]

## 2022-01-27 ENCOUNTER — Telehealth: Payer: Self-pay | Admitting: Registered Nurse

## 2022-01-27 ENCOUNTER — Encounter: Payer: Self-pay | Admitting: Registered Nurse

## 2022-01-27 DIAGNOSIS — R1084 Generalized abdominal pain: Secondary | ICD-10-CM

## 2022-01-27 DIAGNOSIS — K760 Fatty (change of) liver, not elsewhere classified: Secondary | ICD-10-CM

## 2022-01-27 NOTE — Telephone Encounter (Signed)
Patient contacted NP supposed to travel by air tomorrow to [place], [place] has been undergoing workup for gallbladder stones/abdomen pain by PCM/ED/general surgeon.  Saw Riccardo Dubin surgeon today stated thinks stone may have moved from duct back into gallbladder and acute surgery not needed this week but to consider surgery as second flare up of gallstones first time 2008.  Patient stated she is following bland low fat diet.  Having some abdomen pain all over belly in am upon awakening and wondering what she can do for this pain.  Previously pain typically at night and localized to one side.  Was seen Thursday ED  night 5/18-19 and given hydrocodone acetaminophen Rx.  Took 2 tabs since discharge from ER.  Denied N/V/D/C/hematochezia bright red or black.  Has zantac at home wondering if that would help her stomach pain.  Discussed famotidine (generic zantac) can be taken up to 72m twice a day per up to date and she can also take tums per manufacturer instructions and pick OTC PPI of her choice e.g. omeprazole (pepcid) or esomeprazole (nexium) and take per manufacturer instructions on box.  Discussed avoid large intake of caffeine.  Patient reported she has decreased intake and only having sips to keep caffeine withdrawal headache down.  Discussed avoid wearing tight clothes around waist.  Consider pillows/raising head of bed.  Patient still mulling over gallbladder removal later this month.  Patient reported that due to dietary changes she has been losing weight over the past week.  Denied the need for pain medication today.  Previously was taking motrin daily last week in the evening for abdomen pain.  Discussed with patient NSAIDS can sometimes cause gastritis/inflammation of stomach/intestinal lining.  NSAIDS e.g. motrin/ibuprofen/advil/aleve are used to treat gallbladder pain/biliary colic per up to date. Discussed stop ibuprofen at this time and baby aspirin as can worsen gastritis symptoms.  Tylenol 10053mpo  q6h prn pain if needed.  Reminder her hydrocodone has tylenol in it so would need to decrease dose depending on number of pills she took or delay tylenol only dose by 6 hours. Discussed with patient Rx for hydrocodone tylenol she received from ED was a stronger narcotic medication.  Reviewed lab results and [place] with patient CBC without anemia or elevated WBC.  Renal/liver function and electrolytes WNL  UA without infection and well hydrated.  Lipase WNL  Gallbladder enlarged and stones seen in gallbladder.  Fatty liver also noted on [place] If patient decides to not cancel travel tomorrow I recommended she bring her pain medication hydrocodone, zantac, PPI (pepcid/nexium), zofran, tums, and  tylenol with her.  I recommended she continue her low fat bland diet.  Discussed with patient I could not see her surgeon's note on [facility] and would review Epic in 2 hours when I was at home and contact her if any changes in recommendations after reviewing his note.  Patient verbalized understanding information/instructions, agreed with plan of care and had no further questions at this time.  Latest Reference Range & Units 01/23/22 02:27  Sodium 135 - 145 mmol/L 138  Potassium 3.5 - 5.1 mmol/L 3.6  Chloride 98 - 111 mmol/L 100  CO2 22 - 32 mmol/L 29  Glucose 70 - 99 mg/dL 104 (H)  BUN 6 - 20 mg/dL 11  Creatinine 0.44 - 1.00 mg/dL 0.76  Calcium 8.9 - 10.3 mg/dL 9.3  Anion gap 5 - 15  9  Alkaline Phosphatase 38 - 126 U/L 92  Albumin 3.5 - 5.0 g/dL 4.3  Lipase 11 - 51 U/L 40  AST 15 - 41 U/L 23  ALT 0 - 44 U/L 20  Total Protein 6.5 - 8.1 g/dL 7.1  Total Bilirubin 0.3 - 1.2 mg/dL 0.8  GFR, Estimated >60 mL/min >60  (H): Data is abnormally high  Latest Reference Range & Units 01/23/22 02:27  WBC 4.0 - 10.5 K/uL 6.5  RBC 3.87 - 5.11 MIL/uL 5.13 (H)  Hemoglobin 12.0 - 15.0 g/dL 15.0  HCT 36.0 - 46.0 % 44.9  MCV 80.0 - 100.0 fL 87.5  MCH 26.0 - 34.0 pg 29.2  MCHC 30.0 - 36.0 g/dL 33.4  RDW 11.5 - 15.5  % 12.6  Platelets 150 - 400 K/uL 279  nRBC 0.0 - 0.2 % 0.0  Glucose 70 - 99 mg/dL 104 (H)  (H): Data is abnormally high   Latest Reference Range & Units 01/20/22 10:25 01/23/22 02:42  URINALYSIS, ROUTINE W REFLEX MICROSCOPIC   Rpt !  Appearance CLEAR   CLEAR !  Bilirubin Urine NEGATIVE   NEGATIVE  Bilirubin, UA  Negative   Color, Urine YELLOW   STRAW !  Glucose Negative  Negative   Glucose, UA NEGATIVE mg/dL  NEGATIVE  Hgb urine dipstick NEGATIVE   SMALL !  Ketones, UA  Negative   Ketones, ur NEGATIVE mg/dL  NEGATIVE  Leukocytes,UA Negative  Negative   Leukocytes,Ua NEGATIVE   NEGATIVE  Nitrite NEGATIVE   NEGATIVE  Nitrite, UA  Negative   pH 5.0 - 8.0   6.0  pH, UA 5.0 - 8.0  5.0   Protein NEGATIVE mg/dL  NEGATIVE  Protein,UA Negative  Negative   Specific Gravity, UA 1.010 - 1.025  1.015   Specific Gravity, Urine 1.005 - 1.030   1.004 (L)  Urobilinogen, UA 0.2 or 1.0 E.U./dL 0.2   Bacteria, UA NONE SEEN   NONE SEEN  RBC / HPF 0 - 5 RBC/hpf  0-5  RBC, UA  Negative   Squamous Epithelial / LPF 0 - 5   0-5  WBC, UA 0 - 5 WBC/hpf  0-5  !: Data is abnormal (L): Data is abnormally low Rpt: View report in Results Review for more information  Latest Reference Range & Units 01/20/22 10:16  Sodium 135 - 146 mmol/L 140  Potassium 3.5 - 5.3 mmol/L 4.0  Chloride 98 - 110 mmol/L 99  CO2 20 - 32 mmol/L 30  Glucose 65 - 99 mg/dL 81  BUN 7 - 25 mg/dL 12  Creatinine 0.50 - 1.03 mg/dL 0.94  Calcium 8.6 - 10.4 mg/dL 10.0  BUN/Creatinine Ratio 6 - 22 (calc) NOT APPLICABLE  eGFR > OR = 60 mL/min/1.60m 71  AG Ratio 1.0 - 2.5 (calc) 1.8  Amylase, Serum 21 - 101 U/L 32  Lipase 7 - 60 U/L 21  AST 10 - 35 U/L 25  ALT 6 - 29 U/L 20  Total Protein 6.1 - 8.1 g/dL 6.9  Total Bilirubin 0.2 - 1.2 mg/dL 0.7  Alkaline phosphatase (APISO) 37 - 153 U/L 96  Globulin 1.9 - 3.7 g/dL (calc) 2.5  WBC 3.8 - 10.8 Thousand/uL 7.1  RBC 3.80 - 5.10 Million/uL 5.36 (H)  Hemoglobin 11.7 - 15.5 g/dL 16.3 (H)   HCT 35.0 - 45.0 % 48.2 (H)  MCV 80.0 - 100.0 fL 89.9  MCH 27.0 - 33.0 pg 30.4  MCHC 32.0 - 36.0 g/dL 33.8  RDW 11.0 - 15.0 % 13.0  Platelets 140 - 400 Thousand/uL 316  MPV 7.5 - 12.5 fL 9.9  (H):  Data is abnormally high  Latest Reference Range & Units 01/20/22 10:16 01/23/22 02:27  Lipase 11 - 51 U/L 21 40    CLINICAL DATA:  56 year old female presenting with history of right upper quadrant abdominal pain for 1 week.   EXAM: ULTRASOUND ABDOMEN LIMITED RIGHT UPPER QUADRANT   COMPARISON:  No priors.   FINDINGS: Gallbladder:   There is a combination of amorphous echogenic nonshadowing material lying dependently in the gallbladder, as well as a large echogenic foci with posterior acoustic shadowing in the neck of the gallbladder, compatible with a combination of biliary sludge and a gallstone. Gallbladder is moderately distended. Gallbladder wall thickness is normal at 3 mm. No pericholecystic fluid. Per report from the sonographer, there was no sonographic Murphy's sign on examination.   Common bile duct:   Diameter: 4 mm   Liver:   No focal lesion identified. Mild diffuse increased hepatic echogenicity. Portal vein is patent on color Doppler imaging with normal direction of blood flow towards the liver.   Other: None.   IMPRESSION: 1. Study is positive for cholelithiasis and biliary sludge in the gallbladder, without definitive sonographic findings to suggest an acute cholecystitis at this time. 2. Hepatic steatosis.     Electronically Signed   By: Vinnie Langton M.D.   On: 01/23/2022 05:08

## 2022-01-28 NOTE — Telephone Encounter (Signed)
Late entry Patient notified I reviewed surgeon's note and no further instructions at this time or change to plan of care already discussed 01/27/22 at 2053

## 2022-01-28 NOTE — Telephone Encounter (Signed)
Follow up with patient to see if she traveled or stayed home.  Patient opted to continue travel this week and will be returning home 02 Feb 2022.  Patient reported she stopped nightly aspire drink and continuing low fat/bland diet and taking zantac and all of these measures have helped to reduce pain/stomach issues.  Patient denied further questions or concerns at this time.

## 2022-02-05 HISTORY — PX: CHOLECYSTECTOMY: SHX55

## 2022-02-10 ENCOUNTER — Encounter
Admission: RE | Admit: 2022-02-10 | Discharge: 2022-02-10 | Disposition: A | Discharge status: Home / Self Care | Payer: No Typology Code available for payment source | Source: Ambulatory Visit | Attending: General Surgery | Admitting: General Surgery

## 2022-02-10 ENCOUNTER — Ambulatory Visit: Payer: Self-pay | Admitting: General Surgery

## 2022-02-10 MED ORDER — LACTATED RINGERS IV SOLN: INTRAVENOUS | Status: DC

## 2022-02-10 MED ORDER — CEFAZOLIN SODIUM-DEXTROSE 2-4 GM/100ML-% IV SOLN
2.0000 g | INTRAVENOUS | Status: AC
  Administered 2022-02-11: 2 g via INTRAVENOUS

## 2022-02-10 MED ORDER — ORAL CARE MOUTH RINSE: 15.0000 mL | Freq: Once | OROMUCOSAL | Status: AC

## 2022-02-10 MED ORDER — INDOCYANINE GREEN 25 MG IV SOLR
1.2500 mg | Freq: Once | INTRAVENOUS | Status: AC
  Administered 2022-02-11: 1.25 mg via INTRAVENOUS
  Filled 2022-02-10: qty 0.5

## 2022-02-10 MED ORDER — CHLORHEXIDINE GLUCONATE 0.12 % MT SOLN: 15.0000 mL | Freq: Once | OROMUCOSAL | Status: AC

## 2022-02-10 MED ORDER — FAMOTIDINE 20 MG PO TABS: 20.0000 mg | ORAL_TABLET | Freq: Once | ORAL | Status: AC

## 2022-02-10 NOTE — Patient Instructions (Addendum)
Your procedure is scheduled on: Wednesday, June 7 Report to the Registration Desk on the 1st floor of the [facility] To find out your arrival time, please call [phone] between 1PM - 3PM on: Tuesday, June 6 If your arrival time is 6:00 am, do not arrive prior to that time as the [facility] entrance doors do not open until 6:00 am.  REMEMBER: Instructions that are not followed completely may result in serious medical risk, up to and including death; or upon the discretion of your surgeon and anesthesiologist your surgery may need to be rescheduled.  Do not eat food after midnight the night before surgery.  No gum chewing, lozengers or hard candies.  You may however, drink CLEAR liquids up to 2 hours before you are scheduled to arrive for your surgery. Do not drink anything within 2 hours of your scheduled arrival time.  Clear liquids include: - water  - apple juice without pulp - gatorade (not RED colors) - black coffee or tea (Do NOT add milk or creamers to the coffee or tea) Do NOT drink anything that is not on this list.  TAKE THESE MEDICATIONS THE MORNING OF SURGERY WITH A SIP OF WATER:  Albuterol inhaler Hydrocodone if needed for pain Levothyroxine Liothyronine  Use inhalers on the day of surgery and bring to the hospital.  One week prior to surgery: Stop aspirin and Anti-inflammatories (NSAIDS) such as Advil, Aleve, Ibuprofen, Motrin, Naproxen, Naprosyn and Aspirin based products such as Excedrin, Goodys Powder, BC Powder. Stop ANY OVER THE COUNTER supplements until after surgery. Stop vitamin C, calcium, vitamin D You may however, continue to take Tylenol if needed for pain up until the day of surgery.  No Alcohol for 24 hours before or after surgery.  No Smoking including e-cigarettes for 24 hours prior to surgery.  No chewable tobacco products for at least 6 hours prior to surgery.  No nicotine patches on the day of surgery.  Do not use any "recreational"  drugs for at least a week prior to your surgery.  Please be advised that the combination of cocaine and anesthesia may have negative outcomes, up to and including death. If you test positive for cocaine, your surgery will be cancelled.  On the morning of surgery brush your teeth with toothpaste and water, you may rinse your mouth with mouthwash if you wish. Do not swallow any toothpaste or mouthwash.  Use CHG Soap as directed on instruction sheet.  Do not wear jewelry, make-up, hairpins, clips or nail polish.  Do not wear lotions, powders, or perfumes.   Do not shave body from the neck down 48 hours prior to surgery just in case you cut yourself which could leave a site for infection.  Also, freshly shaved skin may become irritated if using the CHG soap.  Contact lenses, hearing aids and dentures may not be worn into surgery.  Do not bring valuables to the hospital. [facility] is not responsible for any missing/lost belongings or valuables.   Notify your doctor if there is any change in your medical condition (cold, fever, infection).  Wear comfortable clothing (specific to your surgery type) to the hospital.  After surgery, you can help prevent lung complications by doing breathing exercises.  Take deep breaths and cough every 1-2 hours. Your doctor may order a device called an Incentive Spirometer to help you take deep breaths. When coughing or sneezing, hold a pillow firmly against your incision with both hands. This is called "splinting." Doing  this helps protect your incision. It also decreases belly discomfort.  If you are being discharged the day of surgery, you will not be allowed to drive home. You will need a responsible adult (18 years or older) to drive you home and stay with you that night.   If you are taking public transportation, you will need to have a responsible adult (18 years or older) with you. Please confirm with your physician that it is acceptable to use  public transportation.   Please call the Pre-admissions Testing Dept. at [phone] if you have any questions about these instructions.  Surgery Visitation Policy:  Patients undergoing a surgery or procedure may have two family members or support persons with them as long as the person is not COVID-19 positive or experiencing its symptoms.    Preparing for Surgery with CHLORHEXIDINE GLUCONATE (CHG) Soap    Before surgery, you can play an important role by reducing the number of germs on your skin.  CHG (Chlorhexidine gluconate) soap is an antiseptic cleanser which kills germs and bonds with the skin to continue killing germs even after washing.  Please do not use if you have an allergy to CHG or antibacterial soaps. If your skin becomes reddened/irritated stop using the CHG.  1. Shower the NIGHT BEFORE SURGERY and the MORNING OF SURGERY with CHG soap.  2. If you choose to wash your hair, wash your hair first as usual with your normal shampoo.  3. After shampooing, rinse your hair and body thoroughly to remove the shampoo.  4. Use CHG as you would any other liquid soap. You can apply CHG directly to the skin and wash gently with a scrungie or a clean washcloth.  5. Apply the CHG soap to your body only from the neck down. Do not use on open wounds or open sores. Avoid contact with your eyes, ears, mouth, and genitals (private parts). Wash face and genitals (private parts) with your normal soap.  6. Wash thoroughly, paying special attention to the area where your surgery will be performed.  7. Thoroughly rinse your body with warm water.  8. Do not shower/wash with your normal soap after using and rinsing off the CHG soap.  9. Pat yourself dry with a clean towel.  10. Wear clean pajamas to bed the night before surgery.  12. Place clean sheets on your bed the night of your first shower and do not sleep with pets.  13. Shower again with the CHG soap on the day of surgery prior  to arriving at the hospital.  14. Do not apply any deodorants/lotions/powders.  15. Please wear clean clothes to the hospital.

## 2022-02-10 NOTE — Telephone Encounter (Signed)
Late entry. Spoke with pt yesterday and she reported continued sx over the weekend and that day. She has opted to move forward with lap chole and this is scheduled for 02/11/22. Surgeon told her she should be able to plan to RTW 02/16/22. Pt continues with bland diet and Rx'd meds for sx control while awaiting surgery date.

## 2022-02-11 ENCOUNTER — Encounter
Admission: RE | Disposition: A | Discharge status: Home / Self Care | Payer: Self-pay | Source: Home / Self Care | Attending: General Surgery

## 2022-02-11 ENCOUNTER — Ambulatory Visit: Payer: No Typology Code available for payment source | Admitting: Anesthesiology

## 2022-02-11 ENCOUNTER — Other Ambulatory Visit: Payer: Self-pay

## 2022-02-11 ENCOUNTER — Ambulatory Visit
Admission: RE | Admit: 2022-02-11 | Discharge: 2022-02-11 | Disposition: A | Discharge status: Home / Self Care | Payer: No Typology Code available for payment source | Attending: General Surgery | Admitting: General Surgery

## 2022-02-11 ENCOUNTER — Encounter: Payer: Self-pay | Admitting: General Surgery

## 2022-02-11 DIAGNOSIS — E039 Hypothyroidism, unspecified: Secondary | ICD-10-CM | POA: Insufficient documentation

## 2022-02-11 DIAGNOSIS — I10 Essential (primary) hypertension: Secondary | ICD-10-CM | POA: Diagnosis not present

## 2022-02-11 DIAGNOSIS — K8010 Calculus of gallbladder with chronic cholecystitis without obstruction: Secondary | ICD-10-CM | POA: Diagnosis not present

## 2022-02-11 SURGERY — CHOLECYSTECTOMY, ROBOT-ASSISTED, LAPAROSCOPIC
Anesthesia: General | Site: Abdomen

## 2022-02-11 MED ORDER — CHLORHEXIDINE GLUCONATE 0.12 % MT SOLN
OROMUCOSAL | Status: AC
  Administered 2022-02-11: 15 mL via OROMUCOSAL
  Filled 2022-02-11: qty 15

## 2022-02-11 MED ORDER — LACTATED RINGERS IV SOLN: INTRAVENOUS | Status: DC | PRN

## 2022-02-11 MED ORDER — PHENYLEPHRINE HCL-NACL 20-0.9 MG/250ML-% IV SOLN
INTRAVENOUS | Status: DC | PRN
  Administered 2022-02-11: 50 ug/min via INTRAVENOUS

## 2022-02-11 MED ORDER — FENTANYL CITRATE (PF) 100 MCG/2ML IJ SOLN
INTRAMUSCULAR | Status: AC
  Filled 2022-02-11: qty 2

## 2022-02-11 MED ORDER — HYDROMORPHONE HCL 1 MG/ML IJ SOLN
INTRAMUSCULAR | Status: DC | PRN
  Administered 2022-02-11: 1 mg via INTRAVENOUS

## 2022-02-11 MED ORDER — PHENYLEPHRINE HCL (PRESSORS) 10 MG/ML IV SOLN
INTRAVENOUS | Status: DC | PRN
  Administered 2022-02-11: 160 ug via INTRAVENOUS

## 2022-02-11 MED ORDER — OXYCODONE HCL 5 MG PO TABS
ORAL_TABLET | ORAL | Status: AC
  Filled 2022-02-11: qty 1

## 2022-02-11 MED ORDER — BUPIVACAINE-EPINEPHRINE (PF) 0.25% -1:200000 IJ SOLN
INTRAMUSCULAR | Status: AC
  Filled 2022-02-11: qty 30

## 2022-02-11 MED ORDER — BUPIVACAINE-EPINEPHRINE 0.25% -1:200000 IJ SOLN
INTRAMUSCULAR | Status: DC | PRN
  Administered 2022-02-11: 30 mL

## 2022-02-11 MED ORDER — HYDROMORPHONE HCL 1 MG/ML IJ SOLN
INTRAMUSCULAR | Status: AC
  Filled 2022-02-11: qty 1

## 2022-02-11 MED ORDER — 0.9 % SODIUM CHLORIDE (POUR BTL) OPTIME
TOPICAL | Status: DC | PRN
  Administered 2022-02-11: 200 mL

## 2022-02-11 MED ORDER — EPHEDRINE SULFATE (PRESSORS) 50 MG/ML IJ SOLN
INTRAMUSCULAR | Status: DC | PRN
  Administered 2022-02-11: 5 mg via INTRAVENOUS

## 2022-02-11 MED ORDER — FENTANYL CITRATE (PF) 100 MCG/2ML IJ SOLN
INTRAMUSCULAR | Status: AC
  Administered 2022-02-11: 25 ug via INTRAVENOUS
  Filled 2022-02-11: qty 2

## 2022-02-11 MED ORDER — ONDANSETRON HCL 4 MG/2ML IJ SOLN: 4.0000 mg | Freq: Once | INTRAMUSCULAR | Status: DC | PRN

## 2022-02-11 MED ORDER — FENTANYL CITRATE (PF) 100 MCG/2ML IJ SOLN
25.0000 ug | INTRAMUSCULAR | Status: DC | PRN
  Administered 2022-02-11: 25 ug via INTRAVENOUS
  Administered 2022-02-11: 50 ug via INTRAVENOUS

## 2022-02-11 MED ORDER — FENTANYL CITRATE (PF) 100 MCG/2ML IJ SOLN
INTRAMUSCULAR | Status: DC | PRN
  Administered 2022-02-11: 100 ug via INTRAVENOUS

## 2022-02-11 MED ORDER — ROCURONIUM BROMIDE 100 MG/10ML IV SOLN
INTRAVENOUS | Status: DC | PRN
  Administered 2022-02-11: 20 mg via INTRAVENOUS
  Administered 2022-02-11: 50 mg via INTRAVENOUS

## 2022-02-11 MED ORDER — PROPOFOL 10 MG/ML IV BOLUS
INTRAVENOUS | Status: DC | PRN
  Administered 2022-02-11: 150 mg via INTRAVENOUS

## 2022-02-11 MED ORDER — "VISTASEAL 4 ML SINGLE DOSE KIT "
PACK | CUTANEOUS | Status: DC | PRN
  Administered 2022-02-11: 4 mL via TOPICAL

## 2022-02-11 MED ORDER — ACETAMINOPHEN 10 MG/ML IV SOLN
INTRAVENOUS | Status: AC
  Filled 2022-02-11: qty 100

## 2022-02-11 MED ORDER — FAMOTIDINE 20 MG PO TABS
ORAL_TABLET | ORAL | Status: AC
  Administered 2022-02-11: 20 mg via ORAL
  Filled 2022-02-11: qty 1

## 2022-02-11 MED ORDER — MIDAZOLAM HCL 2 MG/2ML IJ SOLN
INTRAMUSCULAR | Status: AC
  Filled 2022-02-11: qty 2

## 2022-02-11 MED ORDER — MIDAZOLAM HCL 2 MG/2ML IJ SOLN
INTRAMUSCULAR | Status: DC | PRN
  Administered 2022-02-11: 2 mg via INTRAVENOUS

## 2022-02-11 MED ORDER — ACETAMINOPHEN 10 MG/ML IV SOLN
1000.0000 mg | Freq: Once | INTRAVENOUS | Status: DC | PRN
  Administered 2022-02-11: 1000 mg via INTRAVENOUS

## 2022-02-11 MED ORDER — OXYCODONE HCL 5 MG PO TABS
5.0000 mg | ORAL_TABLET | Freq: Once | ORAL | Status: AC | PRN
  Administered 2022-02-11: 5 mg via ORAL

## 2022-02-11 MED ORDER — OXYCODONE HCL 5 MG/5ML PO SOLN: 5.0000 mg | Freq: Once | ORAL | Status: AC | PRN

## 2022-02-11 MED ORDER — SUGAMMADEX SODIUM 500 MG/5ML IV SOLN
INTRAVENOUS | Status: DC | PRN
  Administered 2022-02-11: 200 mg via INTRAVENOUS

## 2022-02-11 MED ORDER — CEFAZOLIN SODIUM-DEXTROSE 2-4 GM/100ML-% IV SOLN
INTRAVENOUS | Status: AC
  Filled 2022-02-11: qty 100

## 2022-02-11 MED ORDER — TRAMADOL HCL 50 MG PO TABS: 50.0000 mg | ORAL_TABLET | Freq: Four times a day (QID) | ORAL | 0 refills | Status: DC | PRN

## 2022-02-11 SURGICAL SUPPLY — 54 items
ADH SKN CLS APL DERMABOND .7 (GAUZE/BANDAGES/DRESSINGS) ×1
APL LAPSCP 35 DL APL RGD (MISCELLANEOUS) ×1
APPLICATOR VISTASEAL 35 (MISCELLANEOUS) ×1 IMPLANT
BAG RETRIEVAL 10 (BASKET) ×1
BLADE SURG SZ11 CARB STEEL (BLADE) ×2 IMPLANT
CANNULA REDUC XI 12-8 STAPL (CANNULA) ×2
CANNULA REDUCER 12-8 DVNC XI (CANNULA) ×1 IMPLANT
CLIP LIGATING HEMO O LOK GREEN (MISCELLANEOUS) ×2 IMPLANT
DERMABOND ADVANCED (GAUZE/BANDAGES/DRESSINGS) ×1
DERMABOND ADVANCED .7 DNX12 (GAUZE/BANDAGES/DRESSINGS) ×1 IMPLANT
DRAPE ARM DVNC X/XI (DISPOSABLE) ×4 IMPLANT
DRAPE COLUMN DVNC XI (DISPOSABLE) ×1 IMPLANT
DRAPE DA VINCI XI ARM (DISPOSABLE) ×8
DRAPE DA VINCI XI COLUMN (DISPOSABLE) ×2
ELECT CAUTERY BLADE 6.4 (BLADE) ×1 IMPLANT
ELECT REM PT RETURN 9FT ADLT (ELECTROSURGICAL) ×2
ELECTRODE REM PT RTRN 9FT ADLT (ELECTROSURGICAL) ×1 IMPLANT
GLOVE BIO SURGEON STRL SZ 6.5 (GLOVE) ×6 IMPLANT
GLOVE BIOGEL PI IND STRL 6.5 (GLOVE) ×2 IMPLANT
GLOVE BIOGEL PI INDICATOR 6.5 (GLOVE) ×4
GOWN STRL REUS W/ TWL LRG LVL3 (GOWN DISPOSABLE) ×3 IMPLANT
GOWN STRL REUS W/TWL LRG LVL3 (GOWN DISPOSABLE) ×8
GRASPER SUT TROCAR 14GX15 (MISCELLANEOUS) ×2 IMPLANT
IRRIGATOR SUCT 8 DISP DVNC XI (IRRIGATION / IRRIGATOR) IMPLANT
IRRIGATOR SUCTION 8MM XI DISP (IRRIGATION / IRRIGATOR)
IV NS 1000ML (IV SOLUTION)
IV NS 1000ML BAXH (IV SOLUTION) IMPLANT
KIT PINK PAD W/HEAD ARE REST (MISCELLANEOUS) ×2
KIT PINK PAD W/HEAD ARM REST (MISCELLANEOUS) ×1 IMPLANT
LABEL OR SOLS (LABEL) ×2 IMPLANT
MANIFOLD NEPTUNE II (INSTRUMENTS) ×1 IMPLANT
NDL INSUFFLATION 14GA 120MM (NEEDLE) ×1 IMPLANT
NEEDLE HYPO 22GX1.5 SAFETY (NEEDLE) ×2 IMPLANT
NEEDLE INSUFFLATION 14GA 120MM (NEEDLE) ×2 IMPLANT
NS IRRIG 500ML POUR BTL (IV SOLUTION) ×2 IMPLANT
OBTURATOR OPTICAL STANDARD 8MM (TROCAR) ×2
OBTURATOR OPTICAL STND 8 DVNC (TROCAR) ×1
OBTURATOR OPTICALSTD 8 DVNC (TROCAR) ×1 IMPLANT
PACK LAP CHOLECYSTECTOMY (MISCELLANEOUS) ×2 IMPLANT
PENCIL ELECTRO HAND CTR (MISCELLANEOUS) ×1 IMPLANT
SEAL CANN UNIV 5-8 DVNC XI (MISCELLANEOUS) ×3 IMPLANT
SEAL XI 5MM-8MM UNIVERSAL (MISCELLANEOUS) ×8
SET TUBE SMOKE EVAC HIGH FLOW (TUBING) ×2 IMPLANT
SOLUTION ELECTROLUBE (MISCELLANEOUS) ×2 IMPLANT
SPONGE T-LAP 4X18 ~~LOC~~+RFID (SPONGE) ×1 IMPLANT
STAPLER CANNULA SEAL DVNC XI (STAPLE) ×1 IMPLANT
STAPLER CANNULA SEAL XI (STAPLE) ×2
SUT MNCRL 4-0 (SUTURE) ×4
SUT MNCRL 4-0 27XMFL (SUTURE) ×2
SUT VICRYL 0 AB UR-6 (SUTURE) ×2 IMPLANT
SUTURE MNCRL 4-0 27XMF (SUTURE) ×1 IMPLANT
SYS BAG RETRIEVAL 10MM (BASKET) ×1
SYSTEM BAG RETRIEVAL 10MM (BASKET) ×1 IMPLANT
WATER STERILE IRR 500ML POUR (IV SOLUTION) ×2 IMPLANT

## 2022-02-11 NOTE — Anesthesia Postprocedure Evaluation (Signed)
Anesthesia Post Note  Patient: Olivia Hamilton  Procedure(s) Performed: XI ROBOTIC ASSISTED LAPAROSCOPIC CHOLECYSTECTOMY (Abdomen) INDOCYANINE GREEN FLUORESCENCE IMAGING (ICG) (Abdomen)  Patient location during evaluation: PACU Anesthesia Type: General Level of consciousness: awake and alert Pain management: pain level controlled Vital Signs Assessment: post-procedure vital signs reviewed and stable Respiratory status: spontaneous breathing, nonlabored ventilation, respiratory function stable and patient connected to nasal cannula oxygen Cardiovascular status: blood pressure returned to baseline and stable Postop Assessment: no apparent nausea or vomiting Anesthetic complications: no   No notable events documented.   Last Vitals:  Vitals:   02/11/22 1345 02/11/22 1411  BP: (!) 151/87 (!) 156/87  Pulse: 60 61  Resp: 16 18  Temp: 36.6 C (!) 36.4 C  SpO2: 99% 97%    Last Pain:  Vitals:   02/11/22 1411  TempSrc: Temporal  PainSc: 5                  Arita Miss

## 2022-02-11 NOTE — Transfer of Care (Signed)
Immediate Anesthesia Transfer of Care Note  Patient: Olivia Hamilton  Procedure(s) Performed: XI ROBOTIC ASSISTED LAPAROSCOPIC CHOLECYSTECTOMY (Abdomen) INDOCYANINE GREEN FLUORESCENCE IMAGING (ICG) (Abdomen)  Patient Location: PACU  Anesthesia Type:General  Level of Consciousness: drowsy  Airway & Oxygen Therapy: Patient Spontanous Breathing and Patient connected to face mask oxygen  Post-op Assessment: Report given to RN and Post -op Vital signs reviewed and stable  Post vital signs: Reviewed and stable  Last Vitals:  Vitals Value Taken Time  BP 154/83 02/11/22 1256  Temp 98   Pulse 57 02/11/22 1301  Resp 25 02/11/22 1301  SpO2 100 % 02/11/22 1301  Vitals shown include unvalidated device data.  Last Pain:  Vitals:   02/11/22 1029  TempSrc: Oral  PainSc: 0-No pain         Complications: No notable events documented.

## 2022-02-11 NOTE — Discharge Instructions (Addendum)
  Diet: Resume home heart healthy regular diet.   Activity: No heavy lifting >20 pounds (children, pets, laundry, garbage) or strenuous activity until follow-up, but light activity and walking are encouraged. Do not drive or drink alcohol if taking narcotic pain medications.  Wound care: May shower with soapy water and pat dry (do not rub incisions), but no baths or submerging incision underwater until follow-up. (no swimming)   Medications: Resume all home medications. For mild to moderate pain: acetaminophen (Tylenol) or ibuprofen (if no kidney disease). Combining Tylenol with alcohol can substantially increase your risk of causing liver disease. Narcotic pain medications, if prescribed, can be used for severe pain, though may cause nausea, constipation, and drowsiness. If you do not need the narcotic pain medication, you do not need to fill the prescription.  Call office ([phone]) at any time if any questions, worsening pain, fevers/chills, bleeding, drainage from incision site, or other concerns.   AMBULATORY SURGERY  DISCHARGE INSTRUCTIONS   The drugs that you were given will stay in your system until tomorrow so for the next 24 hours you should not:  Drive an automobile Make any legal decisions Drink any alcoholic beverage   You may resume regular meals tomorrow.  Today it is better to start with liquids and gradually work up to solid foods.  You may eat anything you prefer, but it is better to start with liquids, then soup and crackers, and gradually work up to solid foods.   Please notify your doctor immediately if you have any unusual bleeding, trouble breathing, redness and pain at the surgery site, drainage, fever, or pain not relieved by medication.    Your post-operative visit with Dr.                                       is: Date:                        Time:    Please call to schedule your post-operative visit.  Additional Instructions: 

## 2022-02-11 NOTE — Telephone Encounter (Signed)
Epic and RN Rolly Salter note reviewed.  Laparoscopic cholecystectomy completed patient being transferred to PACU at this time  Will follow up with patient via telephone tomorrow.

## 2022-02-11 NOTE — Anesthesia Procedure Notes (Signed)
Procedure Name: Intubation Date/Time: 02/11/2022 11:12 AM Performed by: Philbert Riser, CRNA Pre-anesthesia Checklist: Patient identified, Patient being monitored, Timeout performed, Emergency Drugs available and Suction available Patient Re-evaluated:Patient Re-evaluated prior to induction Oxygen Delivery Method: Circle system utilized Preoxygenation: Pre-oxygenation with 100% oxygen Induction Type: IV induction Ventilation: Mask ventilation without difficulty Laryngoscope Size: 3 and McGraph Grade View: Grade I Tube type: Oral Tube size: 6.5 mm Number of attempts: 1 Airway Equipment and Method: Stylet Placement Confirmation: ETT inserted through vocal cords under direct vision, positive ETCO2 and breath sounds checked- equal and bilateral Secured at: 21 cm Tube secured with: Tape Dental Injury: Teeth and Oropharynx as per pre-operative assessment

## 2022-02-11 NOTE — H&P (Signed)
PATIENT PROFILE: Olivia Hamilton is a 56 y.o. female who presents to the Clinic for consultation at the request of Dr. Lenard [name] for evaluation of cholelithiasis.  PCP: Nicki Reaper, NP  HISTORY OF PRESENT ILLNESS: Ms. Hinds reports he has been having upper abdominal pain since years ago. She endorses that the pain is mainly in the upper abdomen. Pain radiates to both side of the upper abdomen. Pain is aggravated by eating. There has been elevating factors. She denies any fever or chills.  The patient went to the ED on 01/23/2022 due to the severity of the pain. At the ED she had an ultrasound of the abdomen that shows cholelithiasis and sludge. No sign of cholecystitis. She had normal white blood cell count. Normal electrolytes, normal AST/ALT and bilirubin. I personally evaluated the images of the ultrasound.  Since last evaluation, patient endorses that she continue having upper abdominal pain despite trying to keep a healthy diet.   PROBLEM LIST: Cholelithiasis  GENERAL REVIEW OF SYSTEMS:   General ROS: negative for - chills, fatigue, fever, weight gain or weight loss Allergy and Immunology ROS: negative for - hives  Hematological and Lymphatic ROS: negative for - bleeding problems or bruising, negative for palpable nodes Endocrine ROS: negative for - heat or cold intolerance, hair changes Respiratory ROS: negative for - cough, shortness of breath or wheezing Cardiovascular ROS: no chest pain or palpitations GI ROS: negative for nausea, vomiting, positive for abdominal pain Musculoskeletal ROS: negative for - joint swelling or muscle pain Neurological ROS: negative for - confusion, syncope Dermatological ROS: negative for pruritus and rash Psychiatric: negative for anxiety, depression, difficulty sleeping and memory loss  MEDICATIONS: Current Outpatient Medications  Medication Sig Dispense Refill   albuterol 90 mcg/actuation inhaler Inhale into the lungs as needed    ascorbic acid, vitamin C, (VITAMIN C) 1000 MG tablet Take by mouth once daily   ergocalciferol, vitamin D2, 1,250 mcg (50,000 unit) capsule once a week   estradioL (ESTRACE) 0.01 % (0.1 mg/gram) vaginal cream Place vaginally once daily   fluocinolone (DERMA-SMOOTHE) 0.01 % external oil as needed   fluticasone propionate (FLONASE) 50 mcg/actuation nasal spray Place into one nostril as needed   hydroCHLOROthiazide (HYDRODIURIL) 25 MG tablet Take 1 tablet by mouth once daily   HYDROcodone-acetaminophen (NORCO) 5-325 mg tablet Take by mouth as needed   levothyroxine (SYNTHROID) 88 MCG tablet once daily   liothyronine (CYTOMEL) 25 MCG tablet as directed   loratadine (CLARITIN) 10 mg tablet Take 10 mg by mouth once daily   progesterone (PROMETRIUM) 100 MG capsule Take by mouth once daily   sodium chloride (AYR) 0.65 % nasal drops Place into one nostril as needed   vitamin A palmitate-vitamin D2 10,000-400 unit Tab Take by mouth once daily   No current facility-administered medications for this visit.   ALLERGIES: Sulfur dioxide, Casein, and Gluten  PAST MEDICAL HISTORY: Past Medical History:  Diagnosis Date   HTN (hypertension)   Thyroid disease   PAST SURGICAL HISTORY: Past Surgical History:  Procedure Laterality Date   REMOVAL OVARIAN CYST    FAMILY HISTORY: Family History  Problem Relation Age of Onset   High blood pressure (Hypertension) Mother   Lung cancer Father   Breast cancer Sister    SOCIAL HISTORY: Social History   Socioeconomic History   Marital status: Legally Separated  Tobacco Use   Smoking status: Never   Smokeless tobacco: Never  Vaping Use   Vaping Use: Never used  Substance and Sexual Activity  Alcohol use: Yes  Comment: occasionally   Drug use: Never   PHYSICAL EXAM: Vitals:  01/27/22 1525  BP: (!) 131/98  Pulse: 105   Body mass index is 36.05 kg/m. Weight: 99.8 kg (220 lb) (Per chart)   GENERAL: Alert, active, oriented x3  HEENT:  Pupils equal reactive to light. Extraocular movements are intact. Sclera clear. Palpebral conjunctiva normal red color.Pharynx clear.  NECK: Supple with no palpable mass and no adenopathy.  LUNGS: Sound clear with no rales rhonchi or wheezes.  HEART: Regular rhythm S1 and S2 without murmur.  ABDOMEN: Soft and depressible, nontender with no palpable mass, no hepatomegaly.   EXTREMITIES: Well-developed well-nourished symmetrical with no dependent edema.  NEUROLOGICAL: Awake alert oriented, facial expression symmetrical, moving all extremities.  REVIEW OF DATA: I have reviewed the following data today: No results found for any previous visit.    ASSESSMENT: Ms. Meares is a 56 y.o. female presenting for consultation for cholelithiasis.   Patient was oriented about the diagnosis of cholelithiasis. Also oriented about what is the gallbladder, its anatomy and function and the implications of having stones. The patient was oriented about the treatment alternatives (observation vs cholecystectomy). Patient was oriented that a low percentage of patient will continue to have similar pain symptoms even after the gallbladder is removed. Surgical technique (open vs laparoscopic) was discussed. It was also discussed the goals of the surgery (decrease the pain episodes and avoid the risk of cholecystitis) and the risk of surgery including: bleeding, infection, common bile duct injury, stone retention, injury to other organs such as bowel, liver, stomach, other complications such as hernia, bowel obstruction among others. Also discussed with patient about anesthesia and its complications such as: reaction to medications, pneumonia, heart complications, death, among others.   Patient has decided to proceed with cholecystectomy  Cholelithiasis without cholecystitis [K80.20]  PLAN: 1. Robotic assisted laparoscopic cholecystectomy  Patient verbalized understanding, all questions were answered, and were  agreeable with the plan outlined above.   Carolan Shiver, MD

## 2022-02-11 NOTE — Op Note (Signed)
Preoperative diagnosis: Cholelithiasis  Postoperative diagnosis: Acute over chronic cholecystitis  Procedure: Robotic Assisted Laparoscopic Cholecystectomy.   Anesthesia: GETA   Surgeon: Dr. [name]  Wound Classification: Clean Contaminated  Indications: Patient is a 56 y.o. female developed right upper quadrant pain and on workup was found to have cholelithiasis with a normal common duct. Robotic Assisted Laparoscopic cholecystectomy was elected.  Findings: Distended gallbladder with wall thickening and non filling of ICG green Critical view of safety achieved Cystic duct and artery identified, ligated and divided Adequate hemostasis     Description of procedure: The patient was placed on the operating table in the supine position. General anesthesia was induced. A time-out was completed verifying correct patient, procedure, site, positioning, and implant(s) and/or special equipment prior to beginning this procedure. An orogastric tube was placed. The abdomen was prepped and draped in the usual sterile fashion.  An incision was made in a natural skin line below the umbilicus.  The fascia was elevated and the Veress needle inserted. Proper position was confirmed by aspiration and saline meniscus test.  The abdomen was insufflated with carbon dioxide to a pressure of 15 mmHg. The patient tolerated insufflation well. A 8-mm trocar was then inserted in optiview fashion.  The laparoscope was inserted and the abdomen inspected. No injuries from initial trocar placement were noted. Additional trocars were then inserted in the following locations: an 8-mm trocar in the left lateral abdomen, and another two 8-mm trocars to the right side of the abdomen 5 cm appart. The umbilical trocar was changed to a 12 mm trocar all under direct visualization. The abdomen was inspected and no abnormalities were found. The table was placed in the reverse Trendelenburg position with the right side up. The  robotic arms were docked and target anatomy identified. Instrument inserted under direct visualization.  Filmy adhesions between the gallbladder and omentum, duodenum and transverse colon were lysed with electrocautery. The dome of the gallbladder was grasped with a prograsp and retracted over the dome of the liver. The infundibulum was also grasped with an atraumatic grasper and retracted toward the right lower quadrant. This maneuver exposed Calot's triangle. The peritoneum overlying the gallbladder infundibulum was then incised and the cystic duct and cystic artery identified and circumferentially dissected. Critical view of safety reviewed before ligating any structure. Firefly images taken to visualize biliary ducts. The cystic duct and cystic artery were then doubly clipped and divided close to the gallbladder.  The gallbladder was then dissected from its peritoneal attachments by electrocautery. Hemostasis was checked and the gallbladder and contained stones were removed using an endoscopic retrieval bag. Gallbladder fossa irrigated with VistaSeal. The gallbladder was passed off the table as a specimen. There was no evidence of bleeding from the gallbladder fossa or cystic artery or leakage of the bile from the cystic duct stump. Secondary trocars were removed under direct vision. No bleeding was noted. The robotic arms were undoked. The scope was withdrawn and the umbilical trocar removed. The abdomen was allowed to collapse. The fascia of the 25mm trocar sites was closed with figure-of-eight 0 vicryl sutures. The skin was closed with subcuticular sutures of 4-0 monocryl and topical skin adhesive. The orogastric tube was removed.  The patient tolerated the procedure well and was taken to the postanesthesia care unit in stable condition.   Specimen: Gallbladder  Complications: None  EBL: 5 mL

## 2022-02-11 NOTE — Anesthesia Preprocedure Evaluation (Signed)
Anesthesia Evaluation  Patient identified by MRN, date of birth, ID band Patient awake    Reviewed: Allergy & Precautions, NPO status , Patient's Chart, lab work & pertinent test results  History of Anesthesia Complications Negative for: history of anesthetic complications  Airway Mallampati: I  TM Distance: >3 FB Neck ROM: Full    Dental no notable dental hx. (+) Teeth Intact   Pulmonary neg pulmonary ROS, neg sleep apnea, neg COPD, Patient abstained from smoking.Not current smoker,    Pulmonary exam normal breath sounds clear to auscultation       Cardiovascular Exercise Tolerance: Good METShypertension, Pt. on medications (-) CAD and (-) Past MI (-) dysrhythmias  Rhythm:Regular Rate:Normal - Systolic murmurs    Neuro/Psych PSYCHIATRIC DISORDERS Depression negative neurological ROS     GI/Hepatic neg GERD  ,(+)     (-) substance abuse  ,   Endo/Other  neg diabetesHypothyroidism   Renal/GU negative Renal ROS     Musculoskeletal   Abdominal   Peds  Hematology   Anesthesia Other Findings Past Medical History: No date: ADD (attention deficit disorder) No date: Depression No date: Dry mouth No date: Gallbladder disease No date: Hashimoto's disease No date: High blood pressure No date: PCOS (polycystic ovarian syndrome) No date: Swelling 01/2022: Symptomatic cholelithiasis No date: Thyroid disease No date: Varicose veins  Reproductive/Obstetrics                             Anesthesia Physical Anesthesia Plan  ASA: 2  Anesthesia Plan: General   Post-op Pain Management: Ofirmev IV (intra-op)* and Toradol IV (intra-op)*   Induction: Intravenous  PONV Risk Score and Plan: 4 or greater and Ondansetron, Dexamethasone and Midazolam  Airway Management Planned: Oral ETT  Additional Equipment: None  Intra-op Plan:   Post-operative Plan: Extubation in OR  Informed Consent: I  have reviewed the patients History and Physical, chart, labs and discussed the procedure including the risks, benefits and alternatives for the proposed anesthesia with the patient or authorized representative who has indicated his/her understanding and acceptance.     Dental advisory given  Plan Discussed with: CRNA and Surgeon  Anesthesia Plan Comments: (Discussed risks of anesthesia with patient, including PONV, sore throat, lip/dental/eye damage. Rare risks discussed as well, such as cardiorespiratory and neurological sequelae, and allergic reactions. Discussed the role of CRNA in patient's perioperative care. Patient understands.)        Anesthesia Quick Evaluation

## 2022-02-12 ENCOUNTER — Telehealth: Payer: Self-pay | Admitting: Registered Nurse

## 2022-02-12 ENCOUNTER — Encounter: Payer: Self-pay | Admitting: Registered Nurse

## 2022-02-12 DIAGNOSIS — T402X5A Adverse effect of other opioids, initial encounter: Secondary | ICD-10-CM

## 2022-02-12 LAB — SURGICAL PATHOLOGY

## 2022-02-12 MED ORDER — DOCUSATE SODIUM 100 MG PO CAPS: 100.0000 mg | ORAL_CAPSULE | Freq: Two times a day (BID) | ORAL | 0 refills | Status: DC

## 2022-02-12 NOTE — Telephone Encounter (Signed)
Spoke with patient via telephone today and having pain/constipation.  Was not Rx'd colace on discharge.  See tcon 02/12/22 new Rx sent to her pharmacy of choice and exitcare handout on constipation sent to her my chart.  Patient asking if can take sennekot encouraged her to follow up with her surgeon if any contraindications.  I recommended she start colace 100mg  po BID and titrate to effect to have one soft stool daily and avoid diarrhea.  Patient reported has not had much solid food in the past week and that is contributing to constipation also.  Discussed drink water to avoid dehydration and if urge to poop to not hold it for long periods but go to bathroom when she has urge as holding it can worsen constipation.  Patient also having some shoulder pain since waking up from surgery.  Discussed sometimes gas bubble from laparoscopic surgery can irritate phrenic nerve but gas bubble should be absorbed over the next couple days by the body.  If from positioning on OR table gentle arom/heat/ice and pain medication per her surgeon.  Patient stated she received rx for methocarbamol this am and going to try heat/ice and methocarbamol.  Discussed trying position changes side to side and head flat or lower may help also.  Patient reported head flat position puts more strain on her abdominal incisions/worsens GERD symptoms and she feels better with head of bed raised.  If any new or worsening symptoms to follow up with her surgeon.  Patient verbalized understanding information/instructions, agreed with plan of care and had no further questions at this time.

## 2022-02-12 NOTE — Telephone Encounter (Signed)
Spoke with patient via telephone today and having pain/constipation.  Was not Rx'd colace on discharge.  See tcon 02/12/22 new Rx sent to her pharmacy of choice and exitcare handout on constipation sent to her my chart.  Patient asking if can take sennekot encouraged her to follow up with her surgeon if any contraindications.  I recommended she start colace 100mg  po BID and titrate to effect to have one soft stool daily and avoid diarrhea.  Patient reported has not had much solid food in the past week and that is contributing to constipation also.  Discussed drink water to avoid dehydration and if urge to poop to not hold it for long periods but go to bathroom when she has urge as holding it can worsen constipation.  Patient also having some shoulder pain since waking up from surgery.  Discussed sometimes gas bubble from laparoscopic surgery can irritate phrenic nerve but gas bubble should be absorbed over the next couple days by the body.  If from positioning on OR table gentle arom/heat/ice and pain medication per her surgeon.  Patient stated she received rx for methocarbamol this am and going to try heat/ice and methocarbamol.  Discussed trying position changes side to side and head flat or lower may help also.  Patient reported head flat position puts more strain on her abdominal incisions/worsens GERD symptoms and she feels better with head of bed raised.  If any new or worsening symptoms to follow up with her surgeon. Patient has requested new work excuse note from surgeon's office.  Discussed with patient I could not see in [facility] during time of call.  Discussed with patient [place] had Epic upgrade this am and it is delaying some information transfers from [facility].  Patient stated she could not see work note in her my chart account either.  She will send note to HR once she receives it.  Patient reported she is out of work until follow up appt  02/24/22.   She was discharged yesterday  to home.  The stronger pain medication in the hospital was working better for her than the ones she was discharged to home on.  Her sister went to pharmacy to pick up her Rx for her.   Patient reported laparoscopic removal of her gallbladder was more difficult than expected as tissue was hard and she had golf ball sized stone inside per surgeon  Patient verbalized understanding information/instructions, agreed with plan of care and had no further questions at this time.

## 2022-02-24 NOTE — Telephone Encounter (Signed)
Notified by supervisor patient not returning to work until next week.  Epic reviewed  had surgery follow up today Olivia Masson, MD - 02/24/2022 10:45 AM EDT   Formatting of this note might be different from the original. 1. Increase activity as tolerated 2. We will need an extra week off resting and healing before returning to work. May return to work on 03/04/2022 without restrictions.  3. Continue local care to wounds with soap and water 4. Low fat diet is recommended 5. Contact the clinic if has any question or concern  Electronically signed by [name], [name], MD at 02/24/2022 11:08 AM EDT   Patient returned call 06/16 left message for NP stated still having sharp pain in right side.  Has follow up appt 6/20 with surgeon  Has only had one BM since surgery.  She will follow up with me on Monday and hopefully better by then as surgeon's office instructed her typically feeling better at that point post op.  I left message for patient asking how much solid food she has been able to tolerate.  If not much solid food/fiber in diet there will not be much stool production.  I am available via normal communication methods over the weekend if she has any questions or concerns.  No further contacts received from patient since 02/20/22.  Will attempt contact via telephone tomorrow follow up.  Work note sent to HR/patient supervisor regarding  May return to work on 03/04/2022 without restrictions per her Careers adviser.

## 2022-02-25 ENCOUNTER — Encounter: Payer: Self-pay | Admitting: Registered Nurse

## 2022-02-25 ENCOUNTER — Telehealth: Payer: Self-pay | Admitting: Registered Nurse

## 2022-02-25 DIAGNOSIS — K5909 Other constipation: Secondary | ICD-10-CM

## 2022-02-25 MED ORDER — POLYETHYLENE GLYCOL 3350 17 GM/SCOOP PO POWD: 17.0000 g | Freq: Every day | ORAL | 1 refills | Status: AC | PRN

## 2022-02-25 MED ORDER — DOCUSATE SODIUM 100 MG PO CAPS: 100.0000 mg | ORAL_CAPSULE | Freq: Two times a day (BID) | ORAL | 0 refills | Status: AC

## 2022-02-25 NOTE — Telephone Encounter (Signed)
Patient contacted via telephone stated she saw surgeon yesterday. He recommended not taking senekot but to take dolx but not every day.  She has stopped narcotic.  Only taking motrin now for pain.  Still having hard small ball formed stools.  Abdomen Pain 4-5/10 today.  Shoulder pain has improved/resolving.  Patient requested refill of colace 100mg po BID #30 RF0 and discussed miralax 1 capful po titrate to having 1 soft stool per day #1 RF1 electronically sent to her pharmacy of choice.  Hydrate with water to keep urine clear pale yellow and voiding every 4 hours when awake.  Patient reported he diet has only been about 500 calories per day the past 3 weeks.  Tried to eat some chicken and more real food yesterday.  Nausea still bothering her so not much appetite.  Discussed with patient I leave on vacation 6/23 -7/5 return 7/6  PA Ratcliffe and RN Haley in clinic next week if questions or concerns.  She can also email me and I would return message when I had cell service  Due to air flights no cell/wifi service 6/23 or 7/5 most of day.  Patient plans to RTW 03/04/22 at this time.  Patient doing some remove work/email from home this week.  A&Ox3 spoke full sentences without difficulty.  Patient denied diarrhea/vomiting/fever/chills.  Patient agreed with plan of care and had no further questions or concerns at this time. 

## 2022-02-25 NOTE — Telephone Encounter (Signed)
Patient contacted via telephone stated she saw surgeon yesterday. He recommended not taking senekot but to take dolx but not every day.  She has stopped narcotic.  Only taking motrin now for pain.  Still having hard small ball formed stools.  Abdomen Pain 4-5/10 today.  Shoulder pain has improved/resolving.  Patient requested refill of colace 100mg  po BID #30 RF0 and discussed miralax 1 capful po titrate to having 1 soft stool per day #1 RF1 electronically sent to her pharmacy of choice.  Hydrate with water to keep urine clear pale yellow and voiding every 4 hours when awake.  Patient reported he diet has only been about 500 calories per day the past 3 weeks.  Tried to eat some chicken and more real food yesterday.  Nausea still bothering her so not much appetite.  Discussed with patient I leave on vacation 6/23 -7/5 return 7/6  PA Ratcliffe and RN 9/6 in clinic next week if questions or concerns.  She can also email me and I would return message when I had cell service  Due to air flights no cell/wifi service 6/23 or 7/5 most of day.  Patient plans to RTW 03/04/22 at this time.  Patient doing some remove work/email from home this week.  A&Ox3 spoke full sentences without difficulty.  Patient denied diarrhea/vomiting/fever/chills.  Patient agreed with plan of care and had no further questions or concerns at this time.

## 2022-04-02 ENCOUNTER — Telehealth: Payer: Self-pay | Admitting: Registered Nurse

## 2022-04-02 ENCOUNTER — Encounter: Payer: Self-pay | Admitting: Registered Nurse

## 2022-04-02 DIAGNOSIS — T63481A Toxic effect of venom of other arthropod, accidental (unintentional), initial encounter: Secondary | ICD-10-CM

## 2022-04-02 MED ORDER — HYDROCORTISONE 1 % EX LOTN: 1.0000 | TOPICAL_LOTION | Freq: Two times a day (BID) | CUTANEOUS | 0 refills | Status: AC

## 2022-04-02 MED ORDER — DIPHENHYDRAMINE HCL 2 % EX GEL: 1.0000 | Freq: Two times a day (BID) | CUTANEOUS | Status: AC | PRN

## 2022-04-02 NOTE — Telephone Encounter (Signed)
Patient reported insect sting or bite unsure if bee or hornet yesterday to left leg.  Some swelling/redness today/pain.  Discussed may apply calagel topical BID prn itching.  Hydrocortisone 1% cream topical BID prn itching/swelling.  Patient has oral antihistamine at home and may take per manufacturer instructions prn.  Discussed ice 5 minutes QID prn may be helpful for swelling/pain also.  Tylenol 1000mg  po q6h prn pain.  Avoid scratching as can worsen swelling/pain with histamine release.  If purulent discharge, fever, chills, worsening/spreading rash  or ulcer notify clinic staff/PCM/get re-evaluated.  Patient verbalized understanding information/instructions, agreed with plan of care and had no further questions at this time.  Exitcare handouts on , Wasp, Hornet sting or insect bites sent to patient my chart.

## 2022-04-03 NOTE — Telephone Encounter (Signed)
Patient reported lower leg swelling worse below insect bite/sting today.  Wearing compression sock but ankle/foot swollen.  Has tried elevating some but has not applied ice.  Thought it was related to her varicose vein wondering if she can get Rx for lasix for a couple doses only left leg affected not right.  Discussed to apply ice to bug bite/sting as swelling upper leg most likely compressing vessels causing back pressure to lower leg.  Can consider taking extra 25mg  hydrochlorothiazide today if no improvement with elevate/ice but also to increase intake of potassium rich foods e.g. banana/potato/gatorade/powerade/soup broth.  Continue compression sock wear/topical application calagel/hydrocortisone upper leg affected area BID.  Notify NP if new or worsening symptoms this weekend.  Discussed heat index over 100 can also cause dilation of blood vessels/worsen swelling in dependent position extremities stay indoors/air conditioning as much as possible.  Patient verbalized understanding information/instructions, agreed with plan of care and had no further questions at this time.

## 2022-04-07 NOTE — Telephone Encounter (Signed)
Patient seen in workcenter today reported leg swelling almost completely resolved left upper thigh and lower leg.  Took a second hydrochlorothiazide pill last week and helped to decrease swelling lower leg.  Patient reported concerns resolved/symptoms almost completely resolved no further questions or concerns.  0-1+/4 scant nonpitting edema noted at bilateral ankles today.

## 2022-04-15 ENCOUNTER — Encounter (INDEPENDENT_AMBULATORY_CARE_PROVIDER_SITE_OTHER): Payer: Self-pay

## 2022-04-30 ENCOUNTER — Telehealth: Payer: Self-pay | Admitting: Registered Nurse

## 2022-04-30 DIAGNOSIS — I10 Essential (primary) hypertension: Secondary | ICD-10-CM

## 2022-04-30 DIAGNOSIS — R600 Localized edema: Secondary | ICD-10-CM

## 2022-04-30 MED ORDER — HYDROCHLOROTHIAZIDE 25 MG PO TABS: 25.0000 mg | ORAL_TABLET | Freq: Every day | ORAL | 0 refills | Status: DC | PRN

## 2022-04-30 MED ORDER — HYDROCHLOROTHIAZIDE 25 MG PO TABS: 25.0000 mg | ORAL_TABLET | Freq: Every day | ORAL | 0 refills | Status: DC

## 2022-04-30 NOTE — Telephone Encounter (Signed)
Patient reported has been taking 25mg  po daily every morning and prn afternoon if leg swelling worse than usual.  Will run out in 1-2 days depending on if needing 1 or 2 pills per day.    Patient dispensed #90 RF0 hydrocholorothiazide 25mg  po daily today and to notify me when running low/notify me if needing afternoon dose on regular basis for leg swelling as will dispense second bottle #90 depending on her needs.  Patient is attempting to eat healthier, exercise more and lose weight.  Patient verbalized understanding information/instructions and had no further questions at this time.  Last dispense May 2023 per [facility].  Last labs 01/23/22.  Patient to have blood pressure check with RN in the next 2 weeks.    Latest Reference Range & Units 01/23/22 02:27  COMPREHENSIVE METABOLIC PANEL  Rpt !  Sodium 135 - 145 mmol/L 138  Potassium 3.5 - 5.1 mmol/L 3.6  Chloride 98 - 111 mmol/L 100  CO2 22 - 32 mmol/L 29  Glucose 70 - 99 mg/dL 01/25/22 (H)  BUN 6 - 20 mg/dL 11  Creatinine 01/25/22 - 203 mg/dL 5.59  Calcium 8.9 - 7.41 mg/dL 9.3  Anion gap 5 - 15  9  Alkaline Phosphatase 38 - 126 U/L 92  Albumin 3.5 - 5.0 g/dL 4.3  Lipase 11 - 51 U/L 40  AST 15 - 41 U/L 23  ALT 0 - 44 U/L 20  Total Protein 6.5 - 8.1 g/dL 7.1  Total Bilirubin 0.3 - 1.2 mg/dL 0.8  GFR, Estimated 6.38 mL/min >60  !: Data is abnormal (H): Data is abnormally high Rpt: View report in Results Review for more information

## 2022-05-20 ENCOUNTER — Encounter: Payer: Self-pay | Admitting: Registered Nurse

## 2022-06-02 ENCOUNTER — Encounter: Payer: Self-pay | Admitting: Registered Nurse

## 2022-06-02 ENCOUNTER — Telehealth: Payer: Self-pay | Admitting: Registered Nurse

## 2022-06-02 DIAGNOSIS — R600 Localized edema: Secondary | ICD-10-CM

## 2022-06-02 MED ORDER — HYDROCHLOROTHIAZIDE 25 MG PO TABS: 25.0000 mg | ORAL_TABLET | Freq: Every day | ORAL | 0 refills | Status: DC

## 2022-06-02 NOTE — Telephone Encounter (Signed)
Using 25mg  in pm prn leg swelling.  Not taking every day.  Last filled hydrochlorothiazide 25mg  #90 04/30/22 patient concerned about running out prior to her 90 days.  Dispensed second bottle 25mg  hydrochlorothiazide from [facility] to patient today and she will notify me when running low for next refill. Patient working on [employer] loss/exercise/diet.  Seeing weight loss provider/PCM and changes in her hormones/adjustments being made by her care team.

## 2022-06-16 ENCOUNTER — Ambulatory Visit: Payer: No Typology Code available for payment source | Admitting: Registered Nurse

## 2022-06-16 DIAGNOSIS — M79672 Pain in left foot: Secondary | ICD-10-CM

## 2022-06-17 ENCOUNTER — Encounter: Payer: Self-pay | Admitting: Registered Nurse

## 2022-06-17 NOTE — Patient Instructions (Addendum)
Foot Pain Many things can cause foot pain. Some common causes are: An injury. A sprain. Arthritis. Blisters. Bunions. Follow these instructions at home: Managing pain, stiffness, and swelling If directed, put ice on the painful area: Put ice in a plastic bag. Place a towel between your skin and the bag. Leave the ice on for 20 minutes, 2-3 times a day.  Activity Do not stand or walk for long periods. Return to your normal activities as told by your health care provider. Ask your health care provider what activities are safe for you. Do stretches to relieve foot pain and stiffness as told by your health care provider. Do not lift anything that is heavier than 10 lb (4.5 kg), or the limit that you are told, until your health care provider says that it is safe. Lifting a lot of weight can put added pressure on your feet. Lifestyle Wear comfortable, supportive shoes that fit you well. Do not wear high heels. Keep your feet clean and dry. General instructions Take over-the-counter and prescription medicines only as told by your health care provider. Rub your foot gently. Pay attention to any changes in your symptoms. Keep all follow-up visits as told by your health care provider. This is important. Contact a health care provider if: Your pain does not get better after a few days of self-care. Your pain gets worse. You cannot stand on your foot. Get help right away if: Your foot is numb or tingling. Your foot or toes are swollen. Your foot or toes turn white or blue. You have warmth and redness along your foot. Summary Common causes of foot pain are injury, sprain, arthritis, blisters, or bunions. Ice, medicines, and comfortable shoes may help foot pain. Contact your health care provider if your pain does not get better after a few days of self-care. This information is not intended to replace advice given to you by your health care provider. Make sure you discuss any questions you  have with your health care provider. Document Revised: 11/27/2020 Document Reviewed: 11/27/2020 Elsevier Patient Education  2023 [employer] High Arch Feet  A high arch foot is a condition in which the arch of the foot does not touch the ground when a person is standing or walking. This condition is called high arch feet when it happens in both feet. In high arch feet, more weight is put on the parts of the feet that touch the ground, including the fronts, outer sides, and heels of the feet. This condition is also known as cavus foot. Having high arches can be painful and can make a person physically unstable. What are the causes? This condition may be caused by: Being born with a high arch in one foot or both feet. Being born with a foot that turns inward (club foot). Having a broken foot that does not heal properly. Sometimes, the cause of high arch feet is not known. What increases the risk? The following factors may make you more likely to develop this condition: Being female. Being an adult younger than 58 years of age. Having a family history of high arch feet. Having Charcot-Marie-Tooth disease. Having a nervous system (neurological) condition such as stroke, polio, spina bifida, cerebral palsy, or muscular dystrophy. In these conditions, high arches tend to get worse over time. What are the signs or symptoms? This condition can range from mild to severe. People with mildly high arches may have few symptoms. More severely high arches may cause: An unstable ankle with frequent  ankle sprains or strains. A sharp or shooting pain in the ball of your foot. Calluses on the front, side, and heel of the foot. Toes that are unusual in shape (toe deformities). Dragging of the foot (foot drop). This can happen if the high arch is caused by a neurological condition, which can cause the muscles that lift the foot to become weak. How is this diagnosed? This condition is diagnosed with a  physical exam of your foot and ankle. Your health care provider may also: Ask about your medical and family history. Order imaging tests, such as X-rays to look at the bone structure. Refer you to a health care provider who specializes in feet (podiatrist). If your health care provider thinks that you have a neurological condition, you may need to see a health care provider who specializes in the nervous system (neurologist). How is this treated? This condition may be treated to prevent pain and stabilize the foot. First treatments may include: A shoe insert (orthotic insert) to support and cushion one foot or both feet. Supportive shoes with high ankle support and a wide base. A brace to support a weak or unstable ankle. Physical therapy exercises to improve movement and strength and to loosen tight muscles in your feet. If these treatments do not help, you may need surgery. This is more likely when high arches are caused by a neurological condition that is getting worse. Follow these instructions at home: If you have a brace: Wear the brace as told by your health care provider. Remove it only as told by your health care provider. Loosen the brace if your toes tingle, become numb, or turn cold and blue. Keep the brace clean. Do not let the brace get wet if it is not waterproof. Ask your health care provider when it is safe to drive if you have a brace on your foot. Activity Do exercises as told by your health care provider, if they were prescribed. Return to your normal activities as told by your health care provider. Ask your health care provider what activities are safe for you. General instructions Take over-the-counter and prescription medicines only as told by your health care provider. Use an orthotic insert or inserts or wear supportive shoes as told by your health care provider. Keep all follow-up visits. This is important. Contact a health care provider if: You continue to have  pain when walking or standing. Your foot or ankle feels weak and unstable. You sprain your ankle. Summary A high arch foot is a condition in which the arch of the foot does not touch the ground when you are standing or walking. This condition is called high arch feet when it happens in both feet. A high arch foot can be painful and make you physically unstable. This condition is diagnosed with a physical exam and other tests. Treatment may include shoe inserts, supportive shoes, bracing, physical therapy, or surgery. This information is not intended to replace advice given to you by your health care provider. Make sure you discuss any questions you have with your health care provider. Document Revised: 02/01/2020 Document Reviewed: 02/01/2020 Elsevier Patient Education  [place].

## 2022-06-17 NOTE — Progress Notes (Signed)
Subjective:    Patient ID: Olivia Hamilton, female    DOB: [birth year], 56 y.o.   MRN: [id number]  55y/o caucasian established female here for evaluation left midfoot pain started after new walking exercise program last month.  New shoes saucony.  Walking dog and for exercise.  Trying to lose weight.  Leg swelling daily wearing compression hose and taking hydrochlorothiazide.  Has had some changes in her hormone therapy/menopausal symptoms.  Denied trauma/bruising/rash  Pain worsens over the course of day.  Heel not sore and pain on top of midfoot with walking.  Typically wearing sandals or sneakers at work.  Not using any inserts in shoes.  Has been told she has high arch in the past.  Had reaction to vivelle dots hormone therapy ?allergic to adhesive and left leg swelling last month now resolved.  Typically left leg swelling end of day more than right.      Review of Systems  Constitutional:  Positive for activity change and fatigue. Negative for appetite change, chills, diaphoresis and fever.  HENT:  Negative for trouble swallowing and voice change.   Eyes:  Negative for photophobia and visual disturbance.  Respiratory:  Negative for shortness of breath, wheezing and stridor.   Cardiovascular:  Positive for leg swelling. Negative for chest pain.  Gastrointestinal:  Negative for diarrhea and vomiting.  Genitourinary:  Negative for difficulty urinating.  Musculoskeletal:  Positive for arthralgias and myalgias. Negative for gait problem.  Skin:  Negative for color change, pallor, rash and wound.  Allergic/Immunologic: Positive for environmental allergies and food allergies.  Neurological:  Negative for tremors, syncope and weakness.  Hematological:  Negative for adenopathy. Does not bruise/bleed easily.  Psychiatric/Behavioral:  Negative for agitation, confusion and sleep disturbance.        Objective:   Physical Exam Constitutional:      General: She is awake. She is not in acute  distress.    Appearance: Normal appearance. She is well-developed and well-groomed. She is obese. She is not ill-appearing, toxic-appearing or diaphoretic.  HENT:     Head: Normocephalic and atraumatic.     Jaw: There is normal jaw occlusion.     Salivary Glands: Right salivary gland is not diffusely enlarged. Left salivary gland is not diffusely enlarged.     Right Ear: Hearing and external ear normal.     Left Ear: Hearing and external ear normal.     Nose: Nose normal. No congestion or rhinorrhea.     Mouth/Throat:     Lips: Pink. No lesions.     Mouth: Mucous membranes are moist.     Pharynx: Oropharynx is clear.  Eyes:     General: Lids are normal. Vision grossly intact. Gaze aligned appropriately. No scleral icterus.       Right eye: No discharge.        Left eye: No discharge.     Extraocular Movements: Extraocular movements intact.     Conjunctiva/sclera: Conjunctivae normal.     Pupils: Pupils are equal, round, and reactive to light.  Neck:     Trachea: Trachea normal.  Cardiovascular:     Rate and Rhythm: Normal rate and regular rhythm.     Pulses: Normal pulses.          Dorsalis pedis pulses are 2+ on the left side.       Posterior tibial pulses are 2+ on the left side.  Pulmonary:     Effort: Pulmonary effort is normal. No respiratory distress.  Breath sounds: Normal breath sounds and air entry. No stridor or transmitted upper airway sounds. No wheezing, rhonchi or rales.     Comments: Spoke full sentences without difficulty; no cough observed Abdominal:     Palpations: Abdomen is soft.  Musculoskeletal:        General: Tenderness present. No swelling or deformity. Normal range of motion.     Cervical back: Normal range of motion and neck supple.     Right lower leg: No deformity, lacerations or tenderness. No edema.     Left lower leg: No deformity, lacerations or tenderness. No edema.     Right ankle: No swelling, deformity, ecchymosis or lacerations. No  tenderness. Normal range of motion.     Left ankle: No swelling, deformity, ecchymosis or lacerations. No tenderness. Normal range of motion.     Left foot: Normal range of motion. No deformity or bunion.       Feet:  Feet:     Left foot:     Skin integrity: Callus present. No ulcer, blister, skin breakdown, erythema or fissure.     Comments: Patient wearing socks over compression knee high socks in tennis shoes today.  Compression hose not removed for exam today Lymphadenopathy:     Head:     Right side of head: No submandibular or preauricular adenopathy.     Left side of head: No submandibular or preauricular adenopathy.     Cervical:     Right cervical: No superficial cervical adenopathy.    Left cervical: No superficial cervical adenopathy.  Skin:    General: Skin is warm and dry.     Capillary Refill: Capillary refill takes less than 2 seconds.     Coloration: Skin is not ashen, cyanotic, jaundiced, mottled, pale or sallow.     Findings: No abrasion, bruising, burn, erythema, signs of injury, laceration, lesion, petechiae, rash or wound.  Neurological:     General: No focal deficit present.     Mental Status: She is alert and oriented to person, place, and time. Mental status is at baseline.     GCS: GCS eye subscore is 4. GCS verbal subscore is 5. GCS motor subscore is 6.     Cranial Nerves: Cranial nerves 2-12 are intact. No cranial nerve deficit, dysarthria or facial asymmetry.     Sensory: Sensation is intact.     Motor: Motor function is intact. No weakness, tremor, atrophy, abnormal muscle tone or seizure activity.     Coordination: Coordination is intact. Coordination normal.     Gait: Gait is intact. Gait normal.     Comments: In/out of chair without difficulty; gait sure and steady in workcenter; bilateral hand grasp equal 5/5  Psychiatric:        Attention and Perception: Attention and perception normal.        Mood and Affect: Mood and affect normal.        Speech:  Speech normal.        Behavior: Behavior normal. Behavior is cooperative.        Thought Content: Thought content normal.        Cognition and Memory: Cognition and memory normal.        Judgment: Judgment normal.           Assessment & Plan:   A-left midfoot pain  P-Discussed neutral footbed tennis shoes and she has high arch on exam.  Shoe inserts from [employer] or [employer] recommended.  Consider foot analysis free at [facility]  [employer] on [place].  Consider formal podiatry evaluation but may try OTC first.  Discussed avoid long walks until arch support obtained.  Discussed walking 5-15 minutes multiple times per day versus long extended hourly walk.  Continue compression socks due to leg/feet swelling by end of day and taking hydrochlorothiazide.  Elevate feet when sitting.  Continue weight loss efforts.  If no improvement with plan of care consider xray as obese, hypothyroid, hormonal therapy rule out fracture stress in left foot.  May be mid foot arthritis as cooler temperatures 40s at night in the past week could be triggering arthritis pain.  Consider trial heat/epsom salt soak evenings. Tylenol 1000mg  po QID prn pain preferred since on blood pressure medication.  Exitcare handout high arch and foot pain.  Follow up 2 weeks for re-evaluation sooner if new or worsening symptoms with plan of care.  Patient verbalized understanding information/instructions, agreed with plan of care and had no further questions at this time.

## 2022-06-29 DIAGNOSIS — S96919A Strain of unspecified muscle and tendon at ankle and foot level, unspecified foot, initial encounter: Secondary | ICD-10-CM | POA: Insufficient documentation

## 2022-07-07 ENCOUNTER — Ambulatory Visit (INDEPENDENT_AMBULATORY_CARE_PROVIDER_SITE_OTHER): Payer: No Typology Code available for payment source | Admitting: Internal Medicine

## 2022-07-07 ENCOUNTER — Encounter: Payer: Self-pay | Admitting: Internal Medicine

## 2022-07-07 VITALS — BP 136/88 | HR 65 | Temp 96.9°F | Ht 65.5 in | Wt 222.0 lb

## 2022-07-07 DIAGNOSIS — R7309 Other abnormal glucose: Secondary | ICD-10-CM

## 2022-07-07 DIAGNOSIS — Z114 Encounter for screening for human immunodeficiency virus [HIV]: Secondary | ICD-10-CM

## 2022-07-07 DIAGNOSIS — Z6836 Body mass index (BMI) 36.0-36.9, adult: Secondary | ICD-10-CM

## 2022-07-07 DIAGNOSIS — E038 Other specified hypothyroidism: Secondary | ICD-10-CM

## 2022-07-07 DIAGNOSIS — Z1211 Encounter for screening for malignant neoplasm of colon: Secondary | ICD-10-CM

## 2022-07-07 DIAGNOSIS — Z0001 Encounter for general adult medical examination with abnormal findings: Secondary | ICD-10-CM

## 2022-07-07 DIAGNOSIS — Z1159 Encounter for screening for other viral diseases: Secondary | ICD-10-CM

## 2022-07-07 DIAGNOSIS — E063 Autoimmune thyroiditis: Secondary | ICD-10-CM

## 2022-07-07 NOTE — Assessment & Plan Note (Signed)
Encourage diet and exercise for weight loss 

## 2022-07-07 NOTE — Progress Notes (Signed)
Subjective:    Patient ID: Olivia Hamilton, female    DOB: 11/24/1965, 56 y.o.   MRN: [id number]  HPI  Patient presents to clinic today for her annual exam.  Flu: Never Tetanus: >10 years ago COVID: Never Shingrix: Never Pap smear: 2020, [facility] Mammogram: 09/2021 Bone density:  2020, [facility] Colon screening: never Vision screening: every 2 years Dentist: biannually  Diet: She does eat meat. She consumes fruits and veggies. She does eat some fried foods. She drinks mostly water. Exercise: Walking  Review of Systems     Past Medical History:  Diagnosis Date   ADD (attention deficit disorder)    Depression    Dry mouth    Gallbladder disease    Hashimoto's disease    High blood pressure    PCOS (polycystic ovarian syndrome)    Swelling    Symptomatic cholelithiasis 01/2022   Thyroid disease    Varicose veins     Current Outpatient Medications  Medication Sig Dispense Refill   albuterol (VENTOLIN HFA) 108 (90 Base) MCG/ACT inhaler Inhale 2 puffs into the lungs every 6 (six) hours as needed for wheezing or shortness of breath. (Patient taking differently: Inhale 2 puffs into the lungs every 6 (six) hours as needed for wheezing or shortness of breath. Takes only for upper respiratory infections usually annually) 8 g 0   Ascorbic Acid (VITAMIN C) 1000 MG tablet Take 1,000 mg by mouth daily.     aspirin 81 MG tablet Take 81 mg by mouth daily.      Ca Phosphate-Cholecalciferol (CALCIUM/VITAMIN D3 GUMMIES) 250-350 MG-UNIT CHEW Chew 250 mg by mouth. Two daily     Fluocinolone Acetonide Body 0.01 % OIL Apply 1 application. topically as needed.     fluticasone (FLONASE) 50 MCG/ACT nasal spray Place 1 spray into both nostrils 2 (two) times daily as needed for allergies or rhinitis. 48 mL 3   hydrochlorothiazide (HYDRODIURIL) 25 MG tablet Take 1 tablet (25 mg total) by mouth daily as needed. 30 tablet 0   hydrochlorothiazide (HYDRODIURIL) 25 MG tablet  Take 1 tablet (25 mg total) by mouth daily. 90 tablet 0   hydrochlorothiazide (HYDRODIURIL) 25 MG tablet Take 1 tablet (25 mg total) by mouth daily. 90 tablet 0   HYDROcodone-acetaminophen (NORCO/VICODIN) 5-325 MG tablet Take 1 tablet by mouth every 4 (four) hours as needed for moderate pain. 20 tablet 0   levothyroxine (SYNTHROID, LEVOTHROID) 88 MCG tablet Take 88 mcg by mouth daily before breakfast.     liothyronine (CYTOMEL) 25 MCG tablet PLEASE SEE ATTACHED FOR DETAILED DIRECTIONS     loratadine (CLARITIN) 10 MG tablet Take 10 mg by mouth daily.     methocarbamol (ROBAXIN) 500 MG tablet Take 500 mg by mouth 3 (three) times daily.     NON FORMULARY Apply 1 application. topically daily. estridol     ondansetron (ZOFRAN-ODT) 4 MG disintegrating tablet Take 1 tablet (4 mg total) by mouth every 8 (eight) hours as needed for nausea or vomiting. 20 tablet 0   progesterone (PROMETRIUM) 100 MG capsule Take 1 capsule by mouth at bedtime.     sodium chloride (OCEAN) 0.65 % SOLN nasal spray Place 2 sprays into both nostrils every 2 (two) hours while awake.  0   traMADol (ULTRAM) 50 MG tablet Take 1 tablet (50 mg total) by mouth every 6 (six) hours as needed. 20 tablet 0   Vitamin D, Ergocalciferol, (DRISDOL) 1.25 MG (50000 UNIT) CAPS capsule Take one cap  once weekly 4 capsule 0   No current facility-administered medications for this visit.    Allergies  Allergen Reactions   Sulfur Dioxide Swelling and Other (See Comments)    Found in eye drops - made eyes swell   Casein Diarrhea and Other (See Comments)   Gluten Meal Swelling   Sulfonamide Derivatives     REACTION: eye swelling   Whey Protein [Protein] Diarrhea    Family History  Problem Relation Age of Onset   Obesity Mother    Thyroid disease Mother    Other Mother        VARICOSE VEINS   Arthritis Mother    Hypertension Mother    Obesity Father    Cancer Father    Hypertension Father    Lung cancer Father    Breast cancer Neg Hx      Social History   Socioeconomic History   Marital status: Legally Separated    Spouse name: Not on file   Number of children: 0   Years of education: Not on file   Highest education level: Not on file  Occupational History   Occupation: Investment banker, corporate  Tobacco Use   Smoking status: Never   Smokeless tobacco: Never  Vaping Use   Vaping Use: Never used  Substance and Sexual Activity   Alcohol use: Yes    Alcohol/week: 2.0 standard drinks of alcohol    Types: 1 Glasses of wine, 1 Cans of beer per week    Comment: occasssional   Drug use: Never   Sexual activity: Not Currently  Other Topics Concern   Not on file  Social History Narrative   Lives alone   Social Determinants of Health   Financial Resource Strain: Not on file  Food Insecurity: Not on file  Transportation Needs: Not on file  Physical Activity: Not on file  Stress: Not on file  Social Connections: Not on file  Intimate Partner Violence: Not on file     Constitutional: Denies fever, malaise, fatigue, headache or abrupt weight changes.  HEENT: Denies eye pain, eye redness, ear pain, ringing in the ears, wax buildup, runny nose, nasal congestion, bloody nose, or sore throat. Respiratory: Denies difficulty breathing, shortness of breath, cough or sputum production.   Cardiovascular: Pt reports swelling in legs. Denies chest pain, chest tightness, palpitations or swelling in the hands.  Gastrointestinal: Pt reports intermittent reflux. Denies abdominal pain, bloating, constipation, diarrhea or blood in the stool.  GU: Denies urgency, frequency, pain with urination, burning sensation, blood in urine, odor or discharge. Musculoskeletal: Denies decrease in range of motion, difficulty with gait, muscle pain or joint pain and swelling.  Skin: Denies redness, rashes, lesions or ulcercations.  Neurological: Patient reports difficulty focusing.  Denies dizziness, difficulty with memory, difficulty with  speech or problems with balance and coordination.  Psych: Denies anxiety, depression, SI/HI.  No other specific complaints in a complete review of systems (except as listed in HPI above).  Objective:   Physical Exam  BP 136/88 (BP Location: Right Arm, Patient Position: Sitting, Cuff Size: Normal)   Pulse 65   Temp (!) 96.9 F (36.1 C) (Temporal)   Ht 5' 5.5" (1.664 m)   Wt 222 lb (100.7 kg)   SpO2 100%   BMI 36.38 kg/m   Wt Readings from Last 3 Encounters:  07/07/22 222 lb (100.7 kg)  02/11/22 220 lb 0.3 oz (99.8 kg)  02/10/22 220 lb (99.8 kg)    General: Appears her stated age,  obese, in NAD. Skin: Warm, dry and intact. HEENT: Head: normal shape and size; Eyes: sclera white, no icterus, conjunctiva pink, PERRLA and EOMs intact;  Neck:  Neck supple, trachea midline. No masses, lumps or thyromegaly present.  Cardiovascular: Normal rate and rhythm. S1,S2 noted.  No murmur, rubs or gallops noted.  Trace BLE edema. No carotid bruits noted. Pulmonary/Chest: Normal effort and positive vesicular breath sounds. No respiratory distress. No wheezes, rales or ronchi noted.  Abdomen:  Normal bowel sounds.  Musculoskeletal: Strength 5/5 BUE/BLE.  No difficulty with gait.  Neurological: Alert and oriented. Cranial nerves II-XII grossly intact. Coordination normal.  Psychiatric: Mood and affect normal. Behavior is normal. Judgment and thought content normal.    BMET    Component Value Date/Time   NA 138 01/23/2022 0227   NA 142 02/18/2021 1025   K 3.6 01/23/2022 0227   CL 100 01/23/2022 0227   CO2 29 01/23/2022 0227   GLUCOSE 104 (H) 01/23/2022 0227   BUN 11 01/23/2022 0227   BUN 15 02/18/2021 1025   CREATININE 0.76 01/23/2022 0227   CREATININE 0.94 01/20/2022 1016   CALCIUM 9.3 01/23/2022 0227   GFRNONAA >60 01/23/2022 0227   GFRAA 88 07/05/2020 1005    Lipid Panel     Component Value Date/Time   CHOL 219 (H) 02/18/2021 1025   TRIG 106 02/18/2021 1025   HDL 75 02/18/2021  1025   CHOLHDL 3.0 01/28/2017 0917   LDLCALC 126 (H) 02/18/2021 1025    CBC    Component Value Date/Time   WBC 6.5 01/23/2022 0227   RBC 5.13 (H) 01/23/2022 0227   HGB 15.0 01/23/2022 0227   HGB 15.3 02/18/2021 1025   HCT 44.9 01/23/2022 0227   HCT 46.6 02/18/2021 1025   PLT 279 01/23/2022 0227   PLT 234 02/18/2021 1025   MCV 87.5 01/23/2022 0227   MCV 90 02/18/2021 1025   MCH 29.2 01/23/2022 0227   MCHC 33.4 01/23/2022 0227   RDW 12.6 01/23/2022 0227   RDW 12.4 02/18/2021 1025   LYMPHSABS 1.8 02/18/2021 1025   EOSABS 0.3 02/18/2021 1025   BASOSABS 0.1 02/18/2021 1025    Hgb A1C Lab Results  Component Value Date   HGBA1C 5.2 02/18/2021           Assessment & Plan:   Preventative Health Maintenance:  She declines flu shot today She declines tetanus booster Encouraged her to get her COVID-vaccine Discussed Shingrix vaccine, she will check coverage with her insurance company and schedule a nurse visit if she would like to have this done Pap smear UTD, will request record Mammogram UTD Bone density ordered- UTD will request record Cologuard ordered Encouraged her to consume a balanced diet and exercise regimen Advised her to see an eye doctor and dentist annually We will check CBC, c-Met, TSH, free T4, lipid, A1c, HIV and hep C today  RTC in 6 months, follow-up chronic conditions Webb Silversmith, NP

## 2022-07-07 NOTE — Patient Instructions (Signed)
Health Maintenance for Postmenopausal Women Menopause is a normal process in which your ability to get pregnant comes to an end. This process happens slowly over many months or years, usually between the ages of 48 and 55. Menopause is complete when you have missed your menstrual period for 12 months. It is important to talk with your health care provider about some of the most common conditions that affect women after menopause (postmenopausal women). These include heart disease, cancer, and bone loss (osteoporosis). Adopting a healthy lifestyle and getting preventive care can help to promote your health and wellness. The actions you take can also lower your chances of developing some of these common conditions. What are the signs and symptoms of menopause? During menopause, you may have the following symptoms: Hot flashes. These can be moderate or severe. Night sweats. Decrease in sex drive. Mood swings. Headaches. Tiredness (fatigue). Irritability. Memory problems. Problems falling asleep or staying asleep. Talk with your health care provider about treatment options for your symptoms. Do I need hormone replacement therapy? Hormone replacement therapy is effective in treating symptoms that are caused by menopause, such as hot flashes and night sweats. Hormone replacement carries certain risks, especially as you become older. If you are thinking about using estrogen or estrogen with progestin, discuss the benefits and risks with your health care provider. How can I reduce my risk for heart disease and stroke? The risk of heart disease, heart attack, and stroke increases as you age. One of the causes may be a change in the body's hormones during menopause. This can affect how your body uses dietary fats, triglycerides, and cholesterol. Heart attack and stroke are medical emergencies. There are many things that you can do to help prevent heart disease and stroke. Watch your blood pressure High  blood pressure causes heart disease and increases the risk of stroke. This is more likely to develop in people who have high blood pressure readings or are overweight. Have your blood pressure checked: Every 3-5 years if you are 18-39 years of age. Every year if you are 40 years old or older. Eat a healthy diet  Eat a diet that includes plenty of vegetables, fruits, low-fat dairy products, and lean protein. Do not eat a lot of foods that are high in solid fats, added sugars, or sodium. Get regular exercise Get regular exercise. This is one of the most important things you can do for your health. Most adults should: Try to exercise for at least 150 minutes each week. The exercise should increase your heart rate and make you sweat (moderate-intensity exercise). Try to do strengthening exercises at least twice each week. Do these in addition to the moderate-intensity exercise. Spend less time sitting. Even light physical activity can be beneficial. Other tips Work with your health care provider to achieve or maintain a healthy weight. Do not use any products that contain nicotine or tobacco. These products include cigarettes, chewing tobacco, and vaping devices, such as e-cigarettes. If you need help quitting, ask your health care provider. Know your numbers. Ask your health care provider to check your cholesterol and your blood sugar (glucose). Continue to have your blood tested as directed by your health care provider. Do I need screening for cancer? Depending on your health history and family history, you may need to have cancer screenings at different stages of your life. This may include screening for: Breast cancer. Cervical cancer. Lung cancer. Colorectal cancer. What is my risk for osteoporosis? After menopause, you may be   at increased risk for osteoporosis. Osteoporosis is a condition in which bone destruction happens more quickly than new bone creation. To help prevent osteoporosis or  the bone fractures that can happen because of osteoporosis, you may take the following actions: If you are 19-50 years old, get at least 1,000 mg of calcium and at least 600 international units (IU) of vitamin D per day. If you are older than age 50 but younger than age 70, get at least 1,200 mg of calcium and at least 600 international units (IU) of vitamin D per day. If you are older than age 70, get at least 1,200 mg of calcium and at least 800 international units (IU) of vitamin D per day. Smoking and drinking excessive alcohol increase the risk of osteoporosis. Eat foods that are rich in calcium and vitamin D, and do weight-bearing exercises several times each week as directed by your health care provider. How does menopause affect my mental health? Depression may occur at any age, but it is more common as you become older. Common symptoms of depression include: Feeling depressed. Changes in sleep patterns. Changes in appetite or eating patterns. Feeling an overall lack of motivation or enjoyment of activities that you previously enjoyed. Frequent crying spells. Talk with your health care provider if you think that you are experiencing any of these symptoms. General instructions See your health care provider for regular wellness exams and vaccines. This may include: Scheduling regular health, dental, and eye exams. Getting and maintaining your vaccines. These include: Influenza vaccine. Get this vaccine each year before the flu season begins. Pneumonia vaccine. Shingles vaccine. Tetanus, diphtheria, and pertussis (Tdap) booster vaccine. Your health care provider may also recommend other immunizations. Tell your health care provider if you have ever been abused or do not feel safe at home. Summary Menopause is a normal process in which your ability to get pregnant comes to an end. This condition causes hot flashes, night sweats, decreased interest in sex, mood swings, headaches, or lack  of sleep. Treatment for this condition may include hormone replacement therapy. Take actions to keep yourself healthy, including exercising regularly, eating a healthy diet, watching your weight, and checking your blood pressure and blood sugar levels. Get screened for cancer and depression. Make sure that you are up to date with all your vaccines. This information is not intended to replace advice given to you by your health care provider. Make sure you discuss any questions you have with your health care provider. Document Revised: 01/13/2021 Document Reviewed: 01/13/2021 Elsevier Patient Education  2023 [employer]  

## 2022-07-08 LAB — COMPLETE METABOLIC PANEL WITH GFR
AG Ratio: 1.8 (calc) (ref 1.0–2.5)
ALT: 12 U/L (ref 6–29)
AST: 13 U/L (ref 10–35)
Albumin: 4 g/dL (ref 3.6–5.1)
Alkaline phosphatase (APISO): 88 U/L (ref 37–153)
BUN: 16 mg/dL (ref 7–25)
CO2: 27 mmol/L (ref 20–32)
Calcium: 9.4 mg/dL (ref 8.6–10.4)
Chloride: 103 mmol/L (ref 98–110)
Creat: 0.92 mg/dL (ref 0.50–1.03)
Globulin: 2.2 g/dL (calc) (ref 1.9–3.7)
Glucose, Bld: 84 mg/dL (ref 65–99)
Potassium: 4 mmol/L (ref 3.5–5.3)
Sodium: 140 mmol/L (ref 135–146)
Total Bilirubin: 0.5 mg/dL (ref 0.2–1.2)
Total Protein: 6.2 g/dL (ref 6.1–8.1)
eGFR: 73 mL/min/{1.73_m2} (ref >=60)

## 2022-07-08 LAB — HEPATITIS C ANTIBODY: Hepatitis C Ab: NONREACTIVE

## 2022-07-08 LAB — LIPID PANEL
Cholesterol: 205 mg/dL — ABNORMAL HIGH (ref <200)
HDL: 64 mg/dL (ref >=50)
LDL Cholesterol (Calc): 116 mg/dL (calc) — ABNORMAL HIGH
Non-HDL Cholesterol (Calc): 141 mg/dL (calc) — ABNORMAL HIGH (ref <130)
Total CHOL/HDL Ratio: 3.2 (calc) (ref <5.0)
Triglycerides: 133 mg/dL (ref <150)

## 2022-07-08 LAB — CBC
HCT: 43.9 % (ref 35.0–45.0)
Hemoglobin: 14.6 g/dL (ref 11.7–15.5)
MCH: 29.9 pg (ref 27.0–33.0)
MCHC: 33.3 g/dL (ref 32.0–36.0)
MCV: 89.8 fL (ref 80.0–100.0)
MPV: 9.9 fL (ref 7.5–12.5)
Platelets: 255 10*3/uL (ref 140–400)
RBC: 4.89 10*6/uL (ref 3.80–5.10)
RDW: 11.9 % (ref 11.0–15.0)
WBC: 5.3 10*3/uL (ref 3.8–10.8)

## 2022-07-08 LAB — HEMOGLOBIN A1C
Hgb A1c MFr Bld: 5.5 % of total Hgb (ref <5.7)
Mean Plasma Glucose: 111 mg/dL
eAG (mmol/L): 6.2 mmol/L

## 2022-07-08 LAB — HIV ANTIBODY (ROUTINE TESTING W REFLEX): HIV 1&2 Ab, 4th Generation: NONREACTIVE

## 2022-07-08 LAB — T4, FREE: Free T4: 1.1 ng/dL (ref 0.8–1.8)

## 2022-07-08 LAB — TSH: TSH: 0.01 mIU/L — ABNORMAL LOW (ref 0.40–4.50)

## 2022-07-09 ENCOUNTER — Encounter: Payer: Self-pay | Admitting: Internal Medicine

## 2022-07-11 ENCOUNTER — Telehealth: Payer: Self-pay | Admitting: Registered Nurse

## 2022-07-11 ENCOUNTER — Encounter: Payer: Self-pay | Admitting: Registered Nurse

## 2022-07-11 DIAGNOSIS — R7989 Other specified abnormal findings of blood chemistry: Secondary | ICD-10-CM

## 2022-07-11 NOTE — Telephone Encounter (Signed)
Patient stated had labs drawn for PCM this week.  Her wellness provider also wants labs done and she was given [facility] slip with requested orders.  Patient asking how long she has to wait to have blood drawn again.  Discussed with patient if worried that she has one site on arm they typically use if not bruised/swollen or tender could have labs done today.  Reviewed epic to see if [facility] labs processed at [facility] and they were not performed at [facility] but [facility] in [place] therefore unable for me to add onto samples already drawn.    Results reviewed with patient in detail.  Hormones had been adjusted by wellness provider and she is going to follow up with that provider to adjust thyroid medication since TSH low.  Discussed CBC, kidney/liver function, electrolytes, blood sugar normal.  HIV and hepatitis C tests negative.    Total and LDL cholesterol elevated but overall ratio good due to HDL 64.  No anemia or infection on labs.  Discussed with patient low TSH acts like hyperthyroidism and can affect cholesterol, heart rate, sleep, and concentration.  Exitcare handout on hyperthyroidism sent to patient.  Patient is trying to lose weight at this time.  Patient verbalized understanding information/instructions, agreed with plan of care and had no further questions at this time.  Latest Reference Range & Units 07/07/22 10:01  Sodium 135 - 146 mmol/L 140  Potassium 3.5 - 5.3 mmol/L 4.0  Chloride 98 - 110 mmol/L 103  CO2 20 - 32 mmol/L 27  Glucose 65 - 99 mg/dL 84  Mean Plasma Glucose mg/dL 111  BUN 7 - 25 mg/dL 16  Creatinine 0.50 - 1.03 mg/dL 0.92  Calcium 8.6 - 10.4 mg/dL 9.4  BUN/Creatinine Ratio 6 - 22 (calc) SEE NOTE:  eGFR > OR = 60 mL/min/1.83m 73  AG Ratio 1.0 - 2.5 (calc) 1.8  AST 10 - 35 U/L 13  ALT 6 - 29 U/L 12  Total Protein 6.1 - 8.1 g/dL 6.2  Total Bilirubin 0.2 - 1.2 mg/dL 0.5  Total CHOL/HDL Ratio <5.0 (calc) 3.2  Cholesterol <200 mg/dL 205 (H)  HDL Cholesterol > OR = 50 mg/dL 64   LDL Cholesterol (Calc) mg/dL (calc) 116 (H)  Non-HDL Cholesterol (Calc) <130 mg/dL (calc) 141 (H)  Triglycerides <150 mg/dL 133  Alkaline phosphatase (APISO) 37 - 153 U/L 88  Globulin 1.9 - 3.7 g/dL (calc) 2.2  WBC 3.8 - 10.8 Thousand/uL 5.3  RBC 3.80 - 5.10 Million/uL 4.89  Hemoglobin 11.7 - 15.5 g/dL 14.6  HCT 35.0 - 45.0 % 43.9  MCV 80.0 - 100.0 fL 89.8  MCH 27.0 - 33.0 pg 29.9  MCHC 32.0 - 36.0 g/dL 33.3  RDW 11.0 - 15.0 % 11.9  Platelets 140 - 400 Thousand/uL 255  MPV 7.5 - 12.5 fL 9.9  eAG (mmol/L) mmol/L 6.2  Hemoglobin A1C <5.7 % of total Hgb 5.5  TSH 0.40 - 4.50 mIU/L 0.01 (L)  T4,Free(Direct) 0.8 - 1.8 ng/dL 1.1  Albumin MSPROF 3.6 - 5.1 g/dL 4.0  Hepatitis C Ab NON-REACTIVE  NON-REACTIVE  HIV NON-REACTIVE  NON-REACTIVE  (H): Data is abnormally high (L): Data is abnormally low

## 2022-07-19 IMAGING — MG MM DIGITAL SCREENING BILAT W/ TOMO AND CAD
6 of 10 series · 6 of 30 positions shown · non-contrast
Comparison: Previous exam(s).

CLINICAL DATA: Screening.

EXAM:
DIGITAL SCREENING BILATERAL MAMMOGRAM WITH TOMOSYNTHESIS AND CAD
TECHNIQUE: Bilateral screening digital craniocaudal and mediolateral oblique
mammograms were obtained. Bilateral screening digital breast
tomosynthesis was performed. The images were evaluated with
computer-aided detection.

[L CC synth-2D (1 of 2)]
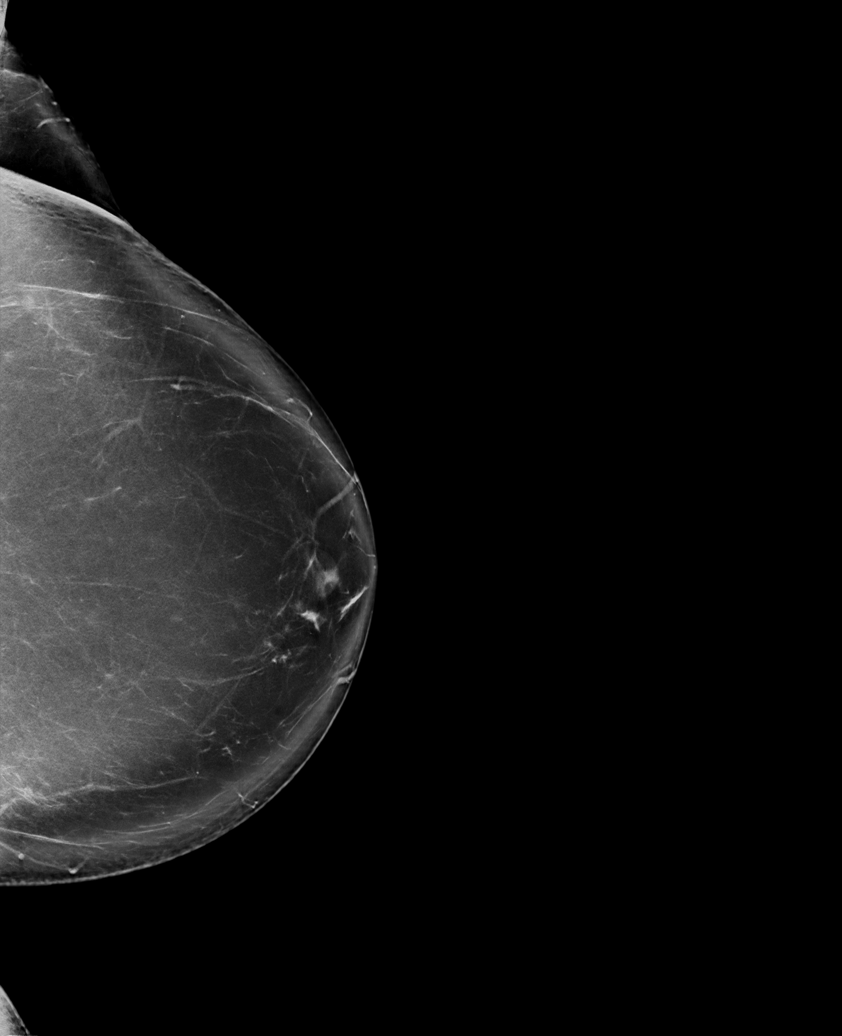

[L MLO synth-2D]
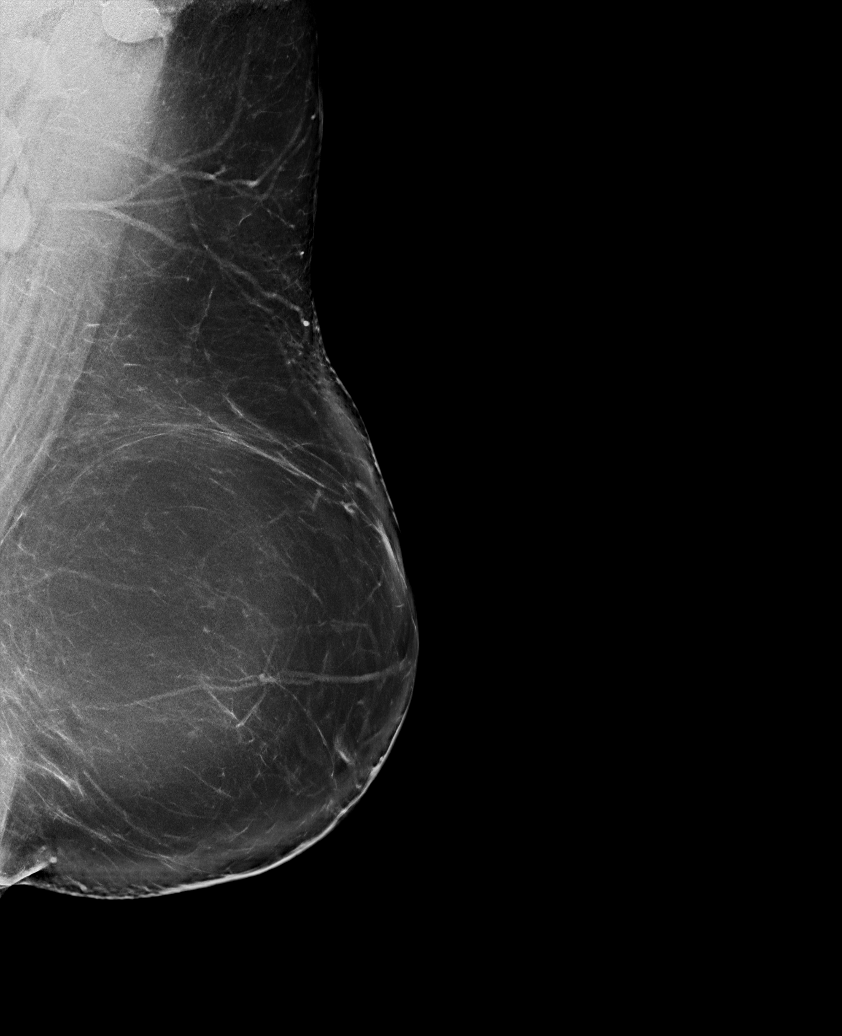

[L CC synth-2D (2 of 2)]
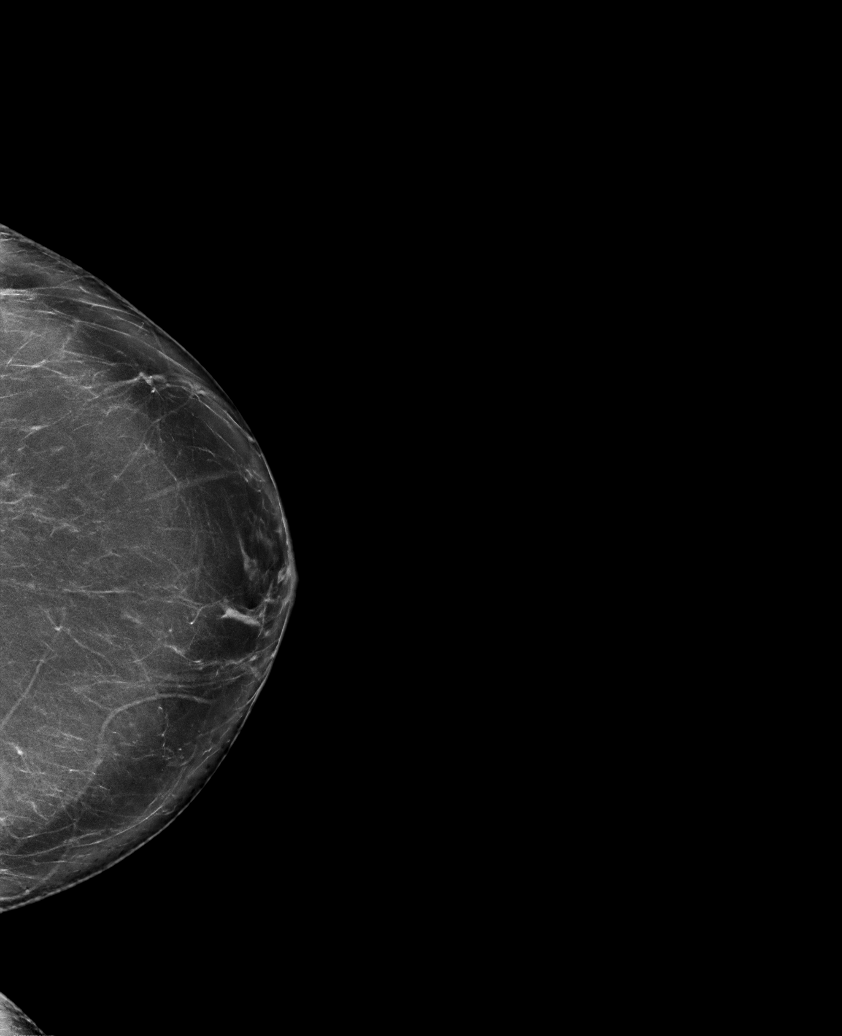

[R CC synth-2D]
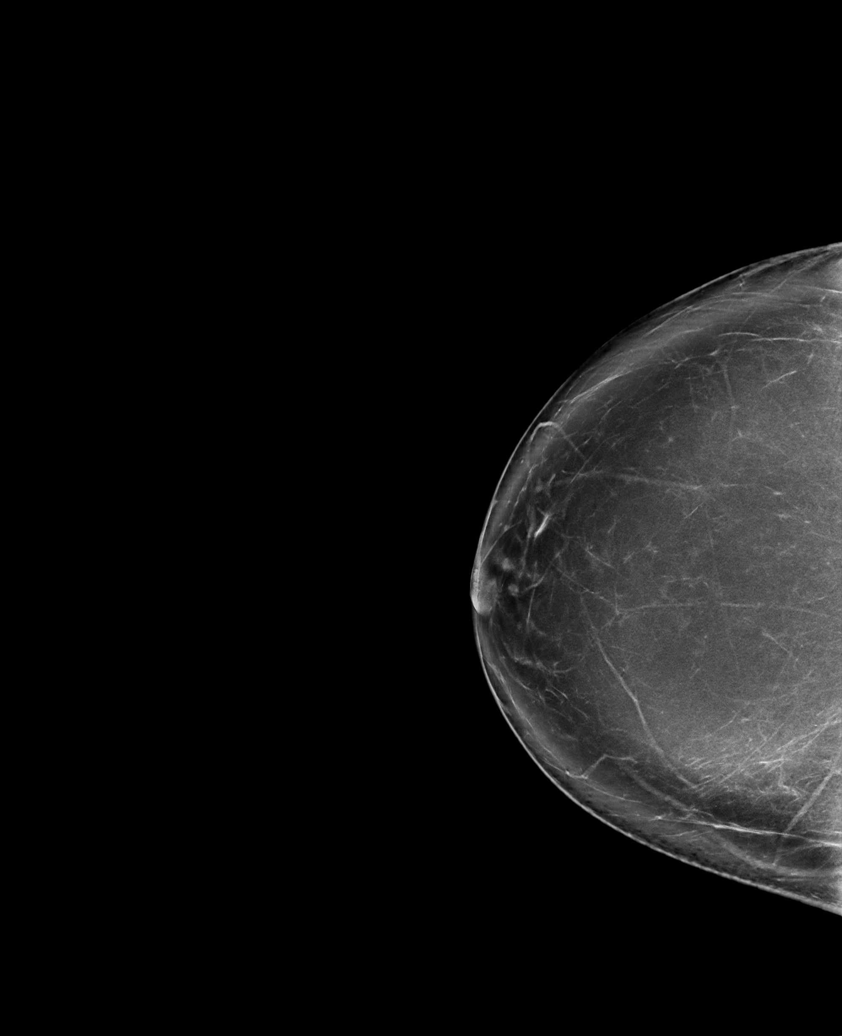

[R MLO synth-2D]
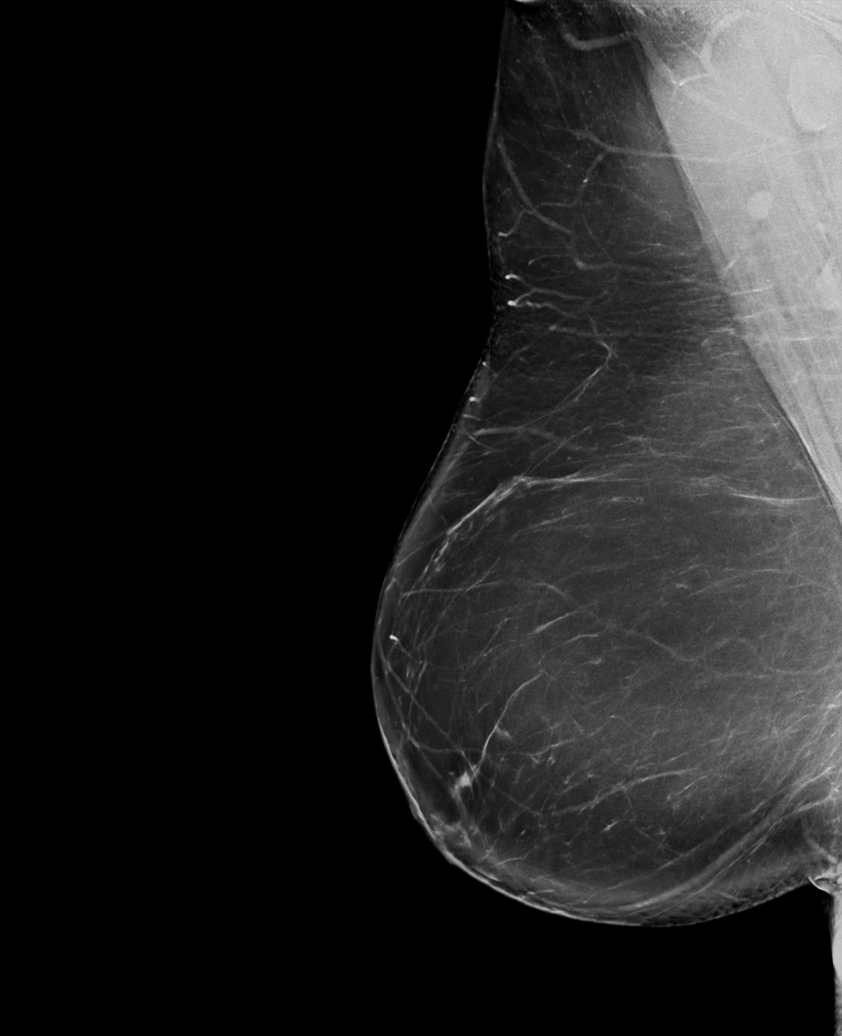

[L CC tomo · tomo slice 44/87.0]
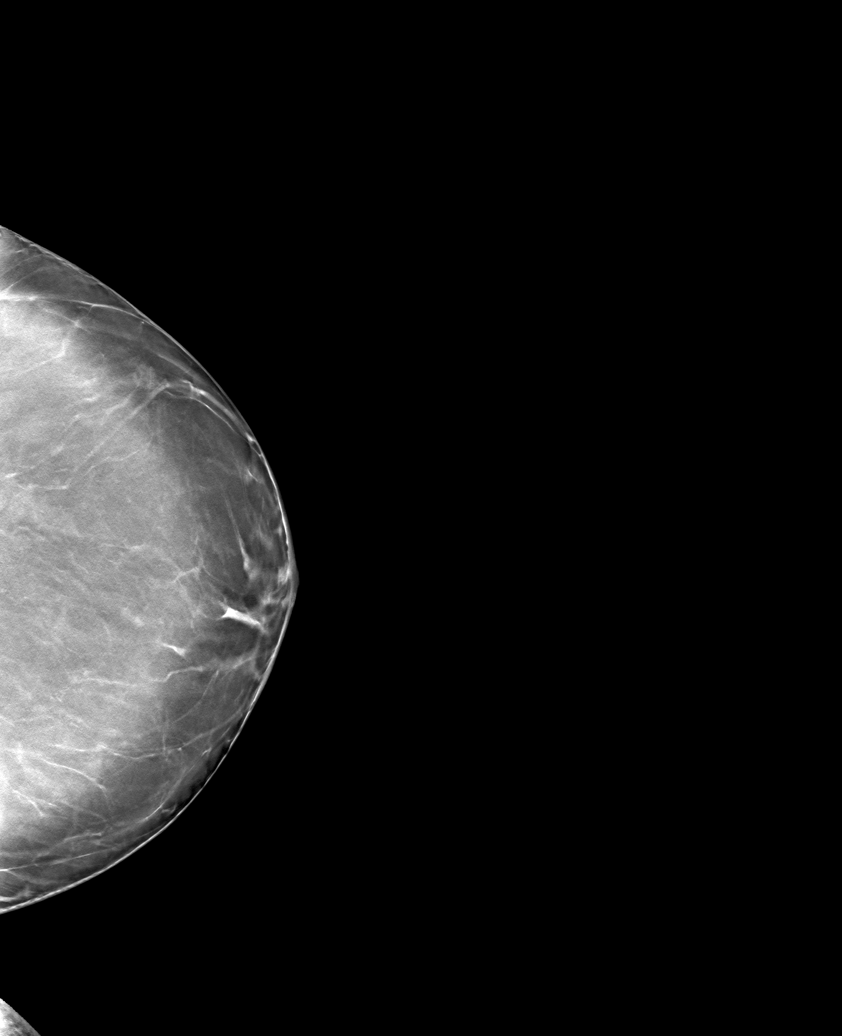

[6 of 30 positions shown; findings below may reference images not displayed]

ACR Breast Density Category b: There are scattered areas of
fibroglandular density.
FINDINGS: There are no findings suspicious for malignancy.
IMPRESSION: No mammographic evidence of malignancy. A result letter of this
screening mammogram will be mailed directly to the patient.

RECOMMENDATION:
Screening mammogram in one year. (Code:51-O-LD2)

BI-RADS CATEGORY  1: Negative.

## 2022-08-05 ENCOUNTER — Telehealth: Payer: Self-pay | Admitting: Registered Nurse

## 2022-08-05 DIAGNOSIS — H109 Unspecified conjunctivitis: Secondary | ICD-10-CM

## 2022-08-07 MED ORDER — ERYTHROMYCIN 5 MG/GM OP OINT: 1.0000 | TOPICAL_OINTMENT | Freq: Four times a day (QID) | OPHTHALMIC | 0 refills | Status: AC

## 2022-08-07 NOTE — Telephone Encounter (Signed)
Late entry 08/06/22 @1530  Patient seen in workcenter.  Injection left eye 2+/4 right eye 1+/4 without matting/debris in eyelashes/eyebrows.  Denied visual changes.  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  Patient reported sick family visiting last week URI/GI upset for holiday  Some nasal congestion audible.  Spoke full sentences without difficulty A&Ox3 no audible cough/throat clearing/wheezing/shortness of breath.  Respirations even and unlabored skin warm dry and pink gait sure and steady.  Patient reported stayed home yesterday to ensure GI upset resolved 24 hours per employer policy.Discussed to restart ketotifen eye drops 1 each eye twice a day at home and continue refresh 2 gtts ou TID OTC.  Follow up for re-evaluation or notify NP if worsening symptoms especially redness/discharge/fever/pain.  Patient verbalized understanding information/instructions, agreed with plan of care and had no further questions at this time.

## 2022-08-07 NOTE — Telephone Encounter (Signed)
Patient contacted NP ketotifen causing burning sensation in eye and had discharge this am but not matted completely shut.  Spoke with patient via telephone 08/07/22.  Denied visual changes.  Eye is irritated using refresh drops and eye compresses given minimal relief.  Electronic Rx erythromycin ophthalmic ointment instill 1cm left eye QID x 7 days #1 3.5gm RF0 sent to her pharmacy of choice [facility].  Patient to follow up with provider re-evaluation if new or worsening symptoms.  Discussed viral conjunctivitis most common at this time.  Frequent hand washing/hand sanitizer avoid rubbing eyes.  May use cold compresses.  Exitcare handouts on viral and bacterial conjunctivitis sent to patient my chart.  Patient had URI/GI upset after thanksgiving visiting family members had colds and staying with her.  Other sick contacts at work also.  Patient A&OX3 spoke full sentences without difficulty.  Eye symptoms worsen "feels like grit in eyes" after long sessions of computer work.  Encouraged refresh 2 gtts ou TID prn eye irritation/dryness.  Patient verbalized understanding information/instructions, agreed with plan of care and had no further questions at this time.

## 2022-08-08 NOTE — Telephone Encounter (Signed)
Patient reported erythromycin ophthalmic ointment use has improved her symptoms and she is feeling much better.

## 2022-08-14 ENCOUNTER — Telehealth: Payer: Self-pay | Admitting: Registered Nurse

## 2022-08-14 ENCOUNTER — Encounter: Payer: Self-pay | Admitting: Registered Nurse

## 2022-08-14 DIAGNOSIS — R21 Rash and other nonspecific skin eruption: Secondary | ICD-10-CM

## 2022-08-14 MED ORDER — AQUAPHOR EX OINT: TOPICAL_OINTMENT | Freq: Two times a day (BID) | CUTANEOUS | 0 refills | Status: AC | PRN

## 2022-08-14 NOTE — Telephone Encounter (Signed)
Patient reported all symptoms resolved and stopped medications.  Discussed most likely dry eyes/viral/allergic.  May continue refresh drops prn if dry/gritty irritated eyes as needed.  Ketotifen if itching.  Patient verbalized understanding information/instructions and had no further questions or concerns.

## 2022-08-14 NOTE — Telephone Encounter (Signed)
Patient contacted NP "I have had rash on my back since I had GI upset that my family brought from [place] at Thanksgiving.  I thought it was an eczema flare up but my sister took a picture for me last night and now I am wondering if I had the shingles  Should I put cream on or let it dry out?"  See image.  Discussed with patient symptoms of shingles she was unsure if any blisters, weeping then crusting.  Dry now with scab and grouped on one side lower back.  Denied any prodrome symptoms e.g. tingling, pain.  Discussed to treat now I recommended avoiding scratching and use thick emollient twice a day prn (at least daily after bathing to help protect from rubbing clothes/furniture).  Denied swelling/discharge/pain/hot to touch or tender at this time.  If not improving consider adding topical steroid to regimen.  Discussed with patient if she has symptoms of shingles in the future to notify me as antivirals can be prescribed.  I recommended that she have shingles vaccine if none administered in the past after rash resolves.Discussed eczema typically dry irritated skin that worsens over time.  Typically bumpy/scaly not fluid filled pimples that then pop and crust over in shingles.  Shingles typically occurs on only one side of body following a nerve route.  Patient denied history or oral cold sores. Exitcare handouts on shingles and atopic dermatitis sent to patient my chart.   Patient verbalized understanding information/instructions, agreed with plan of care and had no further questions at this time.

## 2022-08-19 ENCOUNTER — Other Ambulatory Visit: Payer: Self-pay | Admitting: Registered Nurse

## 2022-08-19 ENCOUNTER — Telehealth: Payer: Self-pay | Admitting: Registered Nurse

## 2022-08-19 ENCOUNTER — Encounter: Payer: Self-pay | Admitting: Registered Nurse

## 2022-08-19 DIAGNOSIS — E7800 Pure hypercholesterolemia, unspecified: Secondary | ICD-10-CM | POA: Insufficient documentation

## 2022-08-19 DIAGNOSIS — R600 Localized edema: Secondary | ICD-10-CM

## 2022-08-19 DIAGNOSIS — N951 Menopausal and female climacteric states: Secondary | ICD-10-CM | POA: Insufficient documentation

## 2022-08-19 DIAGNOSIS — I10 Essential (primary) hypertension: Secondary | ICD-10-CM

## 2022-08-19 MED ORDER — HYDROCHLOROTHIAZIDE 25 MG PO TABS: 25.0000 mg | ORAL_TABLET | Freq: Every day | ORAL | 0 refills | Status: DC | PRN

## 2022-08-19 NOTE — Telephone Encounter (Signed)
Patient reported typically taking hydrochlorothiazide 25mg  2 tabs daily as leg swelling and elevated home readings if she is only taking 25mg  in am.  Patient stated will run out of pills over holiday vacation otherwise.  Refilled from [facility] for patient and she is to bring BP log and have BP checked with RN .  Last PCM appt 07/07/22 BP 136/88 HR 65 TSH low and patient providers changing her hormones/thyroid medication and she is continuing weight loss efforts.  Substituted hydrochlorothiazide 12.5mg  take 2 tablets am #90 RF0 from [facility] and given hydrochlorothiazide 25mg  take 1 tablet if leg swelling pm/BP greater than 140/90 in afternoon #90 RF0  on 08/18/22 late entry from [facility].  Will recheck renal function/electrolytes within the next 30 days with RN Kirmey nonfasting.  Patient verbalized understanding information/instructions, agreed with plan of care and had no further questions at this time.

## 2022-08-19 NOTE — Telephone Encounter (Signed)
Patient reported she has labs scheduled from [facility] but she is unsure what was ordered to go to [facility].  She will contact their office to find out what labs were ordered since I cannot see them in Epic or [facility] portal.

## 2022-08-29 NOTE — Telephone Encounter (Signed)
Patient seen in workcenter stated hasn't followed up with her provider to see what labs ordered for Feb 2023.  Discussed with patient I need nonfasting prior to that time.  She stated she will schedule appt with RN [facility] next week.  Rash resolved denied further questions or concerns at this time.

## 2022-09-03 ENCOUNTER — Other Ambulatory Visit: Payer: No Typology Code available for payment source | Admitting: Occupational Medicine

## 2022-09-03 ENCOUNTER — Telehealth: Payer: Self-pay | Admitting: Registered Nurse

## 2022-09-03 ENCOUNTER — Encounter: Payer: Self-pay | Admitting: Registered Nurse

## 2022-09-03 DIAGNOSIS — J00 Acute nasopharyngitis [common cold]: Secondary | ICD-10-CM

## 2022-09-03 NOTE — Telephone Encounter (Signed)
Acute rhinitis and watery eyes today; patient given free [place] govt home covid test and recommended testing today and repeat in 48 hours if today negative.  Most likely viral URI as sudden onset in office at work.  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  Patient spoke full sentences without difficulty; no audible cough/congestion/wheezing/shortness of breath.  Positive for nasal congestion/sniffing.  Notify NP if new or worsening symptoms e.g. cough/body aches/fever/n/v/d  Patient verbalized understanding information/instructions, agreed with plan of care and had no further questions at this time.

## 2022-09-04 NOTE — Telephone Encounter (Signed)
Patient notified NP covid home test negative results/sent picture and verified negative results.

## 2022-09-14 ENCOUNTER — Other Ambulatory Visit: Payer: No Typology Code available for payment source | Admitting: Occupational Medicine

## 2022-09-15 ENCOUNTER — Other Ambulatory Visit: Payer: No Typology Code available for payment source | Admitting: Occupational Medicine

## 2022-09-15 DIAGNOSIS — R600 Localized edema: Secondary | ICD-10-CM

## 2022-09-15 DIAGNOSIS — I10 Essential (primary) hypertension: Secondary | ICD-10-CM

## 2022-09-15 NOTE — Progress Notes (Signed)
Lab drawn from [facility] tolerated well no issues noted.   

## 2022-09-16 LAB — BASIC METABOLIC PANEL
BUN/Creatinine Ratio: 14 (ref 9–23)
BUN: 12 mg/dL (ref 6–24)
CO2: 25 mmol/L (ref 20–29)
Calcium: 10 mg/dL (ref 8.7–10.2)
Chloride: 100 mmol/L (ref 96–106)
Creatinine, Ser: 0.87 mg/dL (ref 0.57–1.00)
Glucose: 105 mg/dL — ABNORMAL HIGH (ref 70–99)
Potassium: 4.1 mmol/L (ref 3.5–5.2)
Sodium: 141 mmol/L (ref 134–144)
eGFR: 77 mL/min/{1.73_m2} (ref >59)

## 2022-09-16 NOTE — Telephone Encounter (Signed)
Symptoms resolved feeling well denied concerns.  A&Ox3 respirations even and unlabored RA no rhinitis/eye watering observed spoke full sentences without difficulty. Gait sure and steady.

## 2022-09-17 NOTE — Telephone Encounter (Signed)
Discussed with nonfasting BMET normal and glucose normal for nonfasting.  Continue hydrochlorothiazide 25mg  po as ordered daily with afternoon prn 25mg  dose if BP greater than 140/90 or leg swelling.  Patient reported she is continuing to work on weight loss and increasing activity/decreasing sodium and sugar added in diet.

## 2022-09-22 ENCOUNTER — Encounter: Payer: Self-pay | Admitting: Registered Nurse

## 2022-09-23 ENCOUNTER — Encounter: Payer: Self-pay | Admitting: Occupational Medicine

## 2022-09-24 ENCOUNTER — Ambulatory Visit: Payer: No Typology Code available for payment source | Admitting: Registered Nurse

## 2022-09-24 VITALS — BP 136/86 | HR 64 | Temp 98.7°F

## 2022-09-24 DIAGNOSIS — Z6837 Body mass index (BMI) 37.0-37.9, adult: Secondary | ICD-10-CM

## 2022-09-24 DIAGNOSIS — L309 Dermatitis, unspecified: Secondary | ICD-10-CM

## 2022-09-24 DIAGNOSIS — B079 Viral wart, unspecified: Secondary | ICD-10-CM

## 2022-09-24 DIAGNOSIS — I10 Essential (primary) hypertension: Secondary | ICD-10-CM

## 2022-09-24 NOTE — Patient Instructions (Signed)
Managing Your Hypertension Hypertension, also called high blood pressure, is when the force of the blood pressing against the walls of the arteries is too strong. Arteries are blood vessels that carry blood from your heart throughout your body. Hypertension forces the heart to work harder to pump blood and may cause the arteries to become narrow or stiff. Understanding blood pressure readings A blood pressure reading includes a higher number over a lower number: The first, or top, number is called the systolic pressure. It is a measure of the pressure in your arteries as your heart beats. The second, or bottom number, is called the diastolic pressure. It is a measure of the pressure in your arteries as the heart relaxes. For most people, a normal blood pressure is below 120/80. Your personal target blood pressure may vary depending on your medical conditions, your age, and other factors. Blood pressure is classified into four stages. Based on your blood pressure reading, your health care provider may use the following stages to determine what type of treatment you need, if any. Systolic pressure and diastolic pressure are measured in a unit called millimeters of mercury (mmHg). Normal Systolic pressure: below 120. Diastolic pressure: below 80. Elevated Systolic pressure: 120-129. Diastolic pressure: below 80. Hypertension stage 1 Systolic pressure: 130-139. Diastolic pressure: 80-89. Hypertension stage 2 Systolic pressure: 140 or above. Diastolic pressure: 90 or above. How can this condition affect me? Managing your hypertension is very important. Over time, hypertension can damage the arteries and decrease blood flow to parts of the body, including the brain, heart, and kidneys. Having untreated or uncontrolled hypertension can lead to: A heart attack. A stroke. A weakened blood vessel (aneurysm). Heart failure. Kidney damage. Eye damage. Memory and concentration problems. Vascular  dementia. What actions can I take to manage this condition? Hypertension can be managed by making lifestyle changes and possibly by taking medicines. Your health care provider will help you make a plan to bring your blood pressure within a normal range. You may be referred for counseling on a healthy diet and physical activity. Nutrition  Eat a diet that is high in fiber and potassium, and low in salt (sodium), added sugar, and fat. An example eating plan is called the DASH diet. DASH stands for Dietary Approaches to Stop Hypertension. To eat this way: Eat plenty of fresh fruits and vegetables. Try to fill one-half of your plate at each meal with fruits and vegetables. Eat whole grains, such as whole-wheat pasta, brown rice, or whole-grain bread. Fill about one-fourth of your plate with whole grains. Eat low-fat dairy products. Avoid fatty cuts of meat, processed or cured meats, and poultry with skin. Fill about one-fourth of your plate with lean proteins such as fish, chicken without skin, beans, eggs, and tofu. Avoid pre-made and processed foods. These tend to be higher in sodium, added sugar, and fat. Reduce your daily sodium intake. Many people with hypertension should eat less than 1,500 mg of sodium a day. Lifestyle  Work with your health care provider to maintain a healthy body weight or to lose weight. Ask what an ideal weight is for you. Get at least 30 minutes of exercise that causes your heart to beat faster (aerobic exercise) most days of the week. Activities may include walking, swimming, or biking. Include exercise to strengthen your muscles (resistance exercise), such as weight lifting, as part of your weekly exercise routine. Try to do these types of exercises for 30 minutes at least 3 days a week. Do   not use any products that contain nicotine or tobacco. These products include cigarettes, chewing tobacco, and vaping devices, such as e-cigarettes. If you need help quitting, ask your  health care provider. Control any long-term (chronic) conditions you have, such as high cholesterol or diabetes. Identify your sources of stress and find ways to manage stress. This may include meditation, deep breathing, or making time for fun activities. Alcohol use Do not drink alcohol if: Your health care provider tells you not to drink. You are pregnant, may be pregnant, or are planning to become pregnant. If you drink alcohol: Limit how much you have to: 0-1 drink a day for women. 0-2 drinks a day for men. Know how much alcohol is in your drink. In the [place] one drink equals one 12 oz bottle of beer (355 mL), one 5 oz glass of wine (148 mL), or one 1 oz glass of hard liquor (44 mL). Medicines Your health care provider may prescribe medicine if lifestyle changes are not enough to get your blood pressure under control and if: Your systolic blood pressure is 130 or higher. Your diastolic blood pressure is 80 or higher. Take medicines only as told by your health care provider. Follow the directions carefully. Blood pressure medicines must be taken as told by your health care provider. The medicine does not work as well when you skip doses. Skipping doses also puts you at risk for problems. Monitoring Before you monitor your blood pressure: Do not smoke, drink caffeinated beverages, or exercise within 30 minutes before taking a measurement. Use the bathroom and empty your bladder (urinate). Sit quietly for at least 5 minutes before taking measurements. Monitor your blood pressure at home as told by your health care provider. To do this: Sit with your back straight and supported. Place your feet flat on the floor. Do not cross your legs. Support your arm on a flat surface, such as a table. Make sure your upper arm is at heart level. Each time you measure, take two or three readings one minute apart and record the results. You may also need to have your blood pressure checked regularly by  your health care provider. General information Talk with your health care provider about your diet, exercise habits, and other lifestyle factors that may be contributing to hypertension. Review all the medicines you take with your health care provider because there may be side effects or interactions. Keep all follow-up visits. Your health care provider can help you create and adjust your plan for managing your high blood pressure. Where to find more information National Heart, Lung, and Blood Institute: www.nhlbi.nih.gov American Heart Association: www.heart.org Contact a health care provider if: You think you are having a reaction to medicines you have taken. You have repeated (recurrent) headaches. You feel dizzy. You have swelling in your ankles. You have trouble with your vision. Get help right away if: You develop a severe headache or confusion. You have unusual weakness or numbness, or you feel faint. You have severe pain in your chest or abdomen. You vomit repeatedly. You have trouble breathing. These symptoms may be an emergency. Get help right away. Call 911. Do not wait to see if the symptoms will go away. Do not drive yourself to the hospital. Summary Hypertension is when the force of blood pumping through your arteries is too strong. If this condition is not controlled, it may put you at risk for serious complications. Your personal target blood pressure may vary depending on your medical conditions,   your age, and other factors. For most people, a normal blood pressure is less than 120/80. Hypertension is managed by lifestyle changes, medicines, or both. Lifestyle changes to help manage hypertension include losing weight, eating a healthy, low-sodium diet, exercising more, stopping smoking, and limiting alcohol. This information is not intended to replace advice given to you by your health care provider. Make sure you discuss any questions you have with your health care  provider. Document Revised: 05/08/2021 Document Reviewed: 05/08/2021 Elsevier Patient Education  [place]  Warts are small growths on the skin. They are common and can occur on many areas of the body. A person may have one wart or several warts. In many cases, warts do not need treatment. They usually go away on their own over a period of many months to a few years. If needed, warts that cause problems or do not go away on their own can be treated. What are the causes? Warts are caused by a type of virus called human papillomavirus (HPV). HPV can spread from person to person through direct contact. Warts can also spread to other areas of the body when a person scratches a wart and then scratches another area of his or her body. What increases the risk? You are more likely to develop this condition if: You are 45-54 years old. You have a weakened body defense system (immune system). You are Caucasian. What are the signs or symptoms? The main symptom of this condition is small growths on the skin. Warts may: Be round or oval or have an irregular shape. Have a rough surface. Range in color from skin color to light yellow, brown, or gray. Generally be less than  inch (1.3 cm) in size. Go away and then come back again. Most warts are painless, but some can be painful if they are large or occur in an area of the body where pressure is applied to them, such as the bottom of the foot. How is this diagnosed? A wart can usually be diagnosed based on its appearance. In some cases, a tissue sample may be removed (biopsy) to be looked at under a microscope. How is this treated? In many cases, warts do not need treatment. Sometimes treatment is wanted. If treatment is needed or wanted, options may include: Applying medicated solutions, creams, or patches to the wart. These may be over-the-counter or prescription medicines that make the skin soft so that layers will gradually shed  away. In many cases, the medicine is applied one or two times per day and covered with a bandage. Putting duct tape over the top of the wart (occlusion). You will leave the tape in place for as long as told by your health care provider and then replace it with a new strip of tape. This is done until the wart goes away. Freezing the wart with liquid nitrogen (cryotherapy). Burning the wart with: Laser treatment. An electrified probe (electrocautery). Injection of a medicine into the wart to help the body's immune system fight off the wart. Surgery to remove the wart. Follow these instructions at home: Medicines Use over-the-counter and prescription medicines only as told by your health care provider. Do not use over-the-counter wart medicines on your face or genitals unless your health care provider tells you to do that. Lifestyle Keep your immune system healthy. To do this: Eat a healthy, balanced diet. Get enough sleep. Do not use any products that contain nicotine or tobacco. These products include cigarettes, chewing tobacco, and  vaping devices, such as e-cigarettes. If you need help quitting, ask your health care provider. General instructions  Wash your hands after you touch a wart. Do not scratch or pick at a wart. Avoid shaving hair that is over a wart. Keep all follow-up visits. This is important. Contact a health care provider if: Your warts do not improve after treatment. You have redness, swelling, or pain at the site of a wart. You have bleeding from a wart that does not stop with light pressure. You have diabetes and you develop a wart. Summary Warts are small growths on the skin. They are common and can occur on many areas of the body. In many cases, warts do not need treatment. Sometimes treatment is wanted. If treatment is needed or wanted, there are several treatment options. Apply over-the-counter and prescription medicines only as told by your health care  provider. Wash your hands after you touch a wart. Keep all follow-up visits. This is important. This information is not intended to replace advice given to you by your health care provider. Make sure you discuss any questions you have with your health care provider. Document Revised: 09/26/2021 Document Reviewed: 09/26/2021 Elsevier Patient Education  [place].

## 2022-09-24 NOTE — Progress Notes (Addendum)
Subjective:    Patient ID: Olivia Hamilton, female    DOB: [birth year], 57 y.o.   MRN: [id number]  57y/o established caucasian single female here for wart reshave right thumb wart has been growing again last shave 11/2021.  Dry skin bilateral hands using new lotion that seems to be helping but skin still dry at different times of the day; has started 2 exercise classes this week, working on adjusting and decreasing sodium in diet.  Used bone broth in soup instead of bouillion this week has noticed some ankle swelling today.  Ms Sharilyn Sites not palatable for her  Continuing to work on weight loss and taking hydrochlorothiazide at least daily and prn afternoon if leg swelling or BP greater than 140/90 on home check.  Patient has added chia seeds into diet as heard beneficial for BP lowering.      Review of Systems  Constitutional:  Positive for activity change. Negative for appetite change, diaphoresis and fever.  HENT:  Negative for trouble swallowing and voice change.   Respiratory:  Negative for shortness of breath, wheezing and stridor.   Cardiovascular:  Positive for leg swelling. Negative for chest pain and palpitations.  Gastrointestinal:  Negative for diarrhea, nausea and vomiting.  Genitourinary:  Negative for difficulty urinating.  Musculoskeletal:  Negative for gait problem.  Skin:  Positive for rash.  Neurological:  Negative for dizziness, weakness and light-headedness.  Psychiatric/Behavioral:  Negative for agitation, confusion and sleep disturbance.        Objective:   Physical Exam Vitals and nursing note reviewed.  Constitutional:      General: She is awake. She is not in acute distress.    Appearance: Normal appearance. She is well-developed and well-groomed. She is obese. She is not ill-appearing, toxic-appearing or diaphoretic.  HENT:     Head: Normocephalic and atraumatic.     Jaw: There is normal jaw occlusion.     Salivary Glands: Right salivary gland is not diffusely  enlarged. Left salivary gland is not diffusely enlarged.     Right Ear: Hearing and external ear normal.     Left Ear: Hearing and external ear normal.     Nose: Nose normal. No congestion or rhinorrhea.     Mouth/Throat:     Lips: Pink. No lesions.     Mouth: Mucous membranes are moist.     Pharynx: Oropharynx is clear.  Eyes:     General: Lids are normal. Vision grossly intact. Gaze aligned appropriately. Allergic shiner present. No scleral icterus.       Right eye: No discharge.        Left eye: No discharge.     Extraocular Movements: Extraocular movements intact.     Conjunctiva/sclera: Conjunctivae normal.     Right eye: Right conjunctiva is not injected. No exudate.    Left eye: Left conjunctiva is not injected. No exudate.    Pupils: Pupils are equal, round, and reactive to light.  Neck:     Trachea: Trachea and phonation normal. No tracheal deviation.  Cardiovascular:     Rate and Rhythm: Normal rate and regular rhythm.     Pulses: Normal pulses.          Radial pulses are 2+ on the right side and 2+ on the left side.     Comments: Nonpitting ankle edema 1+/4 bilaterally Pulmonary:     Effort: Pulmonary effort is normal.     Breath sounds: Normal breath sounds and air entry. No stridor or transmitted  upper airway sounds. No wheezing.     Comments: Spoke full sentences without difficulty; no cough observed in exam room Abdominal:     Palpations: Abdomen is soft.  Musculoskeletal:        General: Normal range of motion.     Cervical back: Normal range of motion and neck supple. No rigidity.     Right lower leg: 1+ Edema present.     Left lower leg: 1+ Edema present.  Lymphadenopathy:     Head:     Right side of head: No submandibular or preauricular adenopathy.     Left side of head: No submandibular or preauricular adenopathy.     Cervical: No cervical adenopathy.     Right cervical: No superficial cervical adenopathy.    Left cervical: No superficial cervical  adenopathy.  Skin:    General: Skin is warm and dry.     Capillary Refill: Capillary refill takes less than 2 seconds.     Coloration: Skin is not ashen, cyanotic, jaundiced, mottled, pale or sallow.     Findings: Lesion and rash present. No abrasion, abscess, acne, bruising, burn, ecchymosis, erythema, signs of injury, laceration, petechiae or wound. Rash is scaling. Rash is not crusting, macular, nodular, papular, purpuric, pustular, urticarial or vesicular.     Nails: There is no clubbing.     Comments: Bilateral hands with callous and scaling; right thumb with callous formation nummular 30mm and punctate overlying interphalangeal joint line medial dry no fluctuance; ashy flaky calloused skin overlying dorsum and palmar hands/fingers  Neurological:     General: No focal deficit present.     Mental Status: She is alert and oriented to person, place, and time. Mental status is at baseline.     GCS: GCS eye subscore is 4. GCS verbal subscore is 5. GCS motor subscore is 6.     Cranial Nerves: Cranial nerves 2-12 are intact. No cranial nerve deficit, dysarthria or facial asymmetry.     Sensory: Sensation is intact.     Motor: Motor function is intact. No weakness, tremor, atrophy, abnormal muscle tone or seizure activity.     Coordination: Coordination is intact. Coordination normal.     Gait: Gait is intact. Gait normal.     Comments: In/out of chair without difficulty; gait sure and steady in clinic; bilateral hand grasp equal 5/5  Psychiatric:        Attention and Perception: Attention and perception normal.        Mood and Affect: Mood and affect normal.        Speech: Speech normal.        Behavior: Behavior normal. Behavior is cooperative.        Thought Content: Thought content normal.        Cognition and Memory: Cognition and memory normal.        Judgment: Judgment normal.    Verbal consent obtained for procedure.  Site cleansed with iodine patient denied allergy to iodine.   Treated with shaving callous with #10 blade until pinpoint of blood noted and slight tenderness. Band-Aid and triple antibiotic applied from clinic stock and given 1 week supply triple antibiotic and bandages from clinic stock.   Patient reported minimal pain after procedure. Discussed with patient to keep clean and dry.  This evening apply salacylic acid per manufacturer's instructions otc.    Follow up if wart regrowing in 7-14 days for reshave.  Cryotherapy not available in [facility] clinic.  PCM may have available.  Discussed  with patient 2-3  treatments may be required to eradicate wart. Exitcare handout on warts offered and patient refused. Patient notified to expect scab at treated area but if erythema, purulent drainage, red streak radiating up hand/foot to return to clinic for reevaluation.   Bandaid clean dry and intact on discharge from clinic ambulatory.  EBL minimal less than 21ml.  Bleeding controlled with application bandaid and did not saturate gauze.  Patient agreed with plan of care, verbalized understanding of instructions/information and had no further questions at this time.           Assessment & Plan:   A-essential hypertension, viral wart right thumb subsequent appt, BMI 37, hand dermatitis  P-discussed fresh herbs to make soup more palatable and not using as much sodium.  Continue taking her medication daily.  BP improved today continue exercise and dietary changes and work towards weight loss.  Patient agreed with plan of care and had no further questions at this time.  Reshaved right thumb and patient to use salacytic acid over shaved lesion this week.  Discussed with callous gone acid can penetrate to virus easier after shaving off hard skin.  Discussed PCM may have liquid nitrogen in their office to do freeze but not available in this clinic and to discuss with PCM at next appt if regrowing.  Patient to cover with bandaid until healed and monitor for signs of infection e.g.  swelling/redness/purulent drainage/tenderness and notify clinic staff if these occur.  Follow up reshave prn 7-10 days.  Patient verbalized understanding information/instructions, agreed with plan of care and had no further questions at this time.  Continue follow up with her care team, keep up her new activity and dietary modification making healthier choices for diet and movement through out the week.  Congratulated patient on her healthy choices and encouraged her to continue.  Patient verbalized understanding information/instructions and had no further questions at this time.  Discussed ensure when washing hands wetting with water first then applying soap. Do not apply soap to dry hands and then apply water.  Apply lotion after bathing and in am upon awakening.  Discussed squeeze or scoop emollients preferred over pump.  As pumps tend to have rubbing alcohol to help them flow through pump/drying to skin.  Avoid hot water hand washing use tepid to cool.  Consider paraffin dip during winter months Dec-Feb.  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  Discussed steroid cream not needed at this time but to increase emollient application throughout the day (multiple times).  Patient verbalized understanding information/instructions, agreed with plan of care and had no further questions at this time

## 2022-11-07 ENCOUNTER — Telehealth: Payer: Self-pay | Admitting: Registered Nurse

## 2022-11-07 DIAGNOSIS — A084 Viral intestinal infection, unspecified: Secondary | ICD-10-CM

## 2022-11-07 DIAGNOSIS — K5900 Constipation, unspecified: Secondary | ICD-10-CM | POA: Insufficient documentation

## 2022-11-07 MED ORDER — PROMETHAZINE HCL 6.25 MG/5ML PO SYRP: 6.2500 mg | ORAL_SOLUTION | Freq: Four times a day (QID) | ORAL | 0 refills | Status: DC | PRN

## 2022-11-07 NOTE — Telephone Encounter (Signed)
Patient contacted NP having headache, slight fever woke up with headache today and diarrhea then progressed to vomiting.  To weak to find her thermometer at this time.  Does not have any zofran or phenergan would like drowsy antiemetic so she can sleep.  Electronic Rx sent to her pharmacy of choice promethazine 6.'25mg'$ /45m discussed with patient to start with 1-2 teaspoons every 6 hours and see how she tolerates #120 RF0.  May add another teaspoon if still having vomiting discussed maximum dosing is '25mg'$  per 6 hours (4 teaspoons).  Avoid driving and alcohol after taking medicaiton.  Discussed with patient flu, norovirus and covid are circulating in community.  Consider covid test if no improvement in symptoms with plan of care.  Typically flu has fever.   I have recommended clear fluids and bland diet.  Avoid dairy/spicy, fried and large portions of meat while having nausea.  If vomiting hold po intake x 1 hour.  Then sips clear fluids like broths, ginger ale, power ade, gatorade, pedialyte may advance to soft/bland if no vomiting x 24 hours and appetite returned otherwise hydration main focus.   I have alerted the patient to call if high fever, dehydration, marked weakness, fainting, increased abdominal pain, blood in stool or vomit (red or black).   Exitcare handout on nausea/vomiting and norovirus sent to patient my chart.  Recommended gatorade/nondairy popsicles/ginger ale/broths first line if possible.   Patient verbalized agreement and understanding of treatment plan and had no further questions at this time.

## 2022-11-08 NOTE — Telephone Encounter (Unsigned)
Patient reported she has been too sick to drive to pharmacy to pick up Rx.  Tolerating water feeling weak.  Headache today improves with tylenol every 6 hours.  Hasn't found thermometer to check temperature still feels warm.  Sister sick with similar symptoms and unable to drive also to help her get Rx from pharmacy.  Discussed with patient viruses circulating in community and may have picked it up anywhere store/work/etc.  A&Ox3 spoke full sentences without difficulty. No audible cough/throat clearing/nasal congestion.  Discussed frequent handwashing after bathroom use/eating/touching face when she returns to work this week. Stated tolerating water hasn't tried solids today.  Has soup at home discussed drink broth/sips or popsicles along with water to replace electrolytes.  If headache not responding to hydration/tylenol take her home BP and notify me if out of her normal range.  Patient skipped hydrochlorothiazide yesterday but took '25mg'$  today.  Fatigued.  Does not feel able to go to work and still using bathroom frequently.  Employer policy if fever/diarrhea/vomiting in previous 24 hours to stay at home and contact clinic which patient did.  HR team, RN Kimrey, and her supervisor notified excused absence for tomorrow and will re-evaluate.  Discussed RN Evlyn Kanner will be calling her but if any questions or concerns she can contact me.  Patient going to try tomorrow to get to pharmacy.  Discussed avoid juice/large amounts of sugar as can worsen diarrhea.  Discussed pedialyte, low sugar powerade/gatorade, soup broths recommended to replace electrolytes today/tomorrow.  Then trial of soft bland diet if tolerating clear fluids no vomiting x 24 hours.  Patient agreed with plan of care and had no further questions at this time.  Duration of call 12 minutes.

## 2022-11-10 ENCOUNTER — Encounter: Payer: Self-pay | Admitting: Registered Nurse

## 2022-11-10 ENCOUNTER — Ambulatory Visit: Payer: No Typology Code available for payment source | Admitting: Registered Nurse

## 2022-11-10 VITALS — BP 104/78 | HR 66 | Temp 98.4°F

## 2022-11-10 DIAGNOSIS — M25512 Pain in left shoulder: Secondary | ICD-10-CM

## 2022-11-10 DIAGNOSIS — R5383 Other fatigue: Secondary | ICD-10-CM

## 2022-11-10 DIAGNOSIS — A084 Viral intestinal infection, unspecified: Secondary | ICD-10-CM

## 2022-11-10 DIAGNOSIS — M62838 Other muscle spasm: Secondary | ICD-10-CM

## 2022-11-10 NOTE — Progress Notes (Signed)
Subjective:    Patient ID: Olivia Hamilton, female    DOB: 1966/03/30, 57 y.o.   MRN: [id number]  57y/o single caucasian established female patient here for re-evaluation had vomiting/diarrhea over the weekend.  Last diarrhea yesterday am.  Still fatigued.  Has been drinking water with electrolyte powder.  Had some blueberries, almond crackers last night and tolerating clear liquids without difficulty.  Denied dizziness, headache, fever, chills, vomiting or diarrhea today. Started to have left shoulder pain similar to after her gallbladder surgery today.  Wondering if she should take ibuprofen or tylenol.  Was not able to work yesterday and between trips to bathroom slept this weekend.  Urinated prior to appt medium yellow in toilet without difficulty.  Took '25mg'$  hydrochlorothiazide yesterday.  Hasn't taken blood pressure medication yet this am  "My blood pressure lower than usual when RN checked today"  Abdomen muscles a little sore since this weekend.      Review of Systems  Constitutional:  Positive for activity change, appetite change and fatigue. Negative for chills and diaphoresis.  HENT:  Negative for congestion, sore throat, trouble swallowing and voice change.   Eyes:  Negative for photophobia and visual disturbance.  Respiratory:  Negative for cough, shortness of breath, wheezing and stridor.   Cardiovascular:  Negative for chest pain, palpitations and leg swelling.  Gastrointestinal:  Positive for diarrhea, nausea and vomiting. Negative for abdominal distention, abdominal pain, blood in stool and constipation.  Genitourinary:  Negative for difficulty urinating.  Skin:  Negative for rash.  Neurological:  Negative for dizziness, tremors, seizures, syncope, facial asymmetry, speech difficulty, light-headedness, numbness and headaches.  Hematological:  Negative for adenopathy. Does not bruise/bleed easily.  Psychiatric/Behavioral:  Negative for agitation, confusion and sleep  disturbance.        Objective:   Physical Exam Vitals and nursing note reviewed.  Constitutional:      General: She is awake. She is not in acute distress.    Appearance: Normal appearance. She is well-developed and well-groomed. She is obese. She is not ill-appearing, toxic-appearing or diaphoretic.  HENT:     Head: Normocephalic and atraumatic.     Jaw: There is normal jaw occlusion.     Salivary Glands: Right salivary gland is not diffusely enlarged. Left salivary gland is not diffusely enlarged.     Right Ear: Hearing and external ear normal.     Left Ear: Hearing and external ear normal.     Nose: Nose normal. No congestion or rhinorrhea.     Mouth/Throat:     Lips: Pink. No lesions.     Mouth: Mucous membranes are moist. No oral lesions or angioedema.     Dentition: No gum lesions.     Tongue: No lesions. Tongue does not deviate from midline.     Palate: No mass and lesions.     Pharynx: Oropharynx is clear. Uvula midline.  Eyes:     General: Lids are normal. Vision grossly intact. Gaze aligned appropriately. Allergic shiner present. No scleral icterus.       Right eye: No discharge.        Left eye: No discharge.     Extraocular Movements: Extraocular movements intact.     Conjunctiva/sclera: Conjunctivae normal.     Pupils: Pupils are equal, round, and reactive to light.  Neck:     Trachea: Trachea and phonation normal. No abnormal tracheal secretions or tracheal deviation.     Comments: Bilateral trapezius tight; AROM slightly decreased flexion/extension/rotation bilaterally today  from her baseline Cardiovascular:     Rate and Rhythm: Normal rate and regular rhythm.     Pulses: Normal pulses.          Radial pulses are 2+ on the right side and 2+ on the left side.  Pulmonary:     Effort: Pulmonary effort is normal.     Breath sounds: Normal breath sounds and air entry. No stridor or transmitted upper airway sounds. No wheezing.     Comments: Spoke full sentences  without difficulty; no cough observed in exam room Abdominal:     General: Abdomen is flat. Bowel sounds are normal.     Palpations: Abdomen is soft.     Tenderness: There is no abdominal tenderness. There is no right CVA tenderness, left CVA tenderness, guarding or rebound. Negative signs include Murphy's sign.     Hernia: No hernia is present. There is no hernia in the umbilical area or ventral area.     Comments: Dull to percussion x 4 quads; on/off exam table without difficulty standing to sitting to supine and reversed quickly without assist  Musculoskeletal:     Right hand: Normal strength. Normal capillary refill.     Left hand: Normal strength. Normal capillary refill.     Cervical back: Neck supple. Spasms present. No swelling, edema, deformity, erythema, signs of trauma, lacerations, rigidity, torticollis, tenderness, bony tenderness or crepitus. Pain with movement and muscular tenderness present. Decreased range of motion.     Thoracic back: No swelling, edema, deformity, signs of trauma, lacerations, spasms or tenderness. Normal range of motion.     Right lower leg: No swelling. No edema.     Left lower leg: No swelling. No edema.  Lymphadenopathy:     Head:     Right side of head: No submandibular or preauricular adenopathy.     Left side of head: No submandibular or preauricular adenopathy.     Cervical: No cervical adenopathy.     Right cervical: No superficial, deep or posterior cervical adenopathy.    Left cervical: No superficial, deep or posterior cervical adenopathy.  Skin:    General: Skin is warm and dry.     Capillary Refill: Capillary refill takes less than 2 seconds.     Coloration: Skin is not ashen, cyanotic, jaundiced, mottled, pale or sallow.     Findings: No abrasion, abscess, acne, bruising, burn, ecchymosis, erythema, signs of injury, laceration, lesion, petechiae, rash or wound.     Nails: There is no clubbing.  Neurological:     General: No focal deficit  present.     Mental Status: She is alert and oriented to person, place, and time. Mental status is at baseline.     GCS: GCS eye subscore is 4. GCS verbal subscore is 5. GCS motor subscore is 6.     Cranial Nerves: Cranial nerves 2-12 are intact. No cranial nerve deficit, dysarthria or facial asymmetry.     Sensory: Sensation is intact.     Motor: Motor function is intact. No weakness, tremor, atrophy, abnormal muscle tone or seizure activity.     Coordination: Coordination is intact. Coordination normal.     Gait: Gait is intact. Gait normal.     Comments: In/out of chair and on/off exam table without difficulty; gait sure and steady in clinic; bilateral hand grasp equal 5/5  Psychiatric:        Attention and Perception: Attention and perception normal.        Mood and Affect: Mood  and affect normal.        Speech: Speech normal.        Behavior: Behavior normal. Behavior is cooperative.        Thought Content: Thought content normal.        Cognition and Memory: Cognition and memory normal.        Judgment: Judgment normal.     Patient had to return to office.  Notified RN Evlyn Kanner would contact her when she was finished with other patient to do POCT glucose.  Patient drinking water in exam room without difficulty.  1430 Patient in meeting RN Evlyn Kanner unable to perform POCT glucose.  Will try again before clinic closes 1700.    RN Kimrey notified me 1630 was unable to do POCT glucose today will follow up with patient tomorrow as clinic at her worksite.  If patient asymptomatic tomorrow may cancel POCT glucose.      Assessment & Plan:  A-muscle spasms of neck; acute left shoulder pain, viral gastroenteritis and unspecified fatigue  P-Discussed vomiting in toilet over weekend and in bed longer than usual amount muscles tight spasms noted cervical trapezius most pronounced bilaterally.  Left shoulder was scapular and trapezius also  Thermacare patches x 2 placed cervical and  thoracic/scapular left.  Biofreeze topical 4 UD packets given to patient from clinic stock.  May use tylenol '1000mg'$  po q6h prn pain also.  Discussed ibuprofen harder on stomach and she hasn't transitioned back to much solids yet hold on ibuprofen until eating more solid food.  If vomiting or diarrhea restarts notify clinic staff.   Home stretches demonstrated to patient-e.g. Arm circles, walking up wall, chest stretches, neck AROM, chin tucks, knee to chest and rock side to side on back. Self massage or professional prn, foam roller use or tennis/racquetball.  Heat/cryotherapy 15 minutes QID prn.  Ensure ergonomics correct desk at work avoid repetitive motions if possible/holding phone/laptop in hand use desk/stand and/or break up lifting items into smaller loads/weights. Activity as tolerated.   Follow up if symptoms persist or worsen especially if loss of bowel/bladder control, arm/leg weakness and/or saddle paresthesias.  Exitcare handouts muscle spasms and neck exercises  Patient verbalized agreement and understanding of treatment plan and had no further questions at this time.  P2:  Injury Prevention and Fitness.   Discussed fatigue may be from mild dehydration/low blood sugar/electrolytes still trying to return to normal.  Discussed with patient do not take second '25mg'$  hydrochlorothiazide this week.  Only take '25mg'$  po daily but if dizziness in am when awakening/prior to usual medication time hold and notify clinic staff and hydrate with fluids/eat meal. Continue low sugar gatorade/powerade/soup broth and hydrating.  Pulse normal but BP not at her baseline which can also feel like fatigue to body.  Avoid skipping meals but bland diet and slowly advance back to her normal as tolerated.  Notify clinic staff if diarrhea or vomiting restarts again.  POCT glucose with RN Kimrey to ensure not low glucose causing fatigue today.  Patient agreed with plan of care and had no further questions at this time.  RN Kimrey  notified POCT glucose ordered for patient.

## 2022-11-10 NOTE — Patient Instructions (Signed)
Muscle Cramps and Spasms Muscle cramps and spasms occur when a muscle or muscles tighten and you have no control over this tightening (involuntary muscle contraction). They are a common problem that can happen in any muscle. The most common place is in the calf muscles of the leg. There are a few ways that muscle cramps and spasms differ: Muscle cramps are painful. They come and go and may last for a few seconds or up to 15 minutes. Muscle cramps are often more forceful and last longer than muscle spasms. Muscle spasms may or may not be painful. They may last just a few seconds or last much longer. Certain conditions, such as diabetes or Parkinson's disease, can make you more likely to have cramps or spasms. But in most cases, cramps and spasms are not caused by other conditions. Common causes include: Overexertion. This is when you do more physical work or exercise than your body is ready for. Overuse from doing the same movements too many times. Staying in one position for too long. Improper preparation, form, or technique when playing a sport or doing an activity. Not enough water or other fluids in your body (dehydration). Other causes may include: Injury. Side effects of some medicines. Too few salts and minerals in your body (electrolytes), such as potassium and calcium. This could happen if you are taking water pills (diuretics) or if you are pregnant. In many cases, the cause of muscle cramps or spasms is not known. Follow these instructions at home: Eating and drinking Drink enough fluid to keep your pee (urine) pale yellow. This can help prevent cramps or spasms. Eat a healthy diet that includes a lot of nutrients to help your muscles work. A healthy diet includes fruits and vegetables, lean protein, whole grains, and low-fat or nonfat dairy products. Managing pain and stiffness     Try to massage, stretch, and relax the affected muscle. Do this for a few minutes at a time. If told,  put ice on the muscles. This may help if you are sore or have pain after a cramp or spasm. Put ice in a plastic bag. Place a towel between your skin and the bag. Leave the ice on for 20 minutes, 2-3 times a day. If told, apply heat to tight or tense muscles as often as told by your health care provider. Use the heat source that your provider recommends, such as a moist heat pack or a heating pad. Place a towel between your skin and the heat source. Leave the heat on for 20-30 minutes. If your skin turns bright red, remove the ice or heat right away to prevent skin damage. The risk of damage is higher if you cannot feel pain, heat, or cold. Take hot showers or baths to help relax tight muscles. General instructions If you are having cramps often, avoid intense exercise for a few days. Take over-the-counter and prescription medicines only as told by your provider. Watch for any changes in your symptoms. Contact a health care provider if: Your cramps or spasms get more severe or happen more often. Your cramps or spasms do not get better over time. This information is not intended to replace advice given to you by your health care provider. Make sure you discuss any questions you have with your health care provider. Document Revised: 04/14/2022 Document Reviewed: 04/14/2022 Else[employer] Patient Education  [employer].

## 2022-11-11 NOTE — Telephone Encounter (Signed)
See office visit notes 10/12/22 patient returned to work diarrhea and vomiting resolved but still fatigued.

## 2022-11-11 NOTE — Telephone Encounter (Signed)
Spoke with patient feeling better today still some fatigue diet advanced and closer to her normal today 11/11/22

## 2022-11-18 NOTE — Telephone Encounter (Signed)
Spoke with patient stated drank gatorade low sugar and symptoms resolved feeling back to normal this week.  Denied concerns or concerns.

## 2022-12-10 ENCOUNTER — Telehealth: Payer: Self-pay | Admitting: Registered Nurse

## 2022-12-10 ENCOUNTER — Encounter: Payer: Self-pay | Admitting: Registered Nurse

## 2022-12-10 DIAGNOSIS — I10 Essential (primary) hypertension: Secondary | ICD-10-CM

## 2022-12-10 DIAGNOSIS — R600 Localized edema: Secondary | ICD-10-CM

## 2022-12-10 MED ORDER — HYDROCHLOROTHIAZIDE 12.5 MG PO TABS: 25.0000 mg | ORAL_TABLET | Freq: Every day | ORAL | 0 refills | Status: DC

## 2022-12-10 NOTE — Telephone Encounter (Signed)
Using 2 25mg  tabs po daily typically am and pm due to leg swelling.  Last labs 09/15/22 last fill 08/18/22.  Last BP check 11/10/22 104/78 HR 66  25mg  hydrochlorothiazide out of stock bridge refilled with take 2 tabs 12.5mg  po daily #90 RF0.  Discussed with patient to take 2 tabs as these are 12.5mg  not 25mg  pills and another 2 tabs afternoon if leg swelling or BP greater than 140/90.  Will dispense her other bottle once shipment received.  Patient verbalized understanding information/instructions and had no further questions at this time.  Latest Reference Range & Units 07/07/22 10:01 09/15/22 14:40  Sodium 134 - 144 mmol/L 140 141  Potassium 3.5 - 5.2 mmol/L 4.0 4.1  Chloride 96 - 106 mmol/L 103 100  CO2 20 - 29 mmol/L 27 25  Glucose 70 - 99 mg/dL 84 105 (H)  Mean Plasma Glucose mg/dL 111   BUN 6 - 24 mg/dL 16 12  Creatinine 0.57 - 1.00 mg/dL 0.92 0.87  Calcium 8.7 - 10.2 mg/dL 9.4 10.0  BUN/Creatinine Ratio 9 - 23  SEE NOTE: 14  eGFR >59 mL/min/1.73 73 77  AG Ratio 1.0 - 2.5 (calc) 1.8   AST 10 - 35 U/L 13   ALT 6 - 29 U/L 12   Total Protein 6.1 - 8.1 g/dL 6.2   Total Bilirubin 0.2 - 1.2 mg/dL 0.5   Total CHOL/HDL Ratio <5.0 (calc) 3.2   Cholesterol <200 mg/dL 205 (H)   HDL Cholesterol > OR = 50 mg/dL 64   LDL Cholesterol (Calc) mg/dL (calc) 116 (H)   Non-HDL Cholesterol (Calc) <130 mg/dL (calc) 141 (H)   Triglycerides <150 mg/dL 133   Alkaline phosphatase (APISO) 37 - 153 U/L 88   Globulin 1.9 - 3.7 g/dL (calc) 2.2   WBC 3.8 - 10.8 Thousand/uL 5.3   RBC 3.80 - 5.10 Million/uL 4.89   Hemoglobin 11.7 - 15.5 g/dL 14.6   HCT 35.0 - 45.0 % 43.9   MCV 80.0 - 100.0 fL 89.8   MCH 27.0 - 33.0 pg 29.9   MCHC 32.0 - 36.0 g/dL 33.3   RDW 11.0 - 15.0 % 11.9   Platelets 140 - 400 Thousand/uL 255   MPV 7.5 - 12.5 fL 9.9   eAG (mmol/L) mmol/L 6.2   Hemoglobin A1C <5.7 % of total Hgb 5.5   TSH 0.40 - 4.50 mIU/L 0.01 (L)   T4,Free(Direct) 0.8 - 1.8 ng/dL 1.1   Albumin MSPROF 3.6 - 5.1 g/dL 4.0    Hepatitis C Ab NON-REACTIVE  NON-REACTIVE   HIV NON-REACTIVE  NON-REACTIVE   (H): Data is abnormally high (L): Data is abnormally low

## 2022-12-22 ENCOUNTER — Encounter: Payer: Self-pay | Admitting: Registered Nurse

## 2022-12-22 ENCOUNTER — Telehealth: Payer: Self-pay | Admitting: Registered Nurse

## 2022-12-22 DIAGNOSIS — R600 Localized edema: Secondary | ICD-10-CM

## 2022-12-22 DIAGNOSIS — I10 Essential (primary) hypertension: Secondary | ICD-10-CM

## 2022-12-22 NOTE — Telephone Encounter (Signed)
Shipment received hydrochlorothiazide  po daily prn leg swelling/BP greater than 140/90 pm #90 RF0 dispensed to patient today from [facility] [employer] formulary.  Patient stated leg swelling only resolves if she takes pm  dose along with am  dose and has been taking both consistently.  Walking more and still trying to lose weight and decrease salt intake.  Last fill 12/10/22 12.5mg  hydrochlorothiazide as  out of stock #90.  Last labs 09/15/22 electrolytes and renal function stable.  Last BP 11/10/22 104/78 HR 66 Encouraged patient to continue working on dash diet/exercise and weight loss and notify me when she is running low for next fill.  Patient agreed with plan of care and had no further questions at this time.

## 2022-12-31 ENCOUNTER — Ambulatory Visit: Payer: No Typology Code available for payment source | Admitting: Registered Nurse

## 2022-12-31 VITALS — BP 108/94 | HR 70

## 2022-12-31 DIAGNOSIS — S39012A Strain of muscle, fascia and tendon of lower back, initial encounter: Secondary | ICD-10-CM

## 2022-12-31 DIAGNOSIS — Z6836 Body mass index (BMI) 36.0-36.9, adult: Secondary | ICD-10-CM | POA: Insufficient documentation

## 2022-12-31 MED ORDER — BIOFREEZE 4 % EX GEL: 1.0000 | Freq: Four times a day (QID) | CUTANEOUS | 0 refills | Status: AC | PRN

## 2022-12-31 MED ORDER — ACETAMINOPHEN 500 MG PO TABS: 1000.0000 mg | ORAL_TABLET | Freq: Four times a day (QID) | ORAL | 0 refills | Status: AC | PRN

## 2022-12-31 MED ORDER — IBUPROFEN 800 MG PO TABS: 800.0000 mg | ORAL_TABLET | Freq: Three times a day (TID) | ORAL | 0 refills | Status: AC

## 2022-12-31 NOTE — Progress Notes (Signed)
Subjective:    Patient ID: Olivia Hamilton, female    DOB: [birth year], 57 y.o.   MRN: [id number]  56y/o caucasian female single established here for evaluation low back pain started yesterday after sorting through 10 boxes of work paperwork in [employer] files/historical on the floor bent at waist in am before shred truck arrived.  Denied loss of bowel/bladder control/saddle paresthesias/arm/leg weakness.  Stiffened up during day as she was in meetings/sitting at desk/computer work the rest of the day.  Improved overnight 2/10 now after hot shower this am was stiff on awakening this am.      Review of Systems  Constitutional:  Negative for chills and fever.  HENT:  Negative for trouble swallowing and voice change.   Respiratory:  Negative for cough.   Cardiovascular:  Positive for leg swelling.  Gastrointestinal:  Negative for diarrhea, nausea and vomiting.  Genitourinary:  Negative for difficulty urinating and enuresis.  Musculoskeletal:  Positive for back pain and myalgias. Negative for gait problem.  Skin:  Negative for color change, rash and wound.  Allergic/Immunologic: Positive for environmental allergies.  Psychiatric/Behavioral:  Negative for agitation, confusion and sleep disturbance.        Objective:   Physical Exam Vitals and nursing note reviewed.  Constitutional:      General: She is awake. She is not in acute distress.    Appearance: Normal appearance. She is well-developed and well-groomed. She is obese. She is not ill-appearing, toxic-appearing or diaphoretic.  HENT:     Head: Normocephalic and atraumatic.     Jaw: There is normal jaw occlusion.     Salivary Glands: Right salivary gland is not diffusely enlarged. Left salivary gland is not diffusely enlarged.     Right Ear: Hearing and external ear normal.     Left Ear: Hearing and external ear normal.     Nose: Nose normal. No congestion or rhinorrhea.     Mouth/Throat:     Lips: Pink. No lesions.     Mouth:  Mucous membranes are moist.     Pharynx: Oropharynx is clear.  Eyes:     General: Lids are normal. Vision grossly intact. Gaze aligned appropriately. No scleral icterus.       Right eye: No discharge.        Left eye: No discharge.     Extraocular Movements: Extraocular movements intact.     Conjunctiva/sclera: Conjunctivae normal.     Pupils: Pupils are equal, round, and reactive to light.  Neck:     Trachea: Trachea normal.  Cardiovascular:     Rate and Rhythm: Normal rate and regular rhythm.     Pulses: Normal pulses.          Radial pulses are 2+ on the right side and 2+ on the left side.     Comments: Nonpitting edema left 1+/4 and right trace nonpitting Pulmonary:     Effort: Pulmonary effort is normal.     Breath sounds: Normal breath sounds and air entry. No stridor or transmitted upper airway sounds. No wheezing.     Comments: Spoke full sentences without difficulty; no cough observed in exam room Abdominal:     Palpations: Abdomen is soft.  Musculoskeletal:        General: Signs of injury present. No swelling, tenderness or deformity. Normal range of motion.     Cervical back: Normal range of motion and neck supple. No swelling, edema, deformity, erythema, signs of trauma, lacerations, rigidity or crepitus. No pain with  movement. Normal range of motion.     Thoracic back: No swelling, edema, deformity, signs of trauma or lacerations. Normal range of motion.     Lumbar back: No swelling, edema, deformity, signs of trauma or lacerations. Normal range of motion.       Back:     Right lower leg: No edema.     Left lower leg: 1+ Edema present.  Lymphadenopathy:     Head:     Right side of head: No submandibular or preauricular adenopathy.     Left side of head: No submandibular or preauricular adenopathy.     Cervical: No cervical adenopathy.     Right cervical: No superficial cervical adenopathy.    Left cervical: No superficial cervical adenopathy.  Skin:    General:  Skin is warm and dry.     Capillary Refill: Capillary refill takes less than 2 seconds.     Coloration: Skin is not ashen, cyanotic, jaundiced, mottled, pale or sallow.     Findings: No abrasion, abscess, acne, bruising, burn, ecchymosis, erythema, signs of injury, laceration, lesion, petechiae, rash or wound.     Nails: There is no clubbing.  Neurological:     General: No focal deficit present.     Mental Status: She is alert and oriented to person, place, and time. Mental status is at baseline.     GCS: GCS eye subscore is 4. GCS verbal subscore is 5. GCS motor subscore is 6.     Cranial Nerves: Cranial nerves 2-12 are intact. No cranial nerve deficit, dysarthria or facial asymmetry.     Sensory: Sensation is intact.     Motor: Motor function is intact. No weakness, tremor, atrophy, abnormal muscle tone or seizure activity.     Coordination: Coordination is intact. Coordination normal.     Gait: Gait is intact. Gait normal.     Comments: In/out of chair without difficulty; gait sure and steady in clinic; bilateral hand grasp equal 5/5  Psychiatric:        Attention and Perception: Attention and perception normal.        Mood and Affect: Mood and affect normal.        Speech: Speech normal.        Behavior: Behavior normal. Behavior is cooperative.        Thought Content: Thought content normal.        Cognition and Memory: Cognition and memory normal.        Judgment: Judgment normal.           Assessment & Plan:   A-low back strain initial encounter  P-Ibuprofen 800mg  po TID prn pain OTC or tylenol/acetaminophen 1000mg  po q6h prn pain.  Home stretches demonstrated to patient-e.g. thoracic/lumbar AROM, knee to chest and rock side to side on back. Self massage or professional prn, foam roller use or tennis/racquetball.  Heat/cryotherapy 15 minutes QID prn.  Trial thermacare 1 applied and another given to patient for use tomorrow from clinic stock.  Biofreeze gel topical QID prn  pain.  Discussed if working in boxes again put on table or chair or stock up to waist level instead of prolonged flexion at waist.  Discussed avoid prolonged static positions get up for gentle arom/walking throughout the day when working on computer. Consider physical therapy referral if no improvement with prescribed therapy from [facility] and/or chiropractic care.  Ensure ergonomics correct desk at work avoid repetitive motions if possible/holding phone/laptop in hand use desk/stand and/or break up lifting items  into smaller loads/weights.  Patient was instructed to rest, ice, and ROM exercises.  Activity as tolerated.   Follow up if symptoms persist or worsen especially if loss of bowel/bladder control, arm/leg weakness and/or saddle paresthesias.  Exitcare handout on muscle spasms and low back strain rehab exercises given to patient.  Patient verbalized agreement and understanding of treatment plan and had no further questions at this time.  P2:  Injury Prevention and Fitness.

## 2022-12-31 NOTE — Patient Instructions (Addendum)
Muscle Cramps and Spasms Muscle cramps and spasms occur when a muscle or muscles tighten and you have no control over this tightening (involuntary muscle contraction). They are a common problem that can happen in any muscle. The most common place is in the calf muscles of the leg. There are a few ways that muscle cramps and spasms differ: Muscle cramps are painful. They come and go and may last for a few seconds or up to 15 minutes. Muscle cramps are often more forceful and last longer than muscle spasms. Muscle spasms may or may not be painful. They may last just a few seconds or last much longer. Certain conditions, such as diabetes or Parkinson's disease, can make you more likely to have cramps or spasms. But in most cases, cramps and spasms are not caused by other conditions. Common causes include: Overexertion. This is when you do more physical work or exercise than your body is ready for. Overuse from doing the same movements too many times. Staying in one position for too long. Improper preparation, form, or technique when playing a sport or doing an activity. Not enough water or other fluids in your body (dehydration). Other causes may include: Injury. Side effects of some medicines. Too few salts and minerals in your body (electrolytes), such as potassium and calcium. This could happen if you are taking water pills (diuretics) or if you are pregnant. In many cases, the cause of muscle cramps or spasms is not known. Follow these instructions at home: Eating and drinking Drink enough fluid to keep your pee (urine) pale yellow. This can help prevent cramps or spasms. Eat a healthy diet that includes a lot of nutrients to help your muscles work. A healthy diet includes fruits and vegetables, lean protein, whole grains, and low-fat or nonfat dairy products. Managing pain and stiffness     Try to massage, stretch, and relax the affected muscle. Do this for a few minutes at a time. If told,  put ice on the muscles. This may help if you are sore or have pain after a cramp or spasm. Put ice in a plastic bag. Place a towel between your skin and the bag. Leave the ice on for 20 minutes, 2-3 times a day. If told, apply heat to tight or tense muscles as often as told by your health care provider. Use the heat source that your provider recommends, such as a moist heat pack or a heating pad. Place a towel between your skin and the heat source. Leave the heat on for 20-30 minutes. If your skin turns bright red, remove the ice or heat right away to prevent skin damage. The risk of damage is higher if you cannot feel pain, heat, or cold. Take hot showers or baths to help relax tight muscles. General instructions If you are having cramps often, avoid intense exercise for a few days. Take over-the-counter and prescription medicines only as told by your provider. Watch for any changes in your symptoms. Contact a health care provider if: Your cramps or spasms get more severe or happen more often. Your cramps or spasms do not get better over time. This information is not intended to replace advice given to you by your health care provider. Make sure you discuss any questions you have with your health care provider. Document Revised: 04/14/2022 Document Reviewed: 04/14/2022 Elsevier Patient Education  2023 [employer] Lumbar Strain A lumbar strain, which is sometimes called a low-back strain, is a stretch or tear in a  muscle or tendons in the lower back (lumbar spine). Tendons are the strong cords of tissue that attach muscle to bone. This type of injury occurs when muscles or tendons are torn or are stretched beyond their limits. Lumbar strains can range from mild to severe. Mild strains may involve stretching a muscle or tendon without tearing it. These may heal in 1-2 weeks. More severe strains involve tearing of muscle fibers or tendons. These will cause more pain and may take 6-8 weeks to  heal. What are the causes? This condition may be caused by: Trauma, such as a fall or a hit to the body. Twisting or overstretching the back. This may result from doing activities that take a lot of energy, such as lifting heavy objects. What increases the risk? A lumbar strain is more common in: Athletes. People with obesity. People who do repeated lifting, bending, or other movements that involve their back. What are the signs or symptoms? Symptoms of this condition may include: Sharp or dull pain in the lower back that does not go away. The pain may spread to the buttocks. Stiffness or limited range of motion. Sudden muscle tightening (spasms). How is this diagnosed? This condition may be diagnosed based on: Your symptoms. Your medical history. A physical exam. Imaging tests, such as: X-rays. MRI. How is this treated? Treatment for this condition may include: Rest. Applying heat and cold to the affected area. Over-the-counter medicines to help with pain and inflammation, such as NSAIDs. Prescription medicine for pain or to relax the muscles. These may be needed for a short time. Physical therapy exercises to improve movement and strength. Follow these instructions at home: Managing pain, stiffness, and swelling     If told, put ice on the injured area during the first 24 hours after your injury. Put ice in a plastic bag. Place a towel between your skin and the bag. Leave the ice on for 20 minutes, 2-3 times a day. If told, apply heat to the affected area as often as told by your health care provider. Use the heat source that your health care provider recommends, such as a moist heat pack or a heating pad. Place a towel between your skin and the heat source. Leave the heat on for 20-30 minutes. If your skin turns bright red, remove the ice or heat right away to prevent skin damage. The risk of damage is higher if you cannot feel pain, heat, or cold. Activity Rest and  return to your normal activities as told by your health care provider. Ask your health care provider what activities are safe for you. Do exercises as told by your health care provider. General instructions Take over-the-counter and prescription medicines only as told by your health care provider. Ask your health care provider if the medicine prescribed to you: Requires you to avoid driving or using machinery. Can cause constipation. You may need to take these actions to prevent or treat constipation: Drink enough fluid to keep your urine pale yellow. Take over-the-counter or prescription medicines. Eat foods that are high in fiber, such as beans, whole grains, and fresh fruits and vegetables. Limit foods that are high in fat and processed sugars, such as fried or sweet foods. Do not use any products that contain nicotine or tobacco. These products include cigarettes, chewing tobacco, and vaping devices, such as e-cigarettes. If you need help quitting, ask your health care provider. How is this prevented? To prevent a future low-back injury: Always warm up properly before  physical activity or sports. Cool down and stretch after being active. Use correct form when playing sports and lifting heavy objects. Bend your knees before you lift heavy objects. Use good posture when sitting and standing. Stay physically fit and keep a healthy weight. Do at least 150 minutes of moderate intensity exercise each week, such as brisk walking or water aerobics. Do strength exercises at least 2 times each week. Contact a health care provider if: Your back pain does not improve after several weeks of treatment. Your symptoms get worse. You have a fever. Get help right away if: Your back pain is severe. You are unable to stand or walk. You develop pain in your legs. You have weakness in your buttocks or legs. You have trouble controlling when you urinate or when you have a bowel movement. You have  frequent, painful, or bloody urination. This information is not intended to replace advice given to you by your health care provider. Make sure you discuss any questions you have with your health care provider. Document Revised: 03/17/2022 Document Reviewed: 03/17/2022 Elsevier Patient Education  2023 [employer]. Low Back Sprain or Strain Rehab Ask your health care provider which exercises are safe for you. Do exercises exactly as told by your health care provider and adjust them as directed. It is normal to feel mild stretching, pulling, tightness, or discomfort as you do these exercises. Stop right away if you feel sudden pain or your pain gets worse. Do not begin these exercises until told by your health care provider. Stretching and range-of-motion exercises These exercises warm up your muscles and joints and improve the movement and flexibility of your back. These exercises also help to relieve pain, numbness, and tingling. Lumbar rotation  Lie on your back on a firm bed or the floor with your knees bent. Straighten your arms out to your sides so each arm forms a 90-degree angle (right angle) with a side of your body. Slowly move (rotate) both of your knees to one side of your body until you feel a stretch in your lower back (lumbar). Try not to let your shoulders lift off the floor. Hold this position for _____30_____ seconds. Tense your abdominal muscles and slowly move your knees back to the starting position. Repeat this exercise on the other side of your body. Repeat _____3_____ times. Complete this exercise _____2_____ times a day. Single knee to chest  Lie on your back on a firm bed or the floor with both legs straight. Bend one of your knees. Use your hands to move your knee up toward your chest until you feel a gentle stretch in your lower back and buttock. Hold your leg in this position by holding on to the front of your knee. Keep your other leg as straight as possible. Hold  this position for ____30______ seconds. Slowly return to the starting position. Repeat with your other leg. Repeat _____3_____ times. Complete this exercise _____2_____ times a day. Prone extension on elbows  Lie on your abdomen on a firm bed or the floor (prone position). Prop yourself up on your elbows. Use your arms to help lift your chest up until you feel a gentle stretch in your abdomen and your lower back. This will place some of your body weight on your elbows. If this is uncomfortable, try stacking pillows under your chest. Your hips should stay down, against the surface that you are lying on. Keep your hip and back muscles relaxed. Hold this position for ____30______ seconds.  Slowly relax your upper body and return to the starting position. Repeat ___3_______ times. Complete this exercise ___2_______ times a day. Strengthening exercises These exercises build strength and endurance in your back. Endurance is the ability to use your muscles for a long time, even after they get tired. Pelvic tilt This exercise strengthens the muscles that lie deep in the abdomen. Lie on your back on a firm bed or the floor with your legs extended. Bend your knees so they are pointing toward the ceiling and your feet are flat on the floor. Tighten your lower abdominal muscles to press your lower back against the floor. This motion will tilt your pelvis so your tailbone points up toward the ceiling instead of pointing to your feet or the floor. To help with this exercise, you may place a small towel under your lower back and try to push your back into the towel. Hold this position for ______30____ seconds. Let your muscles relax completely before you repeat this exercise. Repeat ____3______ times. Complete this exercise _____2_____ times a day. Alternating arm and leg raises  Get on your hands and knees on a firm surface. If you are on a hard floor, you may want to use padding, such as an exercise  mat, to cushion your knees. Line up your arms and legs. Your hands should be directly below your shoulders, and your knees should be directly below your hips. Lift your left leg behind you. At the same time, raise your right arm and straighten it in front of you. Do not lift your leg higher than your hip. Do not lift your arm higher than your shoulder. Keep your abdominal and back muscles tight. Keep your hips facing the ground. Do not arch your back. Keep your balance carefully, and do not hold your breath. Hold this position for ___30_______ seconds. Slowly return to the starting position. Repeat with your right leg and your left arm. Repeat _______3___ times. Complete this exercise _____2_____ times a day. Abdominal set with straight leg raise  Lie on your back on a firm bed or the floor. Bend one of your knees and keep your other leg straight. Tense your abdominal muscles and lift your straight leg up, 4-6 inches (10-15 cm) off the ground. Keep your abdominal muscles tight and hold this position for ___30_______ seconds. Do not hold your breath. Do not arch your back. Keep it flat against the ground. Keep your abdominal muscles tense as you slowly lower your leg back to the starting position. Repeat with your other leg. Repeat ____3______ times. Complete this exercise _____2_____ times a day. Single leg lower with bent knees Lie on your back on a firm bed or the floor. Tense your abdominal muscles and lift your feet off the floor, one foot at a time, so your knees and hips are bent in 90-degree angles (right angles). Your knees should be over your hips and your lower legs should be parallel to the floor. Keeping your abdominal muscles tense and your knee bent, slowly lower one of your legs so your toe touches the ground. Lift your leg back up to return to the starting position. Do not hold your breath. Do not let your back arch. Keep your back flat against the ground. Repeat with  your other leg. Repeat _____3_____ times. Complete this exercise ___2_______ times a day. Posture and body mechanics Good posture and healthy body mechanics can help to relieve stress in your body's tissues and joints. Body mechanics refers to the  movements and positions of your body while you do your daily activities. Posture is part of body mechanics. Good posture means: Your spine is in its natural S-curve position (neutral). Your shoulders are pulled back slightly. Your head is not tipped forward (neutral). Follow these guidelines to improve your posture and body mechanics in your everyday activities. Standing  When standing, keep your spine neutral and your feet about hip-width apart. Keep a slight bend in your knees. Your ears, shoulders, and hips should line up. When you do a task in which you stand in one place for a long time, place one foot up on a stable object that is 2-4 inches (5-10 cm) high, such as a footstool. This helps keep your spine neutral. Sitting  When sitting, keep your spine neutral and keep your feet flat on the floor. Use a footrest, if necessary, and keep your thighs parallel to the floor. Avoid rounding your shoulders, and avoid tilting your head forward. When working at a desk or a computer, keep your desk at a height where your hands are slightly lower than your elbows. Slide your chair under your desk so you are close enough to maintain good posture. When working at a computer, place your monitor at a height where you are looking straight ahead and you do not have to tilt your head forward or downward to look at the screen. Resting When lying down and resting, avoid positions that are most painful for you. If you have pain with activities such as sitting, bending, stooping, or squatting, lie in a position in which your body does not bend very much. For example, avoid curling up on your side with your arms and knees near your chest (fetal position). If you have  pain with activities such as standing for a long time or reaching with your arms, lie with your spine in a neutral position and bend your knees slightly. Try the following positions: Lying on your side with a pillow between your knees. Lying on your back with a pillow under your knees. Lifting  When lifting objects, keep your feet at least shoulder-width apart and tighten your abdominal muscles. Bend your knees and hips and keep your spine neutral. It is important to lift using the strength of your legs, not your back. Do not lock your knees straight out. Always ask for help to lift heavy or awkward objects. This information is not intended to replace advice given to you by your health care provider. Make sure you discuss any questions you have with your health care provider. Document Revised: 11/11/2020 Document Reviewed: 11/11/2020 Elsevier Patient Education  2023 [employer].

## 2023-01-21 ENCOUNTER — Encounter: Payer: Self-pay | Admitting: Registered Nurse

## 2023-01-21 ENCOUNTER — Telehealth: Payer: Self-pay | Admitting: Registered Nurse

## 2023-01-21 DIAGNOSIS — Z13820 Encounter for screening for osteoporosis: Secondary | ICD-10-CM

## 2023-01-21 NOTE — Telephone Encounter (Signed)
Patient had BMD with PCM and has questions on her results that they state normal but when she googled results it stated they were not normal.  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  Discussed with patient I would send further information to her my chart later today with BMD interpretation and calcium rich foods.  Patient verbalized understanding and had no further questions at this time.  Results dated 01/13/23 ap spine L1, L3, L4 t score -0.3 normal; femoral neck left -0.6 normal and femoral neck right -0.5 t score normal.  Patient sent information on BMD interpretation  Patient ordering provider at [facility] Dr Aaron Edelman instructions stated improved from previous results continue current regimen.  https://www.niams.nih.gov/health-topics/bone-mineral-density-tests-what-numbers-mean#:~:text=%E2%80%931%20or%20higher%2C%20your%20bone,lower%2C%20you%77might%20have%20osteoporosis.  My chart message sent to patient

## 2023-01-31 NOTE — Telephone Encounter (Signed)
Patient wanted to discuss t scores.  Discussed all in normal range for age not osteopenia or osteoporosis.  She discussed if she needs to take more calcium supplement discussed calcium level normal on most recent labs and I do not recommend taking more tablets but to eat variety of foods that have calcium.  She stated cannot have dairy due to intolerance.  Discussed I sent foods list high in calcium to her and her dairy alternative yogurt will have calcium in it also she stated she tends to use coconut based yogurts.  Discussed too much calcium supplement intake could increase risk for kidney stones ensure drinking water if taking any supplements avoid dehydration.  Patient verbalized understanding information/instructions and had no further questions at this time.

## 2023-02-10 ENCOUNTER — Telehealth: Payer: Self-pay | Admitting: Registered Nurse

## 2023-02-10 ENCOUNTER — Encounter: Payer: Self-pay | Admitting: Registered Nurse

## 2023-02-10 DIAGNOSIS — Z0000 Encounter for general adult medical examination without abnormal findings: Secondary | ICD-10-CM

## 2023-02-10 NOTE — Telephone Encounter (Signed)
Patient had labs with other providers see [facility] portal  BP LDL and Hgba1c met Be Well requirement please complete paperwork with patient   Cholesterol [Mass/volume] in Serum or Plasma mg/dL 161 H <096    []  Cholesterol in HDL [Mass/volume] in Serum or Plasma mg/dL 64 > OR = 50   []  Triglyceride [Mass/volume] in Serum or Plasma mg/dL 045 <409   []  Cholesterol in LDL [Mass/volume] in Serum or Plasma by calculation mg/dL (calc) 811 H  speaker_notes  []  Cholesterol.total/Cholesterol in HDL [Mass Ratio] in Serum or Plasma (calc) 3.2 <5.0   []  Cholesterol non HDL [Mass/volume] in Serum or Plasma mg/dL (calc) 914 H <782           Hemoglobin A1c/Hemoglobin.total in Blood % of total Hgb 5.5 <5.7 speaker_notes   []  Glucose mean value [Mass/volume] in Blood Estimated from glycated hemoglobin mg/dL 956    []  Glucose mean value [Moles/volume] in Blood Estimated from glycated hemoglobin mmol/L 6.2     104/78 (BP Location: Left Arm, Patient Position: Sitting, Cuff Size: Large)   Pulse 66   Temp 98.4 F (36.9 C)   SpO2 99% on 11/10/2022 Repeat weight/height when paperwork signed.

## 2023-02-16 ENCOUNTER — Ambulatory Visit: Payer: No Typology Code available for payment source | Admitting: Occupational Medicine

## 2023-02-16 VITALS — BP 128/82 | Wt 219.2 lb

## 2023-02-16 DIAGNOSIS — Z0000 Encounter for general adult medical examination without abnormal findings: Secondary | ICD-10-CM

## 2023-02-16 NOTE — Progress Notes (Signed)
Be well insurance premium discount evaluation: Met   Patient completed PCM office visit. Epic reviewed by RN Kimrey transcribed labs and reviewed with the patient. Scheduled follow up labs.   Tobacco attestation signed. [facility] ROI formed signed. Forms placed in the chart.   Patient given handouts for [facility] pharmacies and discount drugs list, MyChart, [facility], [facility], [place] [facility] and [facility].  What to do for infectious illness protocol. Given handout for list of medications that can be filled at [facility]. Given Clinic hours and Clinic Email.  

## 2023-02-18 ENCOUNTER — Telehealth: Payer: Self-pay | Admitting: Occupational Medicine

## 2023-02-18 NOTE — Telephone Encounter (Signed)
Patient reports started having severe diarrhea yesterday at 4 pm and had to leave work.  Today states no more diarrhea. Never had nausea or vomiting. No fever. Patient reports severe fatigue today.  I have recommended clear fluids and bland diet.  Avoid dairy/spicy, fried and large portions of meat while having nausea.  clear fluids like broths, ginger ale, power ade, gatorade, pedialyte may advance to soft/bland if no vomiting x 24 hours and appetite returned otherwise hydration main focus.     Avoid dairy, spicy and fried foods until diarrhea resolves. Avoid dehydration drink noncaffeinated beverages (water, ginger ale, soup broth, popsicles, no sugar added gatorade/powerade) to urinate every 2-4 hours pale yellow urine.  It is easy to become dehydrated when having diarrhea along with electrolyte imbalances.  Return to the clinic if symptoms persist or worsen; I have alerted the patient to call if high fever, dehydration, marked weakness, fainting, increased abdominal pain, blood in stool or vomit. Patient send home due to infectious disease policy. HR NP and Supervisor made aware not able to return till 24 hrs free of symptoms. Patient verbalized agreement and understanding of treatment plan and had no further questions at this time.

## 2023-02-19 NOTE — Telephone Encounter (Signed)
Patient completed Be Well paperwork with RN Kimrey 02/16/23   128/82 (BP Location: Left Arm, Patient Position: Sitting, Cuff Size: Large)   Wt 219 lb 3.2 oz (99.4 kg)   BMI 35.92 kg/m   BSA 2.14 m  Weight 2023 Be Well 232lbs on 12/09/21 BP 116/97  Congratulated patient on improvement as she has been working on diet/exercise and taking her medication regularly.  I signed 2025 Be Well paperwork 02/18/23

## 2023-02-22 ENCOUNTER — Encounter: Payer: Self-pay | Admitting: Registered Nurse

## 2023-02-22 ENCOUNTER — Telehealth: Payer: Self-pay | Admitting: Registered Nurse

## 2023-02-22 DIAGNOSIS — R519 Headache, unspecified: Secondary | ICD-10-CM

## 2023-02-22 NOTE — Telephone Encounter (Signed)
Patient notified NP she did not return to work on Friday due to GI upset and over the weekend had sinus headache and upper respiratory symptoms unsure if allergy related or viral.  Discussed with patient to schedule appt with me tomorrow if no improvement in symptoms and consider home covid test.

## 2023-03-03 NOTE — Telephone Encounter (Signed)
Patient did not need appt symptoms improving encounter closed.  A&Ox3 spoke full sentences without difficulty skin warm dry and pink no audible congestion nasal or cough observed 02/25/23 patient office gait sure and steady respirations even and unlabored

## 2023-03-25 ENCOUNTER — Encounter: Payer: Self-pay | Admitting: Registered Nurse

## 2023-03-25 ENCOUNTER — Telehealth: Payer: Self-pay | Admitting: Registered Nurse

## 2023-03-25 DIAGNOSIS — I10 Essential (primary) hypertension: Secondary | ICD-10-CM

## 2023-03-25 DIAGNOSIS — R600 Localized edema: Secondary | ICD-10-CM

## 2023-03-25 MED ORDER — HYDROCHLOROTHIAZIDE 25 MG PO TABS: 25.0000 mg | ORAL_TABLET | Freq: Every day | ORAL | 1 refills | Status: DC

## 2023-03-25 MED ORDER — HYDROCHLOROTHIAZIDE 25 MG PO TABS: 25.0000 mg | ORAL_TABLET | Freq: Every day | ORAL | 1 refills | Status: DC | PRN

## 2023-03-25 NOTE — Telephone Encounter (Addendum)
Patient last fill 12/22/22 90 tabs

## 2023-04-01 ENCOUNTER — Telehealth: Payer: Self-pay | Admitting: Registered Nurse

## 2023-04-01 DIAGNOSIS — Z20822 Contact with and (suspected) exposure to covid-19: Secondary | ICD-10-CM

## 2023-04-01 MED ORDER — COVID-19 ANTIGEN TEST VI KIT: 1.0000 | PACK | Freq: Every day | 3 refills | Status: DC | PRN

## 2023-04-02 ENCOUNTER — Encounter: Payer: Self-pay | Admitting: Registered Nurse

## 2023-04-02 NOTE — Telephone Encounter (Signed)
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  Discussed test immediately if symptoms and if negative retest again in 48 hours x 2.  If no symptoms test 6 days after exposure x 1.  Wear mask when around others; monitor for symptoms runny nose, congestion, sore throat, body aches, headache, loss of taste/smell, cough, fever, chills or fatigue x 10 days.  Discussed close contact 15 minutes within 6 feet without mask in 24 hours.  Discussed wear mask at work 10 days and no eating in employee lunch room x 10 days.  Patient asymptomatic at this time, agreed with plan of care and had no further questions at this time.  Electronic Rx sent to her pharmacy of choice covid antigen test daily prn use #4 RF1.  Patient sent email with CDC covid information and discussed cases [place] and [place] increasing/junior olympics being held in [place] this week expect more possible cases if out in community this week.  Showed patient infographic with current national rates/color codes of wastewater levels across the nation.  Discussed locally [place] highest levels at this time but increasing in [place] and [place].

## 2023-04-09 NOTE — Telephone Encounter (Signed)
Patient denied symptoms or positive test.  Started using freestyle libre continuous glucose monitor for 1 month trial.  Assisted patient with scanning sensor after she had applied to left posterior arm.  First reading 106 after sipping protein drink the hours prior during a meeting (nonfasting).  Discussed with patient this is a good reading for nonfasting, protein drink not giving her a sugar spike. She asked if level should be higher after eating discussed depends on amount of sugars/carbohydrates in her foods.  Discussed level should be less than 127 2 hours after a meal or fasting in am. Patient verbalized understanding information/instructions and had no further questions at that time.

## 2023-04-14 NOTE — Telephone Encounter (Signed)
Patient feeling well denied concerns A&Ox3 respirations even and unlabored no audible cough/congestion/throat clearing/rhinitis.  Skin warm dry and pink gait sure and steady in workcenter 04/13/23    Patient reported home covid test negative and continues asymptomatic 04/14/23.  Stated continuous glucose monitoring working well for her denied questions or concerns with device or her numbers.  Plans to use 1 month only to assist with her prediabetes/weight loss efforts.

## 2023-04-26 ENCOUNTER — Ambulatory Visit: Payer: No Typology Code available for payment source

## 2023-04-26 DIAGNOSIS — R5383 Other fatigue: Secondary | ICD-10-CM

## 2023-04-26 MED ORDER — CYANOCOBALAMIN 1000 MCG/ML IJ SOLN
1000.0000 ug | Freq: Once | INTRAMUSCULAR | Status: AC
Start: 2023-04-26 — End: 2023-04-26
  Administered 2023-04-26: 1000 ug via INTRAMUSCULAR

## 2023-05-04 ENCOUNTER — Telehealth: Payer: Self-pay | Admitting: Registered Nurse

## 2023-05-05 NOTE — Telephone Encounter (Signed)
Patient asked if could get copy of dexa scan results.  Notified patient she would need to contact medical forms release office to obtain copy if done at [facility]/check my chart portal/or patient portal for office she had procedure performed if not on Epic platform.  Discussed with patient I would check if performed at [facility].  Epic reviewed no results noted in epic under imaging.  Patient stated she would check her other patient portals for results as she needs copy to file for reimbursement for visit from her disability insurance/wellness benefit.  Patient verbalized understanding information and had no further questions at that time.

## 2023-05-19 ENCOUNTER — Other Ambulatory Visit (HOSPITAL_BASED_OUTPATIENT_CLINIC_OR_DEPARTMENT_OTHER): Payer: Self-pay

## 2023-05-19 MED ORDER — WEGOVY 0.5 MG/0.5ML ~~LOC~~ SOAJ
0.5000 mg | SUBCUTANEOUS | 3 refills | Status: DC
Start: 2023-05-10 — End: 2024-04-20
  Filled 2023-05-19: qty 2, 28d supply, fill #0
  Filled 2023-06-15: qty 2, 28d supply, fill #1

## 2023-05-20 ENCOUNTER — Other Ambulatory Visit (HOSPITAL_BASED_OUTPATIENT_CLINIC_OR_DEPARTMENT_OTHER): Payer: Self-pay

## 2023-05-21 ENCOUNTER — Other Ambulatory Visit (HOSPITAL_BASED_OUTPATIENT_CLINIC_OR_DEPARTMENT_OTHER): Payer: Self-pay

## 2023-05-26 ENCOUNTER — Ambulatory Visit: Payer: No Typology Code available for payment source

## 2023-05-26 NOTE — Progress Notes (Signed)
EE in clinic this morning for her scheduled Vitamin B12 injection.  Injection given in right deltoid and tolerated well by EE.  Cyanocobalamin Injection 1,000 mcg/ml.  Lot [id number]  exp. 08/2024.  Reece Packer, RN, BSN, MPH, [facility]_S

## 2023-05-31 ENCOUNTER — Ambulatory Visit: Payer: No Typology Code available for payment source

## 2023-05-31 NOTE — Progress Notes (Signed)
EE came to clinic c/o dizziness and just feeling "off" requesting blood sugar check.  Had breakfast bar early this mornign but nothing since. O:  blood sugar checked and is 58 currently. P:  Given 2 pieces of hard candy and blood sugar elevated to 64 and sx improved.  Advised have another piece of candy and go get her lunch and be sure to include protein as well as CHO with the lunch.   Reece Packer, RN, BSN, MPH, [facility]

## 2023-06-09 ENCOUNTER — Other Ambulatory Visit (HOSPITAL_BASED_OUTPATIENT_CLINIC_OR_DEPARTMENT_OTHER): Payer: Self-pay

## 2023-06-09 MED ORDER — ZEPBOUND 2.5 MG/0.5ML ~~LOC~~ SOAJ
2.5000 mg | SUBCUTANEOUS | 1 refills | Status: DC
Start: 2023-06-09 — End: 2024-04-20
  Filled 2023-06-09: qty 2, 28d supply, fill #0

## 2023-06-09 MED ORDER — ZEPBOUND 2.5 MG/0.5ML ~~LOC~~ SOAJ
2.5000 mg | SUBCUTANEOUS | 1 refills | Status: DC
  Filled 2023-06-09 – 2023-07-14 (×3): qty 2, 28d supply, fill #0
  Filled 2023-08-06: qty 2, 28d supply, fill #1

## 2023-06-15 ENCOUNTER — Other Ambulatory Visit (HOSPITAL_BASED_OUTPATIENT_CLINIC_OR_DEPARTMENT_OTHER): Payer: Self-pay

## 2023-06-17 ENCOUNTER — Other Ambulatory Visit (HOSPITAL_BASED_OUTPATIENT_CLINIC_OR_DEPARTMENT_OTHER): Payer: Self-pay

## 2023-07-01 ENCOUNTER — Ambulatory Visit: Payer: No Typology Code available for payment source | Admitting: Registered Nurse

## 2023-07-01 ENCOUNTER — Encounter: Payer: Self-pay | Admitting: Registered Nurse

## 2023-07-01 VITALS — BP 110/78 | HR 62 | Resp 16

## 2023-07-01 DIAGNOSIS — I8312 Varicose veins of left lower extremity with inflammation: Secondary | ICD-10-CM

## 2023-07-01 NOTE — Progress Notes (Signed)
Subjective:    Patient ID: Olivia Hamilton, female    DOB: 21-Feb-1966, 57 y.o.   MRN: [id number]  56y/o caucasian established female patient here for evaluation left leg pain and veins larger than normal.  Denied red rash/streaks, discharge, fever, chills, trouble walking.  Feels a pulling sensation behind left knee.  Legs less swelling than usual since she has lost 25 lbs on purpose, walking more for exercise and watching diet. Blood pressure has improved also. Typically does not wear compression socks  When she first started walking for exercise and weight loss she was wearing tights and walking after work but now tending to walk at work at lunchtime and not wearing any compression garments.        Review of Systems  Constitutional:  Positive for activity change. Negative for chills, fatigue and fever.  HENT:  Negative for trouble swallowing and voice change.   Eyes:  Negative for photophobia and visual disturbance.  Respiratory:  Negative for cough, choking, chest tightness, shortness of breath, wheezing and stridor.   Cardiovascular:  Positive for leg swelling. Negative for chest pain.  Genitourinary:  Negative for difficulty urinating.  Musculoskeletal:  Positive for myalgias. Negative for gait problem.  Skin:  Negative for color change, pallor, rash and wound.  Allergic/Immunologic: Positive for environmental allergies.  Neurological:  Negative for tremors, syncope, speech difficulty, weakness, numbness and headaches.  Psychiatric/Behavioral:  Negative for agitation, confusion and sleep disturbance.        Objective:   Physical Exam Vitals and nursing note reviewed.  Constitutional:      General: She is awake. She is not in acute distress.    Appearance: Normal appearance. She is well-developed and well-groomed. She is obese. She is not ill-appearing, toxic-appearing or diaphoretic.  HENT:     Head: Normocephalic and atraumatic.     Jaw: There is normal jaw occlusion.      Salivary Glands: Right salivary gland is not diffusely enlarged. Left salivary gland is not diffusely enlarged.     Right Ear: Hearing and external ear normal. No decreased hearing noted.     Left Ear: Hearing and external ear normal. No decreased hearing noted.     Nose: Nose normal. No congestion or rhinorrhea.     Mouth/Throat:     Lips: Pink. No lesions.     Mouth: Mucous membranes are moist. No oral lesions or angioedema.     Dentition: No gum lesions.     Tongue: No lesions. Tongue does not deviate from midline.     Palate: No mass and lesions.     Pharynx: Oropharynx is clear. Uvula midline.  Eyes:     General: Lids are normal. Vision grossly intact. Gaze aligned appropriately. Allergic shiner present. No scleral icterus.       Right eye: No discharge.        Left eye: No discharge.     Extraocular Movements: Extraocular movements intact.     Conjunctiva/sclera: Conjunctivae normal.     Pupils: Pupils are equal, round, and reactive to light.  Neck:     Trachea: Trachea and phonation normal. No abnormal tracheal secretions.  Cardiovascular:     Rate and Rhythm: Normal rate and regular rhythm.     Pulses: Normal pulses.          Radial pulses are 2+ on the right side and 2+ on the left side.     Heart sounds: Normal heart sounds, S1 normal and S2 normal.  Pulmonary:  Effort: Pulmonary effort is normal. No respiratory distress.     Breath sounds: Normal breath sounds and air entry. No stridor, decreased air movement or transmitted upper airway sounds. No decreased breath sounds, wheezing, rhonchi or rales.     Comments: Spoke full sentences without difficulty; no cough observed in exam room Abdominal:     General: Abdomen is flat.  Musculoskeletal:        General: Tenderness present. No deformity or signs of injury. Normal range of motion.     Right hand: Normal strength. Normal capillary refill.     Left hand: Normal strength. Normal capillary refill.     Cervical back:  Normal range of motion and neck supple. No swelling, edema, deformity, erythema, signs of trauma, lacerations, rigidity, spasms, tenderness or crepitus. No pain with movement or muscular tenderness. Normal range of motion.     Thoracic back: No swelling, edema, deformity, signs of trauma, lacerations, spasms or tenderness. Normal range of motion.     Right knee: No swelling, effusion, erythema or ecchymosis. Tenderness present.     Left knee: No swelling, effusion, erythema or ecchymosis. Tenderness present.     Right lower leg: No deformity. No edema.     Left lower leg: No deformity. No edema.     Right ankle: No swelling or ecchymosis.     Left ankle: No swelling or ecchymosis.       Legs:     Comments: Diameter left knee central patella/patellar fossa 19.5"; mild TTP distal gastrocnemius; left superior knee varicose veins and posterior distal hamstring spider varicose veins lateral mildly TTP; very ropy enlarged varicose vein anterior left thigh distal and knee no palpable firmness/nodules/increased temperature  Lymphadenopathy:     Head:     Right side of head: No submandibular or preauricular adenopathy.     Left side of head: No submandibular or preauricular adenopathy.     Cervical: No cervical adenopathy.     Right cervical: No superficial cervical adenopathy.    Left cervical: No superficial cervical adenopathy.  Skin:    General: Skin is warm and dry.     Capillary Refill: Capillary refill takes less than 2 seconds.     Coloration: Skin is not ashen, cyanotic, jaundiced, mottled, pale or sallow.     Findings: No abrasion, abscess, acne, bruising, burn, ecchymosis, erythema, signs of injury, laceration, lesion, petechiae, rash or wound.     Nails: There is no clubbing.  Neurological:     General: No focal deficit present.     Mental Status: She is alert and oriented to person, place, and time. Mental status is at baseline.     Cranial Nerves: No cranial nerve deficit.     Motor:  Motor function is intact. No weakness, tremor, abnormal muscle tone or seizure activity.     Coordination: Coordination is intact. Coordination normal.     Gait: Gait is intact. Gait normal.     Comments: In/out of chair without difficulty; gait sure and steady in clinic; bilateral hand grasp equal 5/5  Psychiatric:        Attention and Perception: Attention and perception normal.        Mood and Affect: Mood and affect normal.        Speech: Speech normal.        Behavior: Behavior normal. Behavior is cooperative.        Thought Content: Thought content normal.        Cognition and Memory: Cognition and memory  normal.        Judgment: Judgment normal.           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  Notify NP/PCM if red streaks legs/new leg pain/swelling/hot spot/nodule.  Exitcare handout on varicose veins and DVT.  Discussed symptoms of dvt can include leg swelling, red rash, pain, chest pain, difficulty breathing.  Discussed schedule follow up with her vascular surgery provider as she stated anterior knee varicose vein has definitely become more ropy/enlarged over the past year and bothersome.  Patient has had sclerotherapy in the past for varicose veins that worked well for her.  Name of her last provider given to patient as she was unable to remember after chart review.  Patient to notify me if referral required and I will enter for her.  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  Avoid massaging irritated veins.  May try epsom salt bath and legs up wall/elevation of legs after work.  Consider taking breaks from prolonged sitting at work every 1-2 hours to take brief walk.  Consider pedal pumps when sitting to help move circulation from lower legs back into central  system.  Continue weight loss efforts and exercise 150 minutes per week.  Consider legs up wall/elevation after work/in evening at home.  Discussed yoga method of legs up wall with patient.  Patient verbalized understanding information/instructions, agreed with plan of care and had no further questions at this time.

## 2023-07-01 NOTE — Patient Instructions (Addendum)
Varicose Veins Varicose veins are veins that have become enlarged, bulged, and twisted. They most often appear in the legs. What are the causes? This condition is caused by damage to the valves in the vein. These valves help blood return to your heart. When they are damaged and they stop working properly, blood may flow backward and back up in the veins near the skin, causing the veins to get larger and appear twisted. The condition can result from any issue that causes blood to back up, like pregnancy, prolonged standing, or obesity. What increases the risk? The following factors may make you more likely to develop this condition: Being on your feet a lot. Being pregnant. Being overweight. Smoking. Having had a previous deep vein thrombosis or having a thrombotic disorder. Aging. The risk increases with age. Having a condition called Klippel-Trenaunay syndrome. What are the signs or symptoms? Symptoms of this condition include: Bulging, twisted, and bluish veins. A feeling of heaviness in your legs. This may be worse at the end of the day. Leg pain. This may be worse at the end of the day. Swelling in the leg. Changes in skin color over the veins. Swelling or pain in the legs can limit your activities. Your symptoms may get worse when you sit or stand for long periods of time. How is this diagnosed? This condition may be diagnosed based on: Your symptoms, family history, activity levels, and lifestyle. A physical exam. You may also have tests, including an ultrasound or X-ray. How is this treated? Treatment for this condition may involve: Avoiding sitting or standing in one position for long periods of time. Wearing compression stockings. These stockings help to prevent blood clots and reduce swelling in the legs. Raising (elevating) the legs when resting. Losing weight. Exercising regularly. If you have persistent symptoms or want to improve the way your varicose veins look, you  may choose to have a procedure to close the varicose veins off or to remove them. Nonsurgical treatments to close off the veins include: Sclerotherapy. In this treatment, a solution is injected into a vein to close it off. Laser treatment. The vein is heated with a laser to close it off. Radiofrequency vein ablation. An electrical current produced by radio waves is used to close off the vein. Surgical treatments to remove the veins include: Phlebectomy. In this procedure, the veins are removed through small incisions made over the veins. Vein ligation and stripping. In this procedure, incisions are made over the veins. The veins are then removed after being tied (ligated) with stitches (sutures). Follow these instructions at home: Medicines Take over-the-counter and prescription medicines only as told by your health care provider. If you were prescribed an antibiotic medicine, use it as told by your health care provider. Do not stop using the antibiotic even if you start to feel better. Activity Walk as much as possible. Walking increases blood flow. This helps blood return to the heart and takes pressure off your veins. Do not stand or sit in one position for a long period of time. Do not sit with your legs crossed. Avoid sitting for a long time without moving. Get up to take short walks every 1-2 hours. This is important to improve blood flow and breathing. Ask for help if you feel weak or unsteady. Return to your normal activities as told by your health care provider. Ask your health care provider what activities are safe for you. Do exercises as told by your health care provider. General instructions  Follow any diet instructions given to you by your health care provider. Elevate your legs at night to above the level of your heart. If you get a cut in the skin over the varicose vein and the vein bleeds: Lie down with your leg raised. Apply firm pressure to the cut with a clean cloth  until the bleeding stops. Place a bandage (dressing) on the cut. Drink enough fluid to keep your urine pale yellow. Do not use any products that contain nicotine or tobacco. These products include cigarettes, chewing tobacco, and vaping devices, such as e-cigarettes. If you need help quitting, ask your health care provider. Wear compression stockings as told by your health care provider. Do not wear other kinds of tight clothing around your legs, pelvis, or waist. Keep all follow-up visits. This is important. Contact a health care provider if: The skin around your varicose veins starts to break down. You have more pain, redness, tenderness, or hard swelling over a vein. You are uncomfortable because of pain. You get a cut in the skin over a varicose vein and it will not stop bleeding. Get help right away if: You have chest pain. You have trouble breathing. You have severe leg pain. Summary Varicose veins are veins that have become enlarged, bulged, and twisted. They most often appear in the legs. This condition is caused by damage to the valves in the vein. These valves help blood return to your heart. Treatment for this condition includes frequent movements, wearing compression stockings, losing weight, and exercising regularly. In some cases, procedures are done to close off or remove the veins. Nonsurgical treatments to close off the veins include sclerotherapy, laser therapy, and radiofrequency vein ablation. This information is not intended to replace advice given to you by your health care provider. Make sure you discuss any questions you have with your health care provider. Document Revised: 02/05/2021 Document Reviewed: 02/05/2021 Elsevier Patient Education  2024 [employer]  Deep Vein Thrombosis  Deep vein thrombosis (DVT) is a condition in which a blood clot forms in a vein of the deep venous system. This can occur in the lower leg, thigh, pelvis, arm, or neck. A clot is blood  that has thickened into a gel or solid. This condition is serious and can be life-threatening if the clot travels to the arteries of the lungs and causes a blockage (pulmonary embolism). A DVT can also damage veins in the leg, which can lead to long-term venous disease, leg pain, swelling, discoloration, and ulcers or sores (post-thrombotic syndrome). What are the causes? This condition may be caused by: A slowdown of blood flow. Damage to a vein. A condition that causes blood to clot more easily, such as certain bleeding disorders. What increases the risk? The following factors may make you more likely to develop this condition: Obesity. Being older, especially older than age 12. Being inactive or not moving around (sedentary lifestyle). This may include: Sitting or lying down for longer than 4-6 hours other than to sleep at night. Being in the hospital, or having major or lengthy surgery. Having any recent bone injuries, such as breaks (fractures), that reduce movement, especially in the lower extremities. Having recent orthopedic surgery on the lower extremities. Being pregnant, giving birth, or having recently given birth. Taking medicines that contain estrogen, such as birth control or hormone replacement therapy. Using products that contain nicotine or tobacco, especially if you use hormonal birth control. Having a history of a blood vessel disease (peripheral vascular disease) or  congestive heart disease. Having a history of cancer, especially if being treated with chemotherapy. What are the signs or symptoms? Symptoms of this condition include: Swelling, pain, pressure, or tenderness in an arm or a leg. An arm or a leg becoming warm, red, or discolored. A leg turning very pale or blue. You may have a large DVT. This is rare. If the clot is in your leg, you may notice that symptoms get worse when you stand or walk. In some cases, there are no symptoms. How is this diagnosed? This  condition is diagnosed with: Your medical history and a physical exam. Tests, such as: Blood tests to check how well your blood clots. Doppler ultrasound. This is the best way to find a DVT. CT venogram. Contrast dye is injected into a vein, and X-rays are taken to check for clots. This is helpful for veins in the chest or pelvis. How is this treated? Treatment for this condition depends on: The cause of your DVT. The size and location of your DVT, or having more than one DVT. Your risk for bleeding or developing more clots. Other medical conditions you may have. Treatment may include: Taking a blood thinner medicine (anticoagulant) to prevent more clots from forming or current clots from growing. Wearing compression stockings. Injecting medicines into the affected vein to break up the clot (catheter-directed thrombolysis). Surgical procedures, when DVT is severe or hard to treat. These may be done to: Isolate and remove your clot. Place an inferior vena cava (IVC) filter. This filter is placed into a large vein called the inferior vena cava to catch blood clots before they reach your lungs. You may get some medical treatments for 6 months or longer. Follow these instructions at home: If you are taking blood thinners: Talk with your health care provider before you take any medicines that contain aspirin or NSAIDs, such as ibuprofen. These medicines increase your risk for dangerous bleeding. Take your medicine exactly as told, at the same time every day. Do not skip a dose. Do not take more than the prescribed dose. This is important. Ask your health care provider about foods and medicines that could change or interact with the way your blood thinner works. Avoid these foods and medicines if you are told to do so. Avoid anything that may cause bleeding or bruising. You may bleed more easily while taking blood thinners. Be very careful when using knives, scissors, or other sharp objects. Use  an electric razor instead of a blade. Avoid activities that could cause injury or bruising, and follow instructions for preventing falls. Tell your health care provider if you have had any internal bleeding, bleeding ulcers, or neurologic diseases, such as strokes or cerebral aneurysms. Wear a medical alert bracelet or carry a card that lists what medicines you take. General instructions Take over-the-counter and prescription medicines only as told by your health care provider. Return to your normal activities as told by your health care provider. Ask your health care provider what activities are safe for you. If recommended, wear compression stockings as told by your health care provider. These stockings help to prevent blood clots and reduce swelling in your legs. Never wear your compression stockings while sleeping at night. Keep all follow-up visits. This is important. Where to find more information American Heart Association: www.heart.org Centers for Disease Control and Prevention: FootballExhibition.com.br National Heart, Lung, and Blood Institute: PopSteam.is Contact a health care provider if: You miss a dose of your blood thinner. You have unusual  bruising or other color changes. You have new or worse pain, swelling, or redness in an arm or a leg. You have worsening numbness or tingling in an arm or a leg. You have a significant color change (pale or blue) in the extremity that has the DVT. Get help right away if: You have signs or symptoms that a blood clot has moved to the lungs. These may include: Shortness of breath. Chest pain. Fast or irregular heartbeats (palpitations). Light-headedness, dizziness, or fainting. Coughing up blood. You have signs or symptoms that your blood is too thin. These may include: Blood in your vomit, stool, or urine. A cut that will not stop bleeding. A menstrual period that is heavier than usual. A severe headache or confusion. These symptoms may be an  emergency. Get help right away. Call 911. Do not wait to see if the symptoms will go away. Do not drive yourself to the hospital. Summary Deep vein thrombosis (DVT) happens when a blood clot forms in a deep vein. This may occur in the lower leg, thigh, pelvis, arm, or neck. Symptoms affect the arm or leg and can include swelling, pain, tenderness, warmth, redness, or discoloration. This condition may be treated with medicines. In severe cases, a procedure or surgery may be done to remove or dissolve the clots. If you are taking blood thinners, take them exactly as told. Do not skip a dose. Do not take more than is prescribed. Get help right away if you have a severe headache, shortness of breath, chest pain, fast or irregular heartbeats, or blood in your vomit, urine, or stool. This information is not intended to replace advice given to you by your health care provider. Make sure you discuss any questions you have with your health care provider. Document Revised: 03/17/2021 Document Reviewed: 03/17/2021 Elsevier Patient Education  2024 [employer].

## 2023-07-09 ENCOUNTER — Telehealth: Payer: Self-pay | Admitting: Registered Nurse

## 2023-07-12 ENCOUNTER — Other Ambulatory Visit (HOSPITAL_COMMUNITY): Payer: Self-pay

## 2023-07-13 NOTE — Telephone Encounter (Signed)
Patient reported had some GI upset this weekend and was out of work yesterday.  Was with family members over the weekend similar symptoms.  Denied fever/chills/n/v/d today.  Discussed with patient viral gastroenteritis circulating in the community.  Bland diet if n/v and patient has previous handouts for foods to relieve diarrhea if needed/returns. Avoid dehydration/hydrate with water to keep urine pale yellow clear and voiding every 2-4 hours while awake. Patient aware to notify clinic staff if vomiting/diarrhea/fever greater than 100.5 and to stay home.  Patient agreed with plan of care and had no further questions at this time.

## 2023-07-14 ENCOUNTER — Other Ambulatory Visit (HOSPITAL_COMMUNITY): Payer: Self-pay

## 2023-07-14 ENCOUNTER — Other Ambulatory Visit (HOSPITAL_BASED_OUTPATIENT_CLINIC_OR_DEPARTMENT_OTHER): Payer: Self-pay

## 2023-07-22 ENCOUNTER — Encounter: Payer: Self-pay | Admitting: Registered Nurse

## 2023-07-22 ENCOUNTER — Telehealth: Payer: Self-pay | Admitting: Registered Nurse

## 2023-07-22 DIAGNOSIS — I10 Essential (primary) hypertension: Secondary | ICD-10-CM

## 2023-07-22 NOTE — Telephone Encounter (Signed)
Patient reported started strattera 18mg  daily this week for ADHD.  Next appt with her mental health provider in a month.  Patient asking if should monitor her blood pressure after starting this medication.  Discussed can check BPst in clinic and take measurements at home on the weekend to see what typical blood pressure is as strattera can raise blood pressure.  Patient supposed to increase dose to 2 tabs next week 36mg .  Denied headache/chest pain/dyspnea/visual changes.  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  Patient verbalized understanding information/instructions and will come to clinic for BP check and keep log at home.

## 2023-07-27 ENCOUNTER — Ambulatory Visit: Payer: No Typology Code available for payment source

## 2023-07-27 VITALS — BP 127/86 | HR 77

## 2023-07-27 DIAGNOSIS — I10 Essential (primary) hypertension: Secondary | ICD-10-CM

## 2023-08-06 ENCOUNTER — Other Ambulatory Visit (HOSPITAL_BASED_OUTPATIENT_CLINIC_OR_DEPARTMENT_OTHER): Payer: Self-pay

## 2023-08-09 ENCOUNTER — Other Ambulatory Visit (HOSPITAL_BASED_OUTPATIENT_CLINIC_OR_DEPARTMENT_OTHER): Payer: Self-pay

## 2023-08-09 MED ORDER — TIRZEPATIDE-WEIGHT MANAGEMENT 5 MG/0.5ML ~~LOC~~ SOAJ
5.0000 mg | SUBCUTANEOUS | 1 refills | Status: DC
  Filled 2023-08-09: qty 2, 28d supply, fill #0
  Filled 2023-09-07: qty 2, 28d supply, fill #1

## 2023-08-10 ENCOUNTER — Encounter: Payer: Self-pay | Admitting: Registered Nurse

## 2023-08-10 ENCOUNTER — Telehealth: Payer: Self-pay | Admitting: Registered Nurse

## 2023-08-10 DIAGNOSIS — B349 Viral infection, unspecified: Secondary | ICD-10-CM

## 2023-08-10 NOTE — Telephone Encounter (Signed)
I have recommended clear fluids and bland diet.  Avoid dairy/spicy, fried and large portions of meat while having nausea.  If vomiting hold po intake x 1 hour.  Then sips clear fluids like broths, ginger ale, power ade, gatorade, pedialyte may advance to soft/bland if no vomiting x 24 hours and appetite returned otherwise hydration main focus.  Return to the clinic if symptoms persist or worsen; I have alerted the patient to call if high fever, dehydration, marked weakness, fainting, increased abdominal pain, blood in stool or vomit (red or black).   Exitcare handout on viral gastroenteritis, sinus pain and sinus rinse and viral URI.  Patient may use normal saline nasal spray 2 sprays each nostril q2h wa as needed. Restart flonase 1 spray each nostril BID.  OTC antihistamine of choice claritin/zyrtec 10mg  po daily.  Avoid triggers if possible.  Shower prior to bedtime if exposed to triggers.  Discussed current viral illness symptoms lasting 7-14 days typically.  Dry up drip and decrease inflammation to help avoid bacterial sinusitis, bronchitis and ear infections.  Patient with nasal sniffing/congestion/pressure behind eyes.  Negative for fever.  GI upset resolving and tolerating po intake without difficulty.  Patient verbalized agreement and understanding of treatment plan and had no further questions at this time.

## 2023-09-02 NOTE — Telephone Encounter (Signed)
Patient reported all symptoms resolved.  Denied questions or concerns will be on vacation through the holidays.

## 2023-09-07 ENCOUNTER — Other Ambulatory Visit (HOSPITAL_COMMUNITY): Payer: Self-pay

## 2023-09-07 ENCOUNTER — Other Ambulatory Visit (HOSPITAL_BASED_OUTPATIENT_CLINIC_OR_DEPARTMENT_OTHER): Payer: Self-pay

## 2023-09-09 ENCOUNTER — Ambulatory Visit: Payer: No Typology Code available for payment source | Admitting: Registered Nurse

## 2023-09-09 ENCOUNTER — Encounter: Payer: Self-pay | Admitting: Registered Nurse

## 2023-09-09 VITALS — HR 76 | Temp 98.2°F | Resp 16

## 2023-09-09 DIAGNOSIS — J069 Acute upper respiratory infection, unspecified: Secondary | ICD-10-CM

## 2023-09-09 NOTE — Patient Instructions (Signed)
 Viral Gastroenteritis, Adult  Viral gastroenteritis is also known as the stomach flu. This condition may affect your stomach, small intestine, and large intestine. It can cause sudden watery diarrhea, fever, and vomiting. This condition is caused by many different viruses. These viruses can be passed from person to person very easily (are contagious). Diarrhea and vomiting can make you feel weak and cause you to become dehydrated. You may not be able to keep fluids down. Dehydration can make you tired and thirsty, cause you to have a dry mouth, and decrease how often you urinate. It is important to replace the fluids that you lose from diarrhea and vomiting. What are the causes? Gastroenteritis is caused by many viruses, including rotavirus and norovirus. Norovirus is the most common cause in adults. You can get sick after being exposed to the viruses from other people. You can also get sick by: Eating food, drinking water, or touching a surface contaminated with one of these viruses. Sharing utensils or other personal items with an infected person. What increases the risk? You are more likely to develop this condition if you: Have a weak body defense system (immune system). Live with one or more children who are younger than 2 years. Live in a nursing home. Travel on cruise ships. What are the signs or symptoms? Symptoms of this condition start suddenly 1-3 days after exposure to a virus. Symptoms may last for a few days or for as long as a week. Common symptoms include watery diarrhea and vomiting. Other symptoms include: Fever. Headache. Fatigue. Pain in the abdomen. Chills. Weakness. Nausea. Muscle aches. Loss of appetite. How is this diagnosed? This condition is diagnosed with a medical history and physical exam. You may also have a stool test to check for viruses or other infections. How is this treated? This condition typically goes away on its own. The focus of treatment is to  prevent dehydration and restore lost fluids (rehydration). This condition may be treated with: An oral rehydration solution (ORS) to replace important salts and minerals (electrolytes) in your body. Take this if told by your health care provider. This is a drink that is sold at pharmacies and retail stores. Medicines to help with your symptoms. Probiotic supplements to reduce symptoms of diarrhea. Fluids given through an IV, if dehydration is severe. Older adults and people with other diseases or a weak immune system are at higher risk for dehydration. Follow these instructions at home: Eating and drinking  Take an ORS as told by your health care provider. Drink clear fluids in small amounts as you are able. Clear fluids include: Water. Ice chips. Diluted fruit juice. Low-calorie sports drinks. Drink enough fluid to keep your urine pale yellow. Eat small amounts of healthy foods every 3-4 hours as you are able. This may include whole grains, fruits, vegetables, lean meats, and yogurt. Avoid fluids that contain a lot of sugar or caffeine, such as energy drinks, sports drinks, and soda. Avoid spicy or fatty foods. Avoid alcohol. General instructions  Wash your hands often, especially after having diarrhea or vomiting. If soap and water are not available, use hand sanitizer. Make sure that all people in your household wash their hands well and often. Take over-the-counter and prescription medicines only as told by your health care provider. Rest at home while you recover. Watch your condition for any changes. Take a warm bath to relieve any burning or pain from frequent diarrhea episodes. Keep all follow-up visits. This is important. Contact a health care  provider if you: Cannot keep fluids down. Have symptoms that get worse. Have new symptoms. Feel light-headed or dizzy. Have muscle cramps. Get help right away if you: Have chest pain. Have trouble breathing or you are breathing  very quickly. Have a fast heartbeat. Feel extremely weak or you faint. Have a severe headache, a stiff neck, or both. Have a rash. Have severe pain, cramping, or bloating in your abdomen. Have skin that feels cold and clammy. Feel confused. Have pain when you urinate. Have signs of dehydration, such as: Dark urine, very little urine, or no urine. Cracked lips. Dry mouth. Sunken eyes. Sleepiness. Weakness. Have signs of bleeding, such as: Seeing blood in your vomit. Having vomit that looks like coffee grounds. Having bloody or black stools or stools that look like tar. These symptoms may be an emergency. Get help right away. Call 911. Do not wait to see if the symptoms will go away. Do not drive yourself to the hospital. Summary Viral gastroenteritis is also known as the stomach flu. It can cause sudden watery diarrhea, fever, and vomiting. This condition can be passed from person to person very easily (is contagious). Take an oral rehydration solution (ORS) if told by your health care provider. This is a drink that is sold at pharmacies and retail stores. Wash your hands often, especially after having diarrhea or vomiting. If soap and water are not available, use hand sanitizer. This information is not intended to replace advice given to you by your health care provider. Make sure you discuss any questions you have with your health care provider. Document Revised: 06/23/2021 Document Reviewed: 06/23/2021 Elsevier Patient Education  2024 [employer] Viral Respiratory Infection A respiratory infection is an illness that affects part of the respiratory system, such as the lungs, nose, or throat. A respiratory infection that is caused by a virus is called a viral respiratory infection. Common types of viral respiratory infections include: A cold. The flu (influenza). A respiratory syncytial virus (RSV) infection. What are the causes? This condition is caused by a virus. The virus  may spread through contact with droplets or direct contact with infected people or their mucus or secretions. The virus may spread from person to person (is contagious). What are the signs or symptoms? Symptoms of this condition include: A stuffy or runny nose. A sore throat or cough. Shortness of breath or difficulty breathing. Yellow or green mucus (sputum). Other symptoms may include: A fever. Sweating or chills. Fatigue. Achy muscles. A headache. How is this diagnosed? This condition may be diagnosed based on: Your symptoms. A physical exam. Testing of secretions from the nose or throat. Chest X-ray. How is this treated? This condition may be treated with medicines, such as: Antiviral medicine. This may shorten the length of time a person has symptoms. Expectorants. These make it easier to cough up mucus. Decongestant nasal sprays. Acetaminophen or NSAIDs, such as ibuprofen, to relieve fever and pain. Antibiotic medicines are not prescribed for viral infections.This is because antibiotics are designed to kill bacteria. They do not kill viruses. Follow these instructions at home: Managing pain and congestion Take over-the-counter and prescription medicines only as told by your health care provider. If you have a sore throat, gargle with a mixture of salt and water 3-4 times a day or as needed. To make salt water, completely dissolve -1 tsp (3-6 g) of salt in 1 cup (237 mL) of warm water. Use nose drops made from salt water to ease congestion and soften  raw skin around your nose. Take 2 tsp (10 mL) of honey at bedtime to lessen coughing at night. Do not give honey to children who are younger than 1 year. Drink enough fluid to keep your urine pale yellow. This helps prevent dehydration and helps loosen up mucus. General instructions  Rest as much as possible. Do not drink alcohol. Do not use any products that contain nicotine or tobacco. These products include cigarettes,  chewing tobacco, and vaping devices, such as e-cigarettes. If you need help quitting, ask your health care provider. Keep all follow-up visits. This is important. How is this prevented?     Get an annual flu shot. You may get the flu shot in late summer, fall, or winter. Ask your health care provider when you should get your flu shot. Avoid spreading your infection to other people. If you are sick: Wash your hands with soap and water often, especially after you cough or sneeze. Wash for at least 20 seconds. If soap and water are not available, use alcohol-based hand sanitizer. Cover your mouth when you cough. Cover your nose and mouth when you sneeze. Do not share cups or eating utensils. Clean commonly used objects often. Clean commonly touched surfaces. Stay home from work or school as told by your health care provider. Avoid contact with people who are sick during cold and flu season. This is generally fall and winter. Contact a health care provider if: Your symptoms last for 10 days or longer. Your symptoms get worse over time. You have severe sinus pain in your face or forehead. The glands in your jaw or neck become very swollen. You have shortness of breath. Get help right away if you: Feel pain or pressure in your chest. Have trouble breathing. Faint or feel like you will faint. Have severe and persistent vomiting. Feel confused or disoriented. These symptoms may represent a serious problem that is an emergency. Do not wait to see if the symptoms will go away. Get medical help right away. Call your local emergency services (911 in the [place] Do not drive yourself to the hospital. Summary A respiratory infection is an illness that affects part of the respiratory system, such as the lungs, nose, or throat. A respiratory infection that is caused by a virus is called a viral respiratory infection. Common types of viral respiratory infections include a cold, influenza, and respiratory  syncytial virus (RSV) infection. Symptoms of this condition include a stuffy or runny nose, cough, fatigue, achy muscles, sore throat, and fevers or chills. Antibiotic medicines are not prescribed for viral infections. This is because antibiotics are designed to kill bacteria. They are not effective against viruses. This information is not intended to replace advice given to you by your health care provider. Make sure you discuss any questions you have with your health care provider. Document Revised: 11/28/2020 Document Reviewed: 11/28/2020 Elsevier Patient Education  2024 [employer].

## 2023-09-09 NOTE — Progress Notes (Signed)
 Subjective:    Patient ID: Olivia Hamilton, female    DOB: April 22, 1966, 58 y.o.   MRN: [id number]  57y/o single caucasian female established patient has had a couple days of congestion, brain fog and upset stomach.  Diarrhea yesterday.  None today.  Started between Christmas and New Years was on vacation to spend time with extended family and other family members sick.  Has not home covid tested.  Has had some hot/cold feelings but has not checked her temperature at home.  Tolerating po intake without difficulty today.      Review of Systems  Constitutional:  Positive for diaphoresis and fatigue. Negative for chills.  HENT:  Positive for congestion and rhinorrhea. Negative for trouble swallowing and voice change.   Eyes:  Negative for photophobia and visual disturbance.  Respiratory:  Negative for shortness of breath, wheezing and stridor.   Cardiovascular:  Negative for chest pain and palpitations.  Gastrointestinal:  Positive for diarrhea and nausea. Negative for abdominal distention and vomiting.  Genitourinary:  Negative for difficulty urinating.  Musculoskeletal:  Positive for myalgias. Negative for gait problem, neck pain and neck stiffness.  Skin:  Negative for rash.  Neurological:  Negative for dizziness, tremors, seizures, syncope, facial asymmetry, speech difficulty, weakness, light-headedness and numbness.  Hematological:  Negative for adenopathy. Does not bruise/bleed easily.  Psychiatric/Behavioral:  Negative for agitation, confusion and sleep disturbance.        Objective:   Physical Exam Vitals and nursing note reviewed.  Constitutional:      General: She is awake. She is not in acute distress.    Appearance: Normal appearance. She is well-developed and well-groomed. She is obese. She is not ill-appearing, toxic-appearing or diaphoretic.  HENT:     Head: Normocephalic and atraumatic.     Jaw: There is normal jaw occlusion.     Salivary Glands: Right salivary gland  is not diffusely enlarged. Left salivary gland is not diffusely enlarged.     Right Ear: Hearing and external ear normal.     Left Ear: Hearing and external ear normal.     Nose: Nose normal. No congestion or rhinorrhea.     Right Nostril: No epistaxis.     Left Nostril: No epistaxis.     Mouth/Throat:     Lips: Pink. No lesions.     Mouth: Mucous membranes are moist. No oral lesions or angioedema.     Dentition: No gum lesions.     Tongue: No lesions. Tongue does not deviate from midline.     Palate: No mass and lesions.     Pharynx: Oropharynx is clear. Uvula midline.  Eyes:     General: Lids are normal. Vision grossly intact. Gaze aligned appropriately. Allergic shiner present. No scleral icterus.       Right eye: No discharge.        Left eye: No discharge.     Extraocular Movements: Extraocular movements intact.     Conjunctiva/sclera: Conjunctivae normal.     Pupils: Pupils are equal, round, and reactive to light.  Neck:     Trachea: Trachea and phonation normal.  Cardiovascular:     Rate and Rhythm: Normal rate and regular rhythm.     Pulses: Normal pulses.          Radial pulses are 2+ on the right side and 2+ on the left side.  Pulmonary:     Effort: Pulmonary effort is normal. No respiratory distress.     Breath sounds: Normal breath  sounds and air entry. No stridor or transmitted upper airway sounds. No decreased breath sounds, wheezing, rhonchi or rales.     Comments: Spoke full sentences without difficulty; no cough observed  Abdominal:     General: Abdomen is flat. There is no distension.     Palpations: Abdomen is soft.  Musculoskeletal:        General: Normal range of motion.     Right hand: Normal strength. Normal capillary refill.     Left hand: Normal strength. Normal capillary refill.     Cervical back: Normal range of motion and neck supple. No swelling, edema, deformity, erythema, signs of trauma, lacerations, rigidity, spasms, torticollis, tenderness or  crepitus. No pain with movement. Normal range of motion.     Thoracic back: No swelling, edema, deformity, signs of trauma, lacerations, spasms or tenderness. Normal range of motion.     Right lower leg: No edema.     Left lower leg: No edema.  Lymphadenopathy:     Head:     Right side of head: No submandibular or preauricular adenopathy.     Left side of head: No submandibular or preauricular adenopathy.     Cervical: No cervical adenopathy.     Right cervical: No superficial cervical adenopathy.    Left cervical: No superficial cervical adenopathy.  Skin:    General: Skin is warm and dry.     Capillary Refill: Capillary refill takes less than 2 seconds.     Coloration: Skin is not ashen, cyanotic, jaundiced, mottled, pale or sallow.     Findings: No abrasion, abscess, acne, bruising, burn, ecchymosis, erythema, signs of injury, laceration, lesion, petechiae, rash or wound.     Nails: There is no clubbing.     Comments: Face/neck/hands visually inspected  Neurological:     General: No focal deficit present.     Mental Status: She is alert and oriented to person, place, and time. Mental status is at baseline.     GCS: GCS eye subscore is 4. GCS verbal subscore is 5. GCS motor subscore is 6.     Cranial Nerves: Cranial nerves 2-12 are intact. No cranial nerve deficit, dysarthria or facial asymmetry.     Sensory: Sensation is intact.     Motor: Motor function is intact. No weakness, tremor, atrophy, abnormal muscle tone or seizure activity.     Coordination: Coordination is intact. Coordination normal.     Gait: Gait is intact. Gait normal.     Comments: In/out of chair without difficulty; gait sure and steady in office; bilateral hand grasp equal 5/5  Psychiatric:        Attention and Perception: Attention and perception normal.        Mood and Affect: Mood and affect normal.        Speech: Speech normal.        Behavior: Behavior normal. Behavior is cooperative.        Thought  Content: Thought content normal.        Cognition and Memory: Cognition and memory normal.        Judgment: Judgment normal.     Home covid test results negative      Assessment & Plan:   A-viral URI   P-Given 1 free [place]  govt home covid test to complete.  Honey 1 tablespoon po q4h prn cough.  Start zyrtec or claritin 10mg  po daily prn rhinitis.  Discussed flu typically body aches/fever which she does not have not testing for today. OTC  nyquil/robitussin/delsym per manufacturer instructions   Discussed need to dry up post nasal drip and will help with cough/sore throat also.  Discussed many viruses circulating in community.  Discussed hydrate with water to keep urine pale yellow clear and voiding every 2-4 hours while awake.  Patient may use normal saline nasal spray 2 sprays each nostril q2h wa as needed. flonase  50mcg 1 spray each nostril BID OTC.  OTC antihistamine of choice claritin/zyrtec 10mg  po daily.  May use OTC robitussin, delsym, nyquil per manufacturer instructions for cough if honey not helping enough.  Discussed post nasal drip triggering cough need to get it dried up.   Avoid triggers if possible.  Shower prn congestion nasal  Exitcare handouts viral URI. Discussed flu and norovirus causing GI upset in local area/[place]  also.   I have recommended clear fluids and bland diet.  Avoid dairy/spicy, fried and large portions of meat while having nausea.  If vomiting hold po intake x 1 hour.  Then sips clear fluids like broths, ginger ale, power ade, gatorade, pedialyte may advance to soft/bland if no vomiting x 24 hours and appetite returned otherwise hydration main focus.  zofran  4mg  po BID prn n/v Return to the clinic if symptoms persist or worsen; I have alerted the patient to call if high fever, dehydration, marked weakness, fainting, increased abdominal pain, blood in stool or vomit (red or black).  Avoid high sugar drinks/foods if having diarrhea as can worsen diarrhea.  Patient verbalized  understanding of instructions, agreed with plan of care and had no further questions at this time.  P2:  Avoidance and hand washing.

## 2023-09-13 ENCOUNTER — Other Ambulatory Visit: Payer: Self-pay | Admitting: Registered Nurse

## 2023-09-13 ENCOUNTER — Ambulatory Visit: Payer: No Typology Code available for payment source

## 2023-09-13 DIAGNOSIS — E538 Deficiency of other specified B group vitamins: Secondary | ICD-10-CM

## 2023-09-13 MED ORDER — CYANOCOBALAMIN 1000 MCG/ML IJ SOLN
1000.0000 ug | INTRAMUSCULAR | Status: AC
  Administered 2023-09-13: 1000 ug via INTRAMUSCULAR

## 2023-09-21 ENCOUNTER — Other Ambulatory Visit: Payer: Self-pay | Admitting: Obstetrics and Gynecology

## 2023-09-21 DIAGNOSIS — Z1231 Encounter for screening mammogram for malignant neoplasm of breast: Secondary | ICD-10-CM

## 2023-10-04 ENCOUNTER — Other Ambulatory Visit (HOSPITAL_BASED_OUTPATIENT_CLINIC_OR_DEPARTMENT_OTHER): Payer: Self-pay

## 2023-10-04 MED ORDER — TIRZEPATIDE-WEIGHT MANAGEMENT 5 MG/0.5ML ~~LOC~~ SOAJ
5.0000 mg | SUBCUTANEOUS | 1 refills | Status: DC
  Filled 2023-10-04: qty 2, 28d supply, fill #0
  Filled 2023-10-31: qty 2, 28d supply, fill #1

## 2023-10-05 ENCOUNTER — Ambulatory Visit
Admission: RE | Admit: 2023-10-05 | Discharge: 2023-10-05 | Disposition: A | Discharge status: Home / Self Care | Payer: No Typology Code available for payment source | Source: Ambulatory Visit | Attending: Obstetrics and Gynecology | Admitting: Obstetrics and Gynecology

## 2023-10-05 DIAGNOSIS — Z1231 Encounter for screening mammogram for malignant neoplasm of breast: Secondary | ICD-10-CM

## 2023-10-07 ENCOUNTER — Telehealth: Payer: Self-pay | Admitting: Registered Nurse

## 2023-10-07 ENCOUNTER — Encounter: Payer: Self-pay | Admitting: Registered Nurse

## 2023-10-07 DIAGNOSIS — W009XXA Unspecified fall due to ice and snow, initial encounter: Secondary | ICD-10-CM

## 2023-10-07 DIAGNOSIS — S80212A Abrasion, left knee, initial encounter: Secondary | ICD-10-CM

## 2023-10-07 NOTE — Telephone Encounter (Signed)
Patient stated slipped and fell on ice in her driveway this weekend.  Abrasion left knee.  Has been applying neosporin and wondering if she should continue.  Discussed if scabbed may continue vaseline or neosporin topical daily until healed to help decrease scarring.  Wash area daily with soap and water then apply vaseline or neosporin ointment.  Exitcare handout on abrasion.  Patient to follow up with clinic staff if increasing erythema, discharge or new/worsening pain/swelling left knee.  Patient verbalized understanding information/instructions, agreed with plan of care and had no further questions at this time.

## 2023-10-12 ENCOUNTER — Encounter: Payer: Self-pay | Admitting: Internal Medicine

## 2023-10-31 ENCOUNTER — Other Ambulatory Visit: Payer: Self-pay | Admitting: Internal Medicine

## 2023-11-02 ENCOUNTER — Other Ambulatory Visit (HOSPITAL_BASED_OUTPATIENT_CLINIC_OR_DEPARTMENT_OTHER): Payer: Self-pay

## 2023-11-02 ENCOUNTER — Other Ambulatory Visit (HOSPITAL_COMMUNITY): Payer: Self-pay

## 2023-11-02 MED ORDER — CYANOCOBALAMIN 1000 MCG/ML IJ SOLN
1000.0000 ug | INTRAMUSCULAR | 0 refills | Status: AC
Start: 2023-11-02 — End: ?
  Filled 2023-11-02: qty 5, 35d supply, fill #0

## 2023-11-11 ENCOUNTER — Ambulatory Visit

## 2023-11-11 ENCOUNTER — Encounter: Payer: Self-pay | Admitting: Registered Nurse

## 2023-11-11 ENCOUNTER — Ambulatory Visit: Admitting: Registered Nurse

## 2023-11-11 VITALS — BP 150/92 | HR 63 | Resp 16

## 2023-11-11 VITALS — BP 140/90

## 2023-11-11 DIAGNOSIS — I10 Essential (primary) hypertension: Secondary | ICD-10-CM

## 2023-11-11 DIAGNOSIS — T148XXA Other injury of unspecified body region, initial encounter: Secondary | ICD-10-CM

## 2023-11-11 DIAGNOSIS — E538 Deficiency of other specified B group vitamins: Secondary | ICD-10-CM

## 2023-11-11 NOTE — Patient Instructions (Addendum)
 Diaphragmatic spasm (from [email or web address])  A diaphragm spasm is a sudden, involuntary contraction that can cause pain and tightness in the chest or upper abdominal area. It affects the ways the lungs expand and contract when breathing. It can occur due to stress, injury, exercise, and other causes.  The diaphragm is a muscle that acts as a partition between the upper abdomen and the chest. It plays a crucial roleTrusted Source in the respiratory system by helping a person breathe.  The diaphragm contracts when a person breathes in, allowing the rib cage to expand so that oxygen can flow into the lungs. When they breathe out, it relaxes again to help push carbon dioxide out of the lungs.  Symptoms The way a person experiences a diaphragm spasm can vary widely.  They may experience  chest pain or tightness difficulty breathing abdominal pain heart palpitations Depending on the cause of the diaphragm spasm, other symptoms may accompany it. These can include:  hiccups back pain indigestion nausea vomiting difficulty swallowing diaphragm paralysis persistent coughing These symptoms can range from mild to severe, depending on the underlying cause. How to stop diaphragm spasms Treatments for diaphragm spasms vary according to the underlying cause.  Although diaphragm spasms resulting from a sudden blow may cause discomfort, symptoms typically subside within a few minutes, making treatment unnecessary. However, it is essential to rest and concentrate on maintaining a regular breathing pattern while symptoms persist.  Stopping spasms that occur due to the following causes will require different methods:  Exercise Most diaphragm spasms that result from exercise also go away without treatment. If the spasms are persistent, anecdotal evidence suggests it may help to stretch or put pressure on the surrounding muscles.  For example, gently pushing into the  affected muscle using the fingers can help relieve discomfort. Holding one hand over the head can also help, as holding this position stretches the chest muscles.  What are the risk factors for diaphragm spasms? Doctors do not know exactly why these spasms happen. However, some people notice them after exercise, when they eat certain foods, or with stress. They may also be a symptom of a hiatal hernia.  How can I stop diaphragmatic spasms? It depends on when the spasms happen. If a person experiences them after exercise, doing warmup stretches may help. If spasms occur after eating spicy food or a large meal, dietary changes may prevent them.  Breathing exercises might also help calm diaphragm muscle spasms. A doctor may recommend antiseizure or other medications if the spasms are bothersome.  How long does a diaphragm spasm last? Most diaphragm spasms are temporary and go away within a few minutes.  If diaphragm spasms are more persistent over a longer period, this may be due to an underlying health condition. In this case, it is best for a person to consult a health professional. Muscle Strain A muscle strain is an injury that occurs when a muscle is stretched beyond its normal length. Usually, a small number of muscle fibers are torn when this happens. There are three types of muscle strains. First-degree strains have the least amount of muscle fiber tearing and the least amount of pain. Second-degree and third-degree strains have more tearing and pain. Usually, recovery from muscle strain takes 1-2 weeks. Complete healing normally takes 5-6 weeks. What are the causes? This condition is caused when a sudden, violent force is placed on a muscle and stretches it too far. This may occur with a fall, while lifting, or during sports. What  increases the risk? This condition is more likely to develop in athletes and people who are physically active. What are the signs or symptoms? Symptoms of this  condition include: Pain. Tenderness. Bruising. Swelling. Trouble using the muscle. How is this diagnosed? This condition is diagnosed based on a physical exam and your medical history. Tests may also be done, including an X-ray, ultrasound, or MRI. How is this treated? This condition is initially treated with PRICE therapy. This therapy involves: Protecting the muscle from being injured again. Resting the injured muscle. Icing the injured muscle. Applying pressure (compression) to the injured muscle. This may be done with a splint or elastic bandage. Raising (elevating) the injured muscle. Your health care provider may also recommend medicine for pain. Follow these instructions at home: If you have a removable splint: Wear the splint as told by your health care provider. Remove it only as told by your health care provider. Check the skin around the splint every day. Tell your health care provider about any concerns. Loosen the splint if your fingers or toes tingle, become numb, or turn cold and blue. Keep the splint clean. If the splint is not waterproof: Do not let it get wet. Cover it with a watertight covering when you take a bath or a shower. Managing pain, stiffness, and swelling  If directed, put ice on the injured area. To do this: If you have a removable splint, remove it as told by your health care provider. Put ice in a plastic bag. Place a towel between your skin and the bag. Leave the ice on for 20 minutes, 2-3 times a day. Remove the ice if your skin turns bright red. This is very important. If you cannot feel pain, heat, or cold, you have a greater risk of damage to the area. Move your fingers or toes often to reduce stiffness and swelling. Raise (elevate) the injured area above the level of your heart while you are sitting or lying down. Wear an elastic bandage as told by your health care provider. Make sure that it is not too tight. General instructions Take  over-the-counter and prescription medicines only as told by your health care provider. Treatment may include muscle relaxants or medicines for pain and inflammation that are taken by mouth or applied to the skin. Restrict your activity and rest the injured muscle as told by your health care provider. Gentle movements may be allowed. If physical therapy was prescribed, do exercises as told by your health care provider. Do not put pressure on any part of the splint until it is fully hardened. This may take several hours. Do not use any products that contain nicotine or tobacco. These products include cigarettes, chewing tobacco, and vaping devices, such as e-cigarettes. If you need help quitting, ask your health care provider. Ask your health care provider when it is safe to drive if you have a splint. Keep all follow-up visits. This is important. How is this prevented? Warm up before exercising. This helps to prevent future muscle strains. Contact a health care provider if: You have more pain or swelling in the injured area. Get help right away if: You have numbness or tingling in the injured area. You lose a lot of strength in the injured area. Summary A muscle strain is an injury that occurs when a muscle is stretched beyond its normal length. This condition is caused when a sudden, violent force is placed on a muscle and stretches it too far. This condition is initially  treated with PRICE therapy, which involves protecting, resting, icing, compressing, and elevating. Gentle movements may be allowed. If physical therapy was prescribed, do exercises as told by your health care provider. This information is not intended to replace advice given to you by your health care provider. Make sure you discuss any questions you have with your health care provider. Document Revised: 12/28/2022 Document Reviewed: 11/11/2020 Elsevier Patient Education  2024 [employer]  Managing Your  Hypertension Hypertension, also called high blood pressure, is when the force of the blood pressing against the walls of the arteries is too strong. Arteries are blood vessels that carry blood from your heart throughout your body. Hypertension forces the heart to work harder to pump blood and may cause the arteries to become narrow or stiff. Understanding blood pressure readings A blood pressure reading includes a higher number over a lower number: The first, or top, number is called the systolic pressure. It is a measure of the pressure in your arteries as your heart beats. The second, or bottom number, is called the diastolic pressure. It is a measure of the pressure in your arteries as the heart relaxes. For most people, a normal blood pressure is below 120/80. Your personal target blood pressure may vary depending on your medical conditions, your age, and other factors. Blood pressure is classified into four stages. Based on your blood pressure reading, your health care provider may use the following stages to determine what type of treatment you need, if any. Systolic pressure and diastolic pressure are measured in a unit called millimeters of mercury (mmHg). Normal Systolic pressure: below 120. Diastolic pressure: below 80. Elevated Systolic pressure: 120-129. Diastolic pressure: below 80. Hypertension stage 1 Systolic pressure: 130-139. Diastolic pressure: 80-89. Hypertension stage 2 Systolic pressure: 140 or above. Diastolic pressure: 90 or above. How can this condition affect me? Managing your hypertension is very important. Over time, hypertension can damage the arteries and decrease blood flow to parts of the body, including the brain, heart, and kidneys. Having untreated or uncontrolled hypertension can lead to: A heart attack. A stroke. A weakened blood vessel (aneurysm). Heart failure. Kidney damage. Eye damage. Memory and concentration problems. Vascular dementia. What  actions can I take to manage this condition? Hypertension can be managed by making lifestyle changes and possibly by taking medicines. Your health care provider will help you make a plan to bring your blood pressure within a normal range. You may be referred for counseling on a healthy diet and physical activity. Nutrition  Eat a diet that is high in fiber and potassium, and low in salt (sodium), added sugar, and fat. An example eating plan is called the DASH diet. DASH stands for Dietary Approaches to Stop Hypertension. To eat this way: Eat plenty of fresh fruits and vegetables. Try to fill one-half of your plate at each meal with fruits and vegetables. Eat whole grains, such as whole-wheat pasta, brown rice, or whole-grain bread. Fill about one-fourth of your plate with whole grains. Eat low-fat dairy products. Avoid fatty cuts of meat, processed or cured meats, and poultry with skin. Fill about one-fourth of your plate with lean proteins such as fish, chicken without skin, beans, eggs, and tofu. Avoid pre-made and processed foods. These tend to be higher in sodium, added sugar, and fat. Reduce your daily sodium intake. Many people with hypertension should eat less than 1,500 mg of sodium a day. Lifestyle  Work with your health care provider to maintain a healthy body weight or  to lose weight. Ask what an ideal weight is for you. Get at least 30 minutes of exercise that causes your heart to beat faster (aerobic exercise) most days of the week. Activities may include walking, swimming, or biking. Include exercise to strengthen your muscles (resistance exercise), such as weight lifting, as part of your weekly exercise routine. Try to do these types of exercises for 30 minutes at least 3 days a week. Do not use any products that contain nicotine or tobacco. These products include cigarettes, chewing tobacco, and vaping devices, such as e-cigarettes. If you need help quitting, ask your health care  provider. Control any long-term (chronic) conditions you have, such as high cholesterol or diabetes. Identify your sources of stress and find ways to manage stress. This may include meditation, deep breathing, or making time for fun activities. Alcohol use Do not drink alcohol if: Your health care provider tells you not to drink. You are pregnant, may be pregnant, or are planning to become pregnant. If you drink alcohol: Limit how much you have to: 0-1 drink a day for women. 0-2 drinks a day for men. Know how much alcohol is in your drink. In the [place] one drink equals one 12 oz bottle of beer (355 mL), one 5 oz glass of wine (148 mL), or one 1 oz glass of hard liquor (44 mL). Medicines Your health care provider may prescribe medicine if lifestyle changes are not enough to get your blood pressure under control and if: Your systolic blood pressure is 130 or higher. Your diastolic blood pressure is 80 or higher. Take medicines only as told by your health care provider. Follow the directions carefully. Blood pressure medicines must be taken as told by your health care provider. The medicine does not work as well when you skip doses. Skipping doses also puts you at risk for problems. Monitoring Before you monitor your blood pressure: Do not smoke, drink caffeinated beverages, or exercise within 30 minutes before taking a measurement. Use the bathroom and empty your bladder (urinate). Sit quietly for at least 5 minutes before taking measurements. Monitor your blood pressure at home as told by your health care provider. To do this: Sit with your back straight and supported. Place your feet flat on the floor. Do not cross your legs. Support your arm on a flat surface, such as a table. Make sure your upper arm is at heart level. Each time you measure, take two or three readings one minute apart and record the results. You may also need to have your blood pressure checked regularly by your health  care provider. General information Talk with your health care provider about your diet, exercise habits, and other lifestyle factors that may be contributing to hypertension. Review all the medicines you take with your health care provider because there may be side effects or interactions. Keep all follow-up visits. Your health care provider can help you create and adjust your plan for managing your high blood pressure. Where to find more information National Heart, Lung, and Blood Institute: [employer] American Heart Association: www.heart.org Contact a health care provider if: You think you are having a reaction to medicines you have taken. You have repeated (recurrent) headaches. You feel dizzy. You have swelling in your ankles. You have trouble with your vision. Get help right away if: You develop a severe headache or confusion. You have unusual weakness or numbness, or you feel faint. You have severe pain in your chest or abdomen. You vomit repeatedly. You  have trouble breathing. These symptoms may be an emergency. Get help right away. Call 911. Do not wait to see if the symptoms will go away. Do not drive yourself to the hospital. Summary Hypertension is when the force of blood pumping through your arteries is too strong. If this condition is not controlled, it may put you at risk for serious complications. Your personal target blood pressure may vary depending on your medical conditions, your age, and other factors. For most people, a normal blood pressure is less than 120/80. Hypertension is managed by lifestyle changes, medicines, or both. Lifestyle changes to help manage hypertension include losing weight, eating a healthy, low-sodium diet, exercising more, stopping smoking, and limiting alcohol. This information is not intended to replace advice given to you by your health care provider. Make sure you discuss any questions you have with your health care  provider. Document Revised: 05/08/2021 Document Reviewed: 05/08/2021 Elsevier Patient Education  2024 [employer].

## 2023-11-11 NOTE — Progress Notes (Addendum)
 Subjective:    Patient ID: Olivia Hamilton, female    DOB: 11/17/1965, 58 y.o.   MRN: [id number]  57y/o caucasian female here for evaluation left sided intermittent pain with lifting weights/new exercise program.  Currently no pain at rest or with movement but at times sharp pain occurs for short durations and then resolves.   Denied known trauma or injury.  Sometimes occurs when walking.  Patient walking and lifting weights to help her lose weight.  Weights were a new addition to exercise program      Review of Systems  Constitutional:  Negative for chills and fever.  HENT:  Negative for trouble swallowing and voice change.   Eyes:  Negative for photophobia and visual disturbance.  Respiratory:  Negative for cough, shortness of breath, wheezing and stridor.   Cardiovascular:  Negative for chest pain and palpitations.  Gastrointestinal:  Positive for abdominal pain. Negative for abdominal distention, anal bleeding, blood in stool, diarrhea, nausea and vomiting.  Genitourinary:  Negative for difficulty urinating.  Musculoskeletal:  Positive for myalgias. Negative for gait problem, neck pain and neck stiffness.  Skin:  Negative for color change, rash and wound.  Neurological:  Negative for dizziness, tremors, syncope, facial asymmetry, weakness, light-headedness, numbness and headaches.  Hematological:  Negative for adenopathy. Does not bruise/bleed easily.  Psychiatric/Behavioral:  Negative for agitation, confusion and sleep disturbance.        Objective:   Physical Exam Vitals and nursing note reviewed.  Constitutional:      General: She is awake. She is not in acute distress.    Appearance: Normal appearance. She is well-developed, well-groomed and overweight. She is not ill-appearing, toxic-appearing or diaphoretic.  HENT:     Head: Normocephalic and atraumatic.     Jaw: There is normal jaw occlusion.     Salivary Glands: Right salivary gland is not diffusely enlarged. Left  salivary gland is not diffusely enlarged.     Right Ear: Hearing and external ear normal.     Left Ear: Hearing and external ear normal.     Nose: Nose normal. No congestion or rhinorrhea.     Mouth/Throat:     Lips: Pink. No lesions.     Mouth: Mucous membranes are moist. No injury, oral lesions or angioedema.     Dentition: No gum lesions.     Pharynx: Oropharynx is clear.  Eyes:     General: Lids are normal. Vision grossly intact. Gaze aligned appropriately. Allergic shiner present. No scleral icterus.       Right eye: No discharge.        Left eye: No discharge.     Extraocular Movements: Extraocular movements intact.     Conjunctiva/sclera: Conjunctivae normal.     Pupils: Pupils are equal, round, and reactive to light.  Neck:     Trachea: Trachea and phonation normal.  Cardiovascular:     Rate and Rhythm: Normal rate and regular rhythm.     Pulses: Normal pulses.          Radial pulses are 2+ on the right side and 2+ on the left side.  Pulmonary:     Effort: Pulmonary effort is normal.     Breath sounds: Normal breath sounds and air entry. No stridor or transmitted upper airway sounds. No wheezing.     Comments: Spoke full sentences without difficulty; no cough observed in exam room Abdominal:     General: Abdomen is flat. There is no distension or abdominal bruit.  Palpations: Abdomen is soft. There is no mass.     Tenderness: There is no abdominal tenderness. There is no left CVA tenderness, guarding or rebound. Negative signs include Murphy's sign.     Hernia: No hernia is present.       Comments: Area where pain located when occurs currently pain free with and without palpation  Musculoskeletal:        General: No swelling, tenderness or deformity. Normal range of motion.     Right hand: Normal strength. Normal capillary refill.     Left hand: Normal strength. Normal capillary refill.     Cervical back: Normal range of motion and neck supple. No swelling, edema,  deformity, erythema, signs of trauma, lacerations, rigidity, torticollis or crepitus. No pain with movement. Normal range of motion.     Thoracic back: No swelling, edema, deformity, signs of trauma, lacerations, spasms or tenderness. Normal range of motion.     Lumbar back: No swelling, edema, deformity, signs of trauma, lacerations, spasms or tenderness. Normal range of motion.     Right lower leg: No edema.     Left lower leg: No edema.  Lymphadenopathy:     Head:     Right side of head: No submandibular or preauricular adenopathy.     Left side of head: No submandibular or preauricular adenopathy.     Cervical: No cervical adenopathy.     Right cervical: No superficial cervical adenopathy.    Left cervical: No superficial cervical adenopathy.  Skin:    General: Skin is warm and dry.     Capillary Refill: Capillary refill takes less than 2 seconds.     Coloration: Skin is not ashen, cyanotic, jaundiced, mottled, pale or sallow.     Findings: No abrasion, abscess, acne, bruising, burn, ecchymosis, erythema, signs of injury, laceration, lesion, petechiae, rash or wound.     Nails: There is no clubbing.     Comments: Abdomen/flank left, face and hands inspected  Neurological:     General: No focal deficit present.     Mental Status: She is alert and oriented to person, place, and time. Mental status is at baseline.     GCS: GCS eye subscore is 4. GCS verbal subscore is 5. GCS motor subscore is 6.     Cranial Nerves: No cranial nerve deficit, dysarthria or facial asymmetry.     Sensory: Sensation is intact.     Motor: Motor function is intact. No weakness, tremor, atrophy, abnormal muscle tone or seizure activity.     Coordination: Coordination is intact. Coordination normal.     Gait: Gait is intact. Gait normal.     Comments: In/out of chair and on/off exam table without difficulty; gait sure and steady in clinic; bilateral hand grasp equal 5/5  Psychiatric:        Attention and  Perception: Attention and perception normal.        Mood and Affect: Mood and affect normal.        Speech: Speech normal.        Behavior: Behavior normal. Behavior is cooperative.        Thought Content: Thought content normal.        Cognition and Memory: Cognition and memory normal.        Judgment: Judgment normal.           Assessment & Plan:   A-muscle strain, essential hypertension  P-discussed with patient DDx flank/diaphragm/abdomen muscle strain with new weight lifting program.  Discussed  avoid increasing time/distance/repetitions/weight more than 10% per week to allow body time to acclimate to new regimen.  If a certain exercise if causing pain decrease weight and see if pain resolves if not skip that exercise this week and try again next week.  Notify NP if new or worsening symptoms.  Avoid breath holding during exercise/lifting.  DDx diaphragmatic hernia, muscle strain  Consider imaging if worsening.  Follow up prn.  Exitcare handouts muscle strain and diaphragm spasms.  Patient verbalized understanding information/instructions, agreed with plan of care and had no further questions at this time.  Taking her medications as prescribed.  Had stressful morning meetings at work and caffeine this am. May be related to her recent initiation of Blase Mess use last month also. Will see RN Olegario Messier this afternoon for repeat BP check.  BP not at goal at this time.  Will hold caffeine this afternoon.  Discussed ER if chest pain, worst headache of life, dyspnea or visual changes for re-evaluation.    Exitcare handout managing hypertension.  Patient verbalized understanding information/instructions, agreed with plan of care and had no further questions at this time.

## 2023-11-11 NOTE — Progress Notes (Signed)
 Employee in for B-12 injection in right deltoid.

## 2023-11-19 ENCOUNTER — Telehealth: Payer: Self-pay | Admitting: Registered Nurse

## 2023-11-19 DIAGNOSIS — I10 Essential (primary) hypertension: Secondary | ICD-10-CM

## 2023-11-19 MED ORDER — HYDROCHLOROTHIAZIDE 25 MG PO TABS
25.0000 mg | ORAL_TABLET | Freq: Every day | ORAL | 0 refills | Status: DC
Start: 2023-11-19 — End: 2024-04-20

## 2023-11-19 MED ORDER — HYDROCHLOROTHIAZIDE 25 MG PO TABS: 25.0000 mg | ORAL_TABLET | Freq: Every day | ORAL | 0 refills | Status: DC

## 2023-11-25 ENCOUNTER — Encounter: Payer: Self-pay | Admitting: Registered Nurse

## 2023-11-25 MED ORDER — HYDROCHLOROTHIAZIDE 25 MG PO TABS: 25.0000 mg | ORAL_TABLET | Freq: Every day | ORAL | Status: DC

## 2023-11-25 NOTE — Telephone Encounter (Signed)
 Patient had stopped taking second 25mg  tab in afternoon for leg swelling/BP greater than 140/90 only taking in am 25 mg now and has been losing weight.  Will have BP recheck with RN Olegario Messier.  Dispensed 90 tabs hydrochlorothiazide 25mg  to patient from [facility] today.

## 2023-11-30 ENCOUNTER — Ambulatory Visit

## 2023-11-30 NOTE — Progress Notes (Signed)
 BP check

## 2023-12-03 NOTE — Telephone Encounter (Signed)
 Patient reported abrasion healed no further questions or concerns.

## 2023-12-03 NOTE — Telephone Encounter (Signed)
 Patient BP with RN Olegario Messier 137/87 11/30/23.  Patient started on straterra 07/16/23 but has had weight loss intentional and thought she could decrease hydrochlorothiazide from 2 to 1 tab now but BP still elevated on recheck.  Discussed see RN Olegario Messier for a couple more checks weekly of BP so trend can be determined.  Patient had appt with [facility] but PCM office moved now further away so looking for new PCM closer to home/work.  Patient feeling well denied concerns at this time.  Continuing to walk for exercise and have healthier diet choices to continue weight loss efforts.  BP may be related to strattera but also stressors at work where BP checked.  Patient works in [employer].

## 2023-12-03 NOTE — Telephone Encounter (Signed)
 Patient seen in workcenter stated she would see RN Olegario Messier for BP check today feeling well denied concerns. 11/30/23

## 2023-12-06 ENCOUNTER — Other Ambulatory Visit (HOSPITAL_BASED_OUTPATIENT_CLINIC_OR_DEPARTMENT_OTHER): Payer: Self-pay

## 2023-12-06 ENCOUNTER — Other Ambulatory Visit: Payer: Self-pay | Admitting: Internal Medicine

## 2023-12-06 MED ORDER — TIRZEPATIDE-WEIGHT MANAGEMENT 5 MG/0.5ML ~~LOC~~ SOAJ
5.0000 mg | SUBCUTANEOUS | 2 refills | Status: DC
  Filled 2023-12-06: qty 2, 28d supply, fill #0
  Filled 2024-01-03: qty 2, 28d supply, fill #1
  Filled 2024-01-13 – 2024-01-26 (×3): qty 2, 28d supply, fill #2

## 2023-12-07 ENCOUNTER — Ambulatory Visit

## 2023-12-07 NOTE — Progress Notes (Signed)
 Check BP and B12 injection

## 2023-12-19 ENCOUNTER — Telehealth: Payer: Self-pay | Admitting: Registered Nurse

## 2023-12-19 ENCOUNTER — Encounter: Payer: Self-pay | Admitting: Registered Nurse

## 2023-12-19 DIAGNOSIS — H109 Unspecified conjunctivitis: Secondary | ICD-10-CM

## 2023-12-19 NOTE — Telephone Encounter (Signed)
 Patient reported had teledoc appt for eye infection and was prescribed ofloxacin.  Now having same symptoms in the other eye and worried she is going to run out of drops before completely treating infection.  Patient denied vision changes, had red eye and discharge prior to starting ofloxacin  Discussed with patient per epocrates dosing is 1-2 drops every 4 hours x 2 days then 1 drop every 6 hours x 5 days.  Patient stated she initiated therapy last Saturday for one eye so today Friday last day of treatment and other eye  started 2 days ago so now should decrease to 1 drop every 6 hours x 5 days.  Exitcare handout on bacterial conjunctivitis sent to my chart.  Patient instructed to wash/change her pillowcase if eye discharge on fabric.  Cleared for work [employer], [employer] discussed. Dispose of current contacts and case. Patient to apply warm or cool packs prn right eye 5 minutes TID prn or warm compress.  Patient stated she feels better with eye closed  Discussed during her work break as on computer during work day best to close eyes and rest.  Refresh drops 2 right eye TID x 7 days may be used in between ofloxacin but wait 1 hour before administering after antibiotic.  Discussed blinking irritates eye blood vessels further as eyelid vessels rub on bulbar vessals. Instructed patient to not rub eyes. May need to wash pillowcases more frequently until infection resolves if discharge noted on pillow. May use over the counter eye drops/tears such as visine per manufacturer instructions for pain/symptom relief. Return to clinic if headache, fever greater than 100.9F, nausea/vomiting, purulent discharge/matting unable to open eye without using fingers after 24 hours of medication use, foreign body sensation, ciliary flush, worsening photophobia or vision. Discussed to see optometrist same day if visual field loss, worsening light sensitivity, orbital swelling.  Call or return to clinic as needed if these symptoms worsen or fail to  improve as anticipated. Mild injection noted right eye 1-2+/4 no debris noted in eyebrows/eyelashes.  Patient able to read normal print phone/papers easily  Denied fever/chills/headache/photophobia.  Skin warm dry and pink.  Full EOM.  Patient verbalized agreement and understanding of treatment plan and had no further questions at this time.  P2: Hand washing, avoid contact use-wear glasses.

## 2023-12-30 ENCOUNTER — Ambulatory Visit: Admitting: Registered Nurse

## 2023-12-30 ENCOUNTER — Encounter: Payer: Self-pay | Admitting: Registered Nurse

## 2023-12-30 VITALS — Resp 16

## 2023-12-30 DIAGNOSIS — B079 Viral wart, unspecified: Secondary | ICD-10-CM

## 2023-12-30 DIAGNOSIS — L309 Dermatitis, unspecified: Secondary | ICD-10-CM

## 2023-12-30 MED ORDER — SALICYLIC ACID 6 % EX CREA: 1.0000 | TOPICAL_CREAM | Freq: Every day | CUTANEOUS | Status: AC

## 2023-12-30 NOTE — Patient Instructions (Addendum)
 Inflamed Skin (Atopic Dermatitis): What to Know Atopic dermatitis is a skin condition that causes dry, itchy, and inflamed skin. It's the most common type of eczema, which is a group of skin conditions that make your skin feel rough and puffy. This condition often gets worse in the winter and better in the summer. Atopic dermatitis usually starts in childhood and can last into adulthood. It's not contagious, so it does not spread from person to person. Your symptoms may get worse when you're having a flare-up. During a flare-up, your symptoms may get worse and bother you. What are the causes? The exact cause of this condition isn't known. Flare-ups can be triggered by: Contact with things you're sensitive or allergic to. Stress. Some foods. Very hot or cold weather. Harsh chemicals and soaps. Dry air. Chlorine. What increases the risk? You're more likely to get this condition if you have a personal or family history of: Eczema. Allergies. Asthma. Hay fever. What are the signs or symptoms?  Dry, scaly skin. Red, brown, purple, or grayish rash. Itchiness. Thick and cracked skin over time. How is this diagnosed? This condition is diagnosed based on: Symptoms. Physical exam. Medical history. How is this treated? There's no cure for this condition, but you can manage your symptoms. Do this by: Controlling your itchiness and scratching with antihistamine medicine or steroid creams. Avoiding allergens or triggers. Managing stress. Trying light therapy, also called phototherapy if other treatments don't work or if it's all over your body. Follow these instructions at home: Skin care  Keep your skin hydrated. To do this: Use unscented lotions that contain petroleum. Avoid lotions with alcohol or water. These can dry out your skin more. Take short baths or showers (less than 5 minutes). Use warm water instead of hot water. Use mild, unscented soaps. Avoid bubble bath. Put lotion on  right after bathing. Do not put anything on your skin without checking with your health care provider. General instructions Take or apply your medicines only as told. Wear clothes made of cotton or cotton blends. Dress lightly to avoid itching that can be caused by heat. When doing laundry, rinse your clothes twice to remove all soap. Use soap that doesn't have dyes and perfumes. Avoid triggers that cause flare-ups. Avoid scratching. It can make the rash and itching worse and can lead to infection. Keep fingernails short to avoid scratching open the skin. Avoid people who have cold sores or fever blisters. These infections can make your condition worse. Keep all follow-up visits to make sure your treatment plan is working. Contact a health care provider if: Your itching affects your sleep. Your rash gets worse or doesn't get better after a week of treatment. You have a fever. You have a rash after being around someone with cold sores or fever blisters. You have warmth or pus in the rash area. You have soft yellow scabs in the rash area. This information is not intended to replace advice given to you by your health care provider. Make sure you discuss any questions you have with your health care provider. Document Revised: 01/26/2023 Document Reviewed: 01/26/2023 Elsevier Patient Education  2024 [employer]vier Inc[employer]Warts  Warts are small growths on the skin. They are common and can occur on many areas of the body. A person may have one wart or several warts. In many cases, warts do not need treatment. They usually go away on their own over a period of many months to a few years. If needed, warts that cause  problems or do not go away on their own can be treated. What are the causes? Warts are caused by a type of virus called human papillomavirus (HPV). HPV can spread from person to person through direct contact. Warts can also spread to other areas of the body when a person scratches a wart and  then scratches another area of his or her body. What increases the risk? You are more likely to develop this condition if: You are 45-7 years old. You have a weakened body defense system (immune system). You are Caucasian. What are the signs or symptoms? The main symptom of this condition is small growths on the skin. Warts may: Be round or oval or have an irregular shape. Have a rough surface. Range in color from skin color to light yellow, brown, or gray. Generally be less than  inch (1.3 cm) in size. Go away and then come back again. Most warts are painless, but some can be painful if they are large or occur in an area of the body where pressure is applied to them, such as the bottom of the foot. How is this diagnosed? A wart can usually be diagnosed based on its appearance. In some cases, a tissue sample may be removed (biopsy) to be looked at under a microscope. How is this treated? In many cases, warts do not need treatment. Sometimes treatment is wanted. If treatment is needed or wanted, options may include: Applying medicated solutions, creams, or patches to the wart. These may be over-the-counter or prescription medicines that make the skin soft so that layers will gradually shed away. In many cases, the medicine is applied one or two times per day and covered with a bandage. Putting duct tape over the top of the wart (occlusion). You will leave the tape in place for as long as told by your health care provider and then replace it with a new strip of tape. This is done until the wart goes away. Freezing the wart with liquid nitrogen (cryotherapy). Burning the wart with: Laser treatment. An electrified probe (electrocautery). Injection of a medicine into the wart to help the body's immune system fight off the wart. Surgery to remove the wart. Follow these instructions at home: Medicines Use over-the-counter and prescription medicines only as told by your health care  provider. Do not use over-the-counter wart medicines on your face or genitals unless your health care provider tells you to do that. Lifestyle Keep your immune system healthy. To do this: Eat a healthy, balanced diet. Get enough sleep. Do not use any products that contain nicotine or tobacco. These products include cigarettes, chewing tobacco, and vaping devices, such as e-cigarettes. If you need help quitting, ask your health care provider. General instructions  Wash your hands after you touch a wart. Do not scratch or pick at a wart. Avoid shaving hair that is over a wart. Keep all follow-up visits. This is important. Contact a health care provider if: Your warts do not improve after treatment. You have redness, swelling, or pain at the site of a wart. You have bleeding from a wart that does not stop with light pressure. You have diabetes and you develop a wart. Summary Warts are small growths on the skin. They are common and can occur on many areas of the body. In many cases, warts do not need treatment. Sometimes treatment is wanted. If treatment is needed or wanted, there are several treatment options. Apply over-the-counter and prescription medicines only as told by your  health care provider. Wash your hands after you touch a wart. Keep all follow-up visits. This is important. This information is not intended to replace advice given to you by your health care provider. Make sure you discuss any questions you have with your health care provider. Document Revised: 09/26/2021 Document Reviewed: 09/26/2021 Elsevier Patient Education  2024 [employer].

## 2023-12-30 NOTE — Progress Notes (Signed)
 Established Patient Office Visit  Subjective   Patient ID: Olivia Hamilton, female    DOB: [birth year]  Age: 58 y.o. MRN: [id number]  Chief Complaint  Patient presents with   thumb wart   Rash    Right hand/thumb I think my warts spread and skin very dry on right fingers    57y/o single established caucasian female her for shaving 3 warts right thumb.  Patient reported had had liquid nitrogen freezing with PCM after shave at [facility] but did not start salacylic acid or duct tape treatment after that.  Skin on hands has been very dry all winter.  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  She does apply emollient daily.      Review of Systems  Constitutional:  Positive for weight loss. Negative for chills, diaphoresis and fever.  Respiratory:  Negative for cough and stridor.   Skin:  Positive for itching and rash.  Neurological:  Negative for weakness.      Objective:     Resp 16    Physical Exam Vitals and nursing note reviewed.  Constitutional:      General: She is awake. She is not in acute distress.    Appearance: Normal appearance. She is well-developed and well-groomed. She is obese. She is not ill-appearing, toxic-appearing or diaphoretic.  HENT:     Head: Normocephalic and atraumatic.     Jaw: There is normal jaw occlusion.     Salivary Glands: Right salivary gland is not diffusely enlarged. Left salivary gland is not diffusely enlarged.     Right Ear: Hearing and external ear normal. No decreased hearing noted.     Left Ear: Hearing and external ear normal. No decreased hearing noted.     Nose: Nose normal. No congestion or rhinorrhea.     Mouth/Throat:     Lips: Pink. No lesions.     Mouth: Mucous membranes are moist. No oral lesions or angioedema.     Dentition: No gum lesions.     Tongue: No lesions. Tongue does not deviate from midline.     Palate: No mass and lesions.     Pharynx: Oropharynx is clear. Uvula midline.  Eyes:      General: Lids are normal. Vision grossly intact. Gaze aligned appropriately. Allergic shiner present. No scleral icterus.       Right eye: No discharge.        Left eye: No discharge.     Extraocular Movements: Extraocular movements intact.     Conjunctiva/sclera: Conjunctivae normal.     Pupils: Pupils are equal, round, and reactive to light.  Neck:     Trachea: Trachea and phonation normal.  Cardiovascular:     Rate and Rhythm: Normal rate and regular rhythm.     Pulses: Normal pulses.          Radial pulses are 2+ on the right side and 2+ on the left side.  Pulmonary:     Effort: Pulmonary effort is normal. No respiratory distress.     Breath sounds: Normal breath sounds and air entry. No stridor or transmitted upper airway sounds. No wheezing.     Comments: Spoke full sentences without difficulty; no cough observed in exam room Abdominal:     General: Abdomen is flat.  Musculoskeletal:        General: No signs of injury. Normal range of motion.     Right hand: No swelling or lacerations. Normal range of motion. Normal strength.  Normal capillary refill.     Left hand: No swelling or lacerations. Normal range of motion. Normal strength. Normal capillary refill.     Cervical back: Normal range of motion and neck supple. No edema, erythema, signs of trauma, rigidity, torticollis or crepitus. Normal range of motion.     Right lower leg: No edema.     Left lower leg: No edema.  Lymphadenopathy:     Head:     Right side of head: No submandibular or preauricular adenopathy.     Left side of head: No submandibular or preauricular adenopathy.     Cervical:     Right cervical: No superficial cervical adenopathy.    Left cervical: No superficial cervical adenopathy.  Skin:    General: Skin is warm and dry.     Capillary Refill: Capillary refill takes less than 2 seconds.     Coloration: Skin is not ashen, cyanotic, jaundiced, mottled, pale or sallow.     Findings: Rash present. No  abrasion, abscess, acne, bruising, burn, ecchymosis, erythema, signs of injury, laceration, petechiae or wound. Rash is papular and scaling. Rash is not crusting, nodular, purpuric, pustular, urticarial or vesicular.     Nails: There is no clubbing.     Comments: Right anterior thumb 3 warts visualized size 2-70mm papule dry scaly with punctate center first overlying PIP joint and 3rd overlying interphalangeal joint and 2nd located centrally between; fissured and scaling skin bilateral palmar surfaces of hands/fingers; no fluctuance or increased temperature; ashy flaky calloused skin overlying dorsum and palmar hands/fingers   Neurological:     General: No focal deficit present.     Mental Status: She is alert and oriented to person, place, and time. Mental status is at baseline.     GCS: GCS eye subscore is 4. GCS verbal subscore is 5. GCS motor subscore is 6.     Cranial Nerves: No cranial nerve deficit, dysarthria or facial asymmetry.     Sensory: Sensation is intact.     Motor: Motor function is intact. No weakness, tremor, atrophy, abnormal muscle tone or seizure activity.     Coordination: Coordination is intact. Coordination normal.     Gait: Gait is intact. Gait normal.     Comments: In/out of chair without difficulty; gait sure and steady in clinic; bilateral hand grasp equal 5/5  Psychiatric:        Attention and Perception: Attention and perception normal.        Mood and Affect: Mood and affect normal.        Speech: Speech normal.        Behavior: Behavior normal. Behavior is cooperative.        Thought Content: Thought content normal.        Cognition and Memory: Cognition and memory normal.        Judgment: Judgment normal.   Verbal consent obtained for procedure.  Site cleansed with iodine patient denied allergy to iodine.  Treated with shaving callous with #10 blade until pinpoint of blood noted and slight tenderness. Band-Aid and triple antibiotic applied from clinic stock and  given 1 week supply triple antibiotic and bandages from clinic stock.   Patient reported minimal pain after procedure. Discussed with patient to keep clean and dry.  Tomorrow apply salacylic acid per manufacturer's instructions otc.    Follow up if wart regrowing in 7-14 days for reshave.  Cryotherapy not available in [facility] clinic.  Discussed with patient 2-3  treatments may be required to  eradicate wart. Exitcare handout on warts . Patient notified to expect scab at treated area but if erythema, purulent drainage, red streak radiating up hand/foot to return to clinic for reevaluation.   Bandaid clean dry and intact on discharge from clinic ambulatory.  EBL minimal less than 1ml.  Bleeding controlled with application bandaid and did not saturate gauze.    No results found for any visits on 12/30/23.    The 10-year ASCVD risk score (Arnett DK, et al., 2019) is: 3.8%    Assessment & Plan:   Problem List Items Addressed This Visit   None Visit Diagnoses       Hand dermatitis    -  Primary     Viral wart on right thumb         Verbal consent obtained and shaved lesions x3 until capillary bleeding observed; Applied iodine to skin prior to procedure to palmar/anterior surface right thumb  Reshaved right thumb and patient to use salacytic acid over shaved lesion this week and apply duct tape.  Discussed with callous gone acid can penetrate to virus easier after shaving off hard skin.  Other alternative return to [facility] office for liquid nitrogen treatment.  Patient to cover with bandaid today and monitor for signs of infection e.g. swelling/redness/purulent drainage/tenderness and notify clinic staff if these occur.  Follow up reshave prn 7-10 days.  Patient verbalized understanding information/instructions, agreed with plan of care and had no further questions at this time.   Discussed ensure when washing hands wetting with water first then applying soap. Do not apply soap to dry hands and then apply  water.  Apply lotion after bathing and in am upon awakening.  Discussed squeeze or scoop emollients preferred over pump.  As pumps tend to have rubbing alcohol to help them flow through pump/drying to skin.  Avoid hot water hand washing use tepid to cool.  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  Discussed steroid cream not needed at this time but to increase emollient application throughout the day (multiple times).  Patient verbalized understanding information/instructions, agreed with plan of care and had no further questions at this time  Return if symptoms worsen or fail to improve, for reshave EHW  or freeze with PCM 7-10 days.    Richardine Chancy, NP

## 2024-01-13 ENCOUNTER — Other Ambulatory Visit (HOSPITAL_BASED_OUTPATIENT_CLINIC_OR_DEPARTMENT_OTHER): Payer: Self-pay

## 2024-01-24 ENCOUNTER — Other Ambulatory Visit (HOSPITAL_BASED_OUTPATIENT_CLINIC_OR_DEPARTMENT_OTHER): Payer: Self-pay

## 2024-01-26 ENCOUNTER — Other Ambulatory Visit (HOSPITAL_BASED_OUTPATIENT_CLINIC_OR_DEPARTMENT_OTHER): Payer: Self-pay

## 2024-02-07 ENCOUNTER — Other Ambulatory Visit (HOSPITAL_BASED_OUTPATIENT_CLINIC_OR_DEPARTMENT_OTHER): Payer: Self-pay

## 2024-02-07 MED ORDER — ZEPBOUND 5 MG/0.5ML ~~LOC~~ SOAJ
5.0000 mg | SUBCUTANEOUS | 2 refills | Status: AC
Start: 2024-02-07 — End: ?
  Filled 2024-02-07 – 2024-02-21 (×3): qty 6, 84d supply, fill #0
  Filled 2024-05-24: qty 6, 84d supply, fill #1
  Filled 2024-08-21: qty 6, 84d supply, fill #2

## 2024-02-11 ENCOUNTER — Other Ambulatory Visit (HOSPITAL_BASED_OUTPATIENT_CLINIC_OR_DEPARTMENT_OTHER): Payer: Self-pay

## 2024-02-15 ENCOUNTER — Telehealth: Payer: Self-pay

## 2024-02-15 NOTE — Telephone Encounter (Signed)
 Copied from CRM [id number]. Topic: Appointments - Transfer of Care >> Feb 15, 2024 11:59 AM Lajean Pike wrote: Pt is requesting to transfer FROM: Provider: Carollynn Cirri  Pt is requesting to transfer TO: Guerry Leek" NP  Reason for requested transfer: Establish new care; sister is an existing patient and location is closer.  It is the responsibility of the team the patient would like to transfer to [facility] "Matt" NP  to reach out to the patient if for any reason this transfer is not acceptable.

## 2024-02-21 ENCOUNTER — Other Ambulatory Visit (HOSPITAL_BASED_OUTPATIENT_CLINIC_OR_DEPARTMENT_OTHER): Payer: Self-pay

## 2024-03-03 LAB — BASIC METABOLIC PANEL WITH GFR
BUN: 11 (ref 4–21)
CO2: 25 — AB (ref 13–22)
Chloride: 101 (ref 99–108)
Creatinine: 0.8 (ref 0.5–1.1)
Glucose: 79
Potassium: 4.3 meq/L (ref 3.5–5.1)
Sodium: 141 (ref 137–147)

## 2024-03-03 LAB — CBC AND DIFFERENTIAL
A1c: 5.2
EGFR: 89
HCT: 44 (ref 36–46)
Hemoglobin: 14.3 (ref 12.0–16.0)
Neutrophils Absolute: 2.8
Platelets: 262 K/uL (ref 150–400)
WBC: 5.5

## 2024-03-03 LAB — CBC: RBC: 4.75 (ref 3.87–5.11)

## 2024-03-03 LAB — HEPATIC FUNCTION PANEL
ALT: 12 U/L (ref 7–35)
AST: 18 (ref 13–35)
Alkaline Phosphatase: 87 (ref 25–125)
Bilirubin, Total: 0.5

## 2024-03-03 LAB — COMPREHENSIVE METABOLIC PANEL WITH GFR
Albumin: 4.2 (ref 3.5–5.0)
Globulin: 2.3
eGFR: 89

## 2024-03-03 LAB — TESTOSTERONE: Testosterone: 22.2

## 2024-03-03 LAB — HEMOGLOBIN A1C: Hemoglobin A1C: 5.2

## 2024-03-03 LAB — IRON,TIBC AND FERRITIN PANEL: Ferritin: 340

## 2024-03-03 LAB — MICROALBUMIN / CREATININE URINE RATIO: Microalb Creat Ratio: 14

## 2024-03-15 ENCOUNTER — Telehealth: Payer: Self-pay | Admitting: Registered Nurse

## 2024-03-15 ENCOUNTER — Encounter: Payer: Self-pay | Admitting: Registered Nurse

## 2024-03-15 DIAGNOSIS — I10 Essential (primary) hypertension: Secondary | ICD-10-CM

## 2024-03-15 DIAGNOSIS — Z0000 Encounter for general adult medical examination without abnormal findings: Secondary | ICD-10-CM

## 2024-03-15 NOTE — Telephone Encounter (Signed)
 Patient brought [facility] printout to clinic for completion of her [facility] Well appt.  Patient signed tobacco attestation, HIPAA form and 2026 Be Well ROI  last BP on file 150/92 in FY 2025 LDL 117 Hgba1c 5.2 weight 780oad BMI 36.44.  Patient reported she will come to clinic next week to have weight and BP recheck as she has lost another 50lbs since last clinic weigh in.  Olivia Hamilton has been weighing her at their appts for weight loss.  Copy of labs placed in paper record at [facility] 4 pages.  [facility] form and provider portion Be Well 2026 paperwork completed by NP.  HR Jen given [employer] form for insurance discount starting 07 Jun 2024.

## 2024-04-20 ENCOUNTER — Encounter: Payer: Self-pay | Admitting: Nurse Practitioner

## 2024-04-20 ENCOUNTER — Ambulatory Visit (INDEPENDENT_AMBULATORY_CARE_PROVIDER_SITE_OTHER): Admitting: Nurse Practitioner

## 2024-04-20 VITALS — BP 120/80 | HR 65 | Temp 97.8°F | Ht 65.5 in | Wt 165.0 lb

## 2024-04-20 DIAGNOSIS — E039 Hypothyroidism, unspecified: Secondary | ICD-10-CM | POA: Diagnosis not present

## 2024-04-20 DIAGNOSIS — E663 Overweight: Secondary | ICD-10-CM | POA: Insufficient documentation

## 2024-04-20 DIAGNOSIS — Z0000 Encounter for general adult medical examination without abnormal findings: Secondary | ICD-10-CM

## 2024-04-20 DIAGNOSIS — I10 Essential (primary) hypertension: Secondary | ICD-10-CM | POA: Diagnosis not present

## 2024-04-20 DIAGNOSIS — R41840 Attention and concentration deficit: Secondary | ICD-10-CM | POA: Insufficient documentation

## 2024-04-20 DIAGNOSIS — E7800 Pure hypercholesterolemia, unspecified: Secondary | ICD-10-CM

## 2024-04-20 NOTE — Assessment & Plan Note (Signed)
 Patient has lost over 65 pounds currently maintained on Zepbound  5 mg weekly.  Continue working healthy lifestyle modifications continue Zepbound  as prescribed continue follow-up with clinic as recommended

## 2024-04-20 NOTE — Assessment & Plan Note (Signed)
 Currently maintained on hydrochlorothiazide  25 mg daily.  Blood pressure controlled.  Continue medication as prescribed

## 2024-04-20 NOTE — Patient Instructions (Signed)
 Nice to see you today I will be in touch with the labs once I have them Follow up with me in 1 year, sooner if you need me

## 2024-04-20 NOTE — Assessment & Plan Note (Signed)
 Discussed age-appropriate immunizations and screening exams.  Did review patient's personal, surgical, social, family histories.  Patient refused all vaccines stating her integrative medicine doc advised against getting vaccines.  Patient needs CRC referral she wants to see what her sister went and then will let me know where she would like to go.  Patient up-to-date on cervical cancer screening and breast cancer screening.  Patient was given information at discharge about preventative healthcare maintenance with anticipatory guidance.

## 2024-04-20 NOTE — Assessment & Plan Note (Signed)
 History of the same most recent LDL 117.  Patient has lost approximately 65 pounds continue working on healthy lifestyle modifications eating a lower fat diet

## 2024-04-20 NOTE — Progress Notes (Signed)
 Established Patient Office Visit  Subjective   Patient ID: Olivia Hamilton, female    DOB: [birth year]  Age: 58 y.o. MRN: [id number]  Chief Complaint  Patient presents with   Establish Care    HPI  ADD: she has ADD and is on straetta 18. Virtual visit with a tele doc  HTN: currenlty maintained on hydrochlorothiazide  25mg . Does not check at home    Hypothyroidism: [facility] synthyroid and cytomel  Estrongen, progesterone and testosterone  through [facility]   HLD: currently maintained on lifestyle modifications. Last LDL was 117.  for complete physical and follow up of chronic conditions.  Immunizations: -Tetanus:  refused  -Influenza: out of season  -Shingles: refused  -Pneumonia: refused    Diet: Fair diet. She is eating breakfast and lunch that are smaller with protein. Her dinner is larger. Does not snack. She drinks water  Exercise:  Walks 3-5 times a week. She will do 1.5-2 miles  Eye exam: Completes annually. glasses  Dental exam: Completes semi-annually    Colonoscopy: Completed in needs referral will reach out to me  Lung Cancer Screening: NA   Pap smear: Dr. Mat [facility]. Done this year  Mammogram: 10/05/2023  Sleep: goes to bed around 11-12 and get up around 7-730. Feels rested some times. Feels fatigued after the zepbound  injection        Review of Systems  Constitutional:  Negative for chills and fever.  Respiratory:  Negative for shortness of breath.   Cardiovascular:  Negative for chest pain and leg swelling.  Gastrointestinal:  Negative for abdominal pain, blood in stool, constipation, diarrhea, nausea and vomiting.       BM daily to every 2-3 days   Genitourinary:  Negative for dysuria and hematuria.  Neurological:  Negative for dizziness, tingling and headaches.  Psychiatric/Behavioral:  Negative for hallucinations and suicidal ideas.       Objective:     BP 120/80   Pulse 65   Temp 97.8 F (36.6 C) (Oral)   Ht 5'  5.5 (1.664 m)   Wt 165 lb (74.8 kg)   SpO2 99%   BMI 27.04 kg/m  BP Readings from Last 3 Encounters:  04/20/24 120/80  12/07/23 130/83  11/30/23 137/87   Wt Readings from Last 3 Encounters:  04/20/24 165 lb (74.8 kg)  02/16/23 219 lb 3.2 oz (99.4 kg)  09/15/22 229 lb (103.9 kg)   SpO2 Readings from Last 3 Encounters:  04/20/24 99%  11/11/23 100%  09/09/23 98%      Physical Exam Vitals and nursing note reviewed.  Constitutional:      Appearance: Normal appearance.  HENT:     Right Ear: Tympanic membrane, ear canal and external ear normal.     Left Ear: Tympanic membrane, ear canal and external ear normal.     Mouth/Throat:     Mouth: Mucous membranes are moist.     Pharynx: Oropharynx is clear.  Eyes:     Extraocular Movements: Extraocular movements intact.     Pupils: Pupils are equal, round, and reactive to light.  Cardiovascular:     Rate and Rhythm: Normal rate and regular rhythm.     Pulses: Normal pulses.     Heart sounds: Normal heart sounds.  Pulmonary:     Effort: Pulmonary effort is normal.     Breath sounds: Normal breath sounds.  Abdominal:     General: Bowel sounds are normal. There is no distension.     Palpations: There is no  mass.     Tenderness: There is no abdominal tenderness.     Hernia: No hernia is present.  Musculoskeletal:     Right lower leg: No edema.     Left lower leg: No edema.  Lymphadenopathy:     Cervical: No cervical adenopathy.  Skin:    General: Skin is warm.  Neurological:     General: No focal deficit present.     Mental Status: She is alert.     Deep Tendon Reflexes:     Reflex Scores:      Bicep reflexes are 2+ on the right side and 2+ on the left side.      Patellar reflexes are 2+ on the right side and 2+ on the left side.    Comments: Bilateral upper and lower extremity strength 5/5  Psychiatric:        Mood and Affect: Mood normal.        Behavior: Behavior normal.        Thought Content: Thought content  normal.        Judgment: Judgment normal.      No results found for any visits on 04/20/24.    The 10-year ASCVD risk score (Arnett DK, et al., 2019) is: 3.2%    Assessment & Plan:   Problem List Items Addressed This Visit       Cardiovascular and Mediastinum   Benign essential HTN   Currently maintained on hydrochlorothiazide  25 mg daily.  Blood pressure controlled.  Continue medication as prescribed        Endocrine   Hypothyroidism   Maintained and managed by [facility] patient on Synthroid and liothyronine        Other   Preventative health care - Primary   Discussed age-appropriate immunizations and screening exams.  Did review patient's personal, surgical, social, family histories.  Patient refused all vaccines stating her integrative medicine doc advised against getting vaccines.  Patient needs [facility] referral she wants to see what her sister went and then will let me know where she would like to go.  Patient up-to-date on cervical cancer screening and breast cancer screening.  Patient was given information at discharge about preventative healthcare maintenance with anticipatory guidance.      Elevated LDL cholesterol level   History of the same most recent LDL 117.  Patient has lost approximately 65 pounds continue working on healthy lifestyle modifications eating a lower fat diet      Overweight   Patient has lost over 65 pounds currently maintained on Zepbound  5 mg weekly.  Continue working healthy lifestyle modifications continue Zepbound  as prescribed continue follow-up with clinic as recommended      Attention or concentration deficit   History of the same was maintained on Vyvanse in the past.  Patient currently doing a telehealth service and maintained on Strattera       Return in about 1 year (around 04/20/2025) for CPE and Labs.    Adina Crandall, NP

## 2024-04-20 NOTE — Assessment & Plan Note (Signed)
 History of the same was maintained on Vyvanse in the past.  Patient currently doing a telehealth service and maintained on Strattera

## 2024-04-20 NOTE — Assessment & Plan Note (Signed)
 Maintained and managed by [facility] patient on Synthroid and liothyronine

## 2024-04-26 ENCOUNTER — Encounter: Payer: Self-pay | Admitting: Nurse Practitioner

## 2024-05-03 ENCOUNTER — Ambulatory Visit: Payer: Self-pay | Admitting: Family

## 2024-05-17 ENCOUNTER — Telehealth: Payer: Self-pay | Admitting: Registered Nurse

## 2024-05-17 ENCOUNTER — Encounter: Payer: Self-pay | Admitting: Registered Nurse

## 2024-05-17 ENCOUNTER — Other Ambulatory Visit: Payer: Self-pay | Admitting: Registered Nurse

## 2024-05-17 DIAGNOSIS — B349 Viral infection, unspecified: Secondary | ICD-10-CM

## 2024-05-17 DIAGNOSIS — R110 Nausea: Secondary | ICD-10-CM

## 2024-05-17 MED ORDER — FLUTICASONE PROPIONATE 50 MCG/ACT NA SUSP: 1.0000 | Freq: Two times a day (BID) | NASAL | 3 refills | Status: AC | PRN

## 2024-05-17 MED ORDER — ONDANSETRON 4 MG PO TBDP: 4.0000 mg | ORAL_TABLET | Freq: Three times a day (TID) | ORAL | 0 refills | Status: DC | PRN

## 2024-05-17 NOTE — Telephone Encounter (Signed)
 Pharmacist requested change in rx as prior authorization required.

## 2024-05-17 NOTE — Telephone Encounter (Signed)
 Patient contacted NP having stomach issues last night and headache that started yesterday mild but worsening and now in entire face cheeks hurt, sore throat.  Has not home covid tested discussed there are 2 new variants circulating in [place]  and covid infections have been surging the past 3 weeks.  Patient denied fever/chills.  Having some fatigue and malaise.  Discussed to go home today if at work and home covid test.  [place] at [employer] work remote if she wants to and supervisor agrees.  Has ran out of zofran  electronic Rx for refill sent to her pharmacy of choice ondansetron  ODT 4mg  sig t1-2 po q8h prn n/v #12 RF0  Patient reported also needs refill on her fluticasone  nasal spray #[id number] RF3 sent to her pharmacy of choice.  Discussed ragweed and tree pollen moderate today locally and to start seasonal regimen.  Patient may use normal saline nasal spray 2 sprays each nostril q2h wa as needed. flonase  50mcg 1 spray each nostril BID  Patient denied personal or family history of ENT cancer.  OTC antihistamine of choice claritin/zyrtec 10mg  po daily.  Avoid triggers if possible.  Shower prior to bedtime if exposed to triggers.  If allergic dust/dust mites recommend mattress/pillow covers/encasements; washing linens, vacuuming, sweeping, dusting weekly.  Call or return to clinic as needed if these symptoms worsen or fail to improve as anticipated.   Exitcare handout on allergic rhinitis and sinus rinse given to patient.     I have recommended clear fluids and bland diet.  Avoid dairy/spicy, fried and large portions of meat while having nausea.  If vomiting hold po intake x 1 hour.  Then sips clear fluids like broths, ginger ale, power ade, gatorade, pedialyte may advance to soft/bland if no vomiting x 24 hours and appetite returned otherwise hydration main focus.  Return to the clinic if symptoms persist or worsen; I have alerted the patient to call if high fever, dehydration, marked weakness, fainting, increased  abdominal pain, blood in stool or vomit (red or black).   Exitcare handout on nausea/vomiting.  Discussed yesterday in clinic 3 patients with GI complaints so I suspect viral illness circulating locally.  I have recommended clear fluids and advanced to soft as tolerated.  Avoid dairy, spicy and fried foods until diarrhea resolves. Avoid dehydration drink noncaffeinated beverages (water, ginger ale, soup broth, popsicles, no sugar added gatorade/powerade) to urinate every 2-4 hours pale yellow urine.  It is easy to become dehydrated when having diarrhea along with electrolyte imbalances.  Patient to take  temperature and if less than 100.5 F may take over the counter Imodium per manufacturer's instructions.  Patient given exitcare handouts on diarrhea and foods to relieve diarrhea. Medications as directed.  Return to the clinic if symptoms persist or worsen; I have alerted the patient to call if high fever, dehydration, marked weakness, fainting, increased abdominal pain, blood in stool or vomit. Patient given work/school excuse note for 48 hours  supervisor notified. Patient verbalized agreement and understanding of treatment plan and had no further questions at this time. P2: Hand washing and fitness

## 2024-05-18 NOTE — Telephone Encounter (Signed)
 Patient reported did not work yesterday due to fatigue.  Home covid test today negative.  Diarrhea today work excuse extended 9/11-9/12/25 [employer] supervisor notified.  Discussed I have recommended clear fluids (soups, powerade, gatorade, ginger ale) and advanced to soft as tolerated.  Avoid dairy, spicy and fried foods until diarrhea resolves. Avoid dehydration drink noncaffeinated beverages (water, ginger ale, soup broth, popsicles, no sugar added gatorade/powerade) to urinate every 2-4 hours pale yellow urine.  It is easy to become dehydrated when having diarrhea along with electrolyte imbalances.  Patient given exitcare handouts on diarrhea and foods to relieve diarrhea. Medications as directed.  Return to the clinic if syPatient A&Ox3 spoke full sentences without difficulty.  If symptoms persist or worsen; I have alerted the patient to call if high fever, dehydration, marked weakness, fainting, increased abdominal pain, blood in stool or vomit. Patient verbalized agreement and understanding of treatment plan and had no further questions at this time. P2: Hand washing and fitness

## 2024-05-21 NOTE — Telephone Encounter (Signed)
 Spoke with patient via telephone A&Ox3 spoke full sentences without difficulty.  Stated all symptoms except fatigue/weakness have resolved.  Has been drinking water wondering if she has drank too much water.  Discussed having propel, gatorade/powerade, soup, fruits and vegetables today to replace electrolytes vitamins and minerals.  Patient stated will have chicken soup for dinner tonight as has not eaten dinner yet.  Denied new or worsening symptoms.  Would like to return to onsite work tomorrow.  Denied new symptoms or f/c/n/v/d in past 24 hours.  Discussed cleared to return onsite; HR team and supervisor notified cleared to return onsite for work 05/22/24.  Patient agreed with plan of care and had no further questions at this time.

## 2024-05-23 NOTE — Telephone Encounter (Signed)
 Patient reported feeling better today.  Denied concerns  A&Ox3 skin warm dry and pink respirations even and unlabored gait sure and steady in workcenter.  Saw PCM last week stated ears need cleaning out and will schedule appt with [facility] Thursday for procedure/re-evaluation.

## 2024-05-25 ENCOUNTER — Ambulatory Visit: Payer: Self-pay | Admitting: Registered Nurse

## 2024-05-25 ENCOUNTER — Encounter: Payer: Self-pay | Admitting: Registered Nurse

## 2024-05-25 DIAGNOSIS — H6121 Impacted cerumen, right ear: Secondary | ICD-10-CM

## 2024-05-25 DIAGNOSIS — H6992 Unspecified Eustachian tube disorder, left ear: Secondary | ICD-10-CM

## 2024-05-25 MED ORDER — DEBROX 6.5 % OT SOLN: 5.0000 [drp] | Freq: Two times a day (BID) | OTIC | 0 refills | Status: AC

## 2024-05-25 NOTE — Progress Notes (Signed)
 Established Patient Office Visit  Subjective   Patient ID: Olivia Hamilton, female    DOB: 03/11/1966  Age: 58 y.o. MRN: [id number]  Chief Complaint  Patient presents with   hearing loss right    Here for ear check bilateral decreased hearing    58y/o caucasian single patient here for evaluation of ears as has noted hearing loss bilaterally and popping in left ear only  Denied fever/chills/ear discharge/bleeding.  Had viral illness last week and PMHx allergic rhinitis.        Review of Systems  Constitutional:  Positive for malaise/fatigue. Negative for chills and fever.  HENT:  Positive for congestion, hearing loss and sore throat. Negative for ear discharge, ear pain, nosebleeds and sinus pain.   Eyes:  Negative for pain, discharge and redness.  Respiratory:  Negative for cough, hemoptysis, shortness of breath, wheezing and stridor.   Neurological:  Positive for headaches. Negative for dizziness, tingling, tremors, sensory change, speech change, focal weakness, seizures, loss of consciousness and weakness.      Objective:     There were no vitals taken for this visit.   Physical Exam Vitals and nursing note reviewed.  Constitutional:      General: She is awake. She is not in acute distress.    Appearance: Normal appearance. She is well-developed, well-groomed and normal weight. She is not ill-appearing, toxic-appearing or diaphoretic.  HENT:     Head: Normocephalic and atraumatic.     Jaw: There is normal jaw occlusion.     Salivary Glands: Right salivary gland is not diffusely enlarged or tender. Left salivary gland is not diffusely enlarged or tender.     Right Ear: Hearing and external ear normal. No decreased hearing noted. No laceration, drainage, swelling or tenderness. There is impacted cerumen. No foreign body. No mastoid tenderness.     Left Ear: Hearing and external ear normal. No decreased hearing noted. No laceration, drainage, swelling or tenderness. A  middle ear effusion is present. There is no impacted cerumen. No foreign body. No mastoid tenderness. No PE tube. No hemotympanum. Tympanic membrane is bulging. Tympanic membrane is not injected, scarred, perforated, erythematous or retracted.     Ears:     Comments: Left TM intact air fluid level 50% opacity no debris noted in auditory canal or erythema; right TM only able to visualize from 9-12 oclock; yellow cerumen 12-9 oclock right noted no purulent discharge or smell noted    Nose: Nose normal. No congestion or rhinorrhea.     Right Nostril: No epistaxis.     Left Nostril: No epistaxis.     Right Turbinates: Enlarged and swollen. Not pale.     Left Turbinates: Enlarged and swollen. Not pale.     Mouth/Throat:     Lips: Pink. No lesions.     Mouth: Mucous membranes are moist. No oral lesions or angioedema.     Dentition: No gum lesions.     Tongue: No lesions. Tongue does not deviate from midline.     Palate: No mass and lesions.     Pharynx: Uvula midline. Pharyngeal swelling and postnasal drip present. No oropharyngeal exudate, posterior oropharyngeal erythema or uvula swelling.     Tonsils: No tonsillar exudate.     Comments: Cobblestoning posterior pharynx; bilateral allergic shiners; audible nasal congestion/sniffing in exam room Eyes:     General: Lids are normal. Vision grossly intact. Gaze aligned appropriately. Allergic shiner present. No scleral icterus.       Right eye:  No discharge.        Left eye: No discharge.     Extraocular Movements: Extraocular movements intact.     Conjunctiva/sclera: Conjunctivae normal.     Pupils: Pupils are equal, round, and reactive to light.  Neck:     Trachea: Trachea and phonation normal.  Cardiovascular:     Rate and Rhythm: Normal rate and regular rhythm.  Pulmonary:     Effort: Pulmonary effort is normal. No respiratory distress.     Breath sounds: Normal breath sounds and air entry. No stridor or transmitted upper airway sounds. No  decreased breath sounds or wheezing.     Comments: Spoke full sentences without difficulty; no cough observed in exam room Abdominal:     General: Abdomen is flat.  Musculoskeletal:        General: Normal range of motion.     Right hand: Normal strength. Normal capillary refill.     Left hand: Normal strength. Normal capillary refill.     Cervical back: Normal range of motion and neck supple. No swelling, edema, deformity, erythema, signs of trauma, lacerations, rigidity, spasms, torticollis, tenderness or crepitus. No pain with movement. Normal range of motion.     Thoracic back: No swelling, edema, deformity, signs of trauma, lacerations, spasms or tenderness. Normal range of motion.     Right lower leg: No edema.     Left lower leg: No edema.  Lymphadenopathy:     Head:     Right side of head: No submental, submandibular, tonsillar, preauricular, posterior auricular or occipital adenopathy.     Left side of head: No submental, submandibular, tonsillar, preauricular, posterior auricular or occipital adenopathy.     Cervical: No cervical adenopathy.     Right cervical: No superficial, deep or posterior cervical adenopathy.    Left cervical: No superficial, deep or posterior cervical adenopathy.  Skin:    General: Skin is warm and dry.     Capillary Refill: Capillary refill takes less than 2 seconds.     Coloration: Skin is not ashen, cyanotic, jaundiced, mottled, pale or sallow.     Findings: No abrasion, abscess, acne, bruising, burn, ecchymosis, erythema, signs of injury, laceration, lesion, petechiae, rash or wound.     Nails: There is no clubbing.     Comments: Anterior face neck and hands visually inspected  Neurological:     General: No focal deficit present.     Mental Status: She is alert and oriented to person, place, and time. Mental status is at baseline.     GCS: GCS eye subscore is 4. GCS verbal subscore is 5. GCS motor subscore is 6.     Cranial Nerves: No cranial nerve  deficit, dysarthria or facial asymmetry.     Sensory: Sensation is intact.     Motor: Motor function is intact. No weakness, tremor, atrophy, abnormal muscle tone or seizure activity.     Coordination: Coordination is intact. Coordination normal.     Gait: Gait is intact. Gait normal.     Comments: In/out of chair and on/off exam table without difficulty; gait sure and steady in clinic; bilateral hand grasp equal 5/5  Psychiatric:        Attention and Perception: Attention and perception normal.        Mood and Affect: Mood and affect normal.        Speech: Speech normal.        Behavior: Behavior normal. Behavior is cooperative.  Thought Content: Thought content normal.        Cognition and Memory: Cognition and memory normal.        Judgment: Judgment normal.      No results found for any visits on 05/25/24.    The 10-year ASCVD risk score (Arnett DK, et al., 2019) is: 3.2%    Assessment & Plan:   Problem List Items Addressed This Visit   None Visit Diagnoses       Hearing loss of right ear due to cerumen impaction    -  Primary     Eustachian tube dysfunction, left         Verbal consent obtained for syringe irrigation and curettage by NP.  Patient reported slight discomfort external ear canal after procedure curretage extraction right ear, minor redness noted canal 9 oclock right and TM intact without erythema able to visualize 8-12 oclock only right.  Patient reported sounds are louder now.  Try 5-10gtts mineral oil external auditory canal daily  or debrox gtts 5 BID x 5 days and may put cotton ball at external ear for 20 minutes after drops instilled.  When in clinic have provider check ears to see if buildup with this method again.  Discussed purpose of earwax with patient.  Avoid cotton applicator (Q-tip) use in ears.  exitcare handout on cerumen impaction  Patient verbalized understanding, agreed with plan of care and had no further questions at this time.    Re-evaluation clinic next Tuesday with NP     No evidence of invasive bacterial infection, non toxic and well hydrated.  I do not see where any further testing or imaging is necessary at this time.   I will suggest supportive care, rest, good hygiene and encourage the patient to take adequate fluids.  The patient is to return to clinic or EMERGENCY ROOM if symptoms worsen or change significantly e.g. ear pain, fever, purulent discharge from ears or bleeding.  Exitcare handout on otitis media and eustachian tube dysfunction printed and given to patient.  Discussed with patient post nasal drip irritates throat/causes swelling blocks eustachian tubes from draining and fluid fills up middle ear.  Bacteria/viruses can grow in fluid and with moving head tube compressed and increases pressure in tube/ear worsening pain.  Studies show will take 30 days for fluid to resolve after post nasal drip controlled with nasal steroid/antihistamine.  Restart antihistamine of choice e.g. claritin/zyrtec 10mg  po daily, nasal saline 2 sprays each nostril q2h prn congesiton while awake and fluticasone  nasal 50mcg 1 spray each nostril BID prn congestion/rhinitis.   Antibiotics and steroids do not speed up fluid removal.  Patient verbalized agreement and understanding of treatment plan and had no further questions at this time.   Return in about 5 days (around 05/30/2024) for re-evaluation.    Olivia DELENA Hope, NP

## 2024-05-25 NOTE — Patient Instructions (Signed)
 Earwax Buildup, Adult Your ears make something called earwax. It helps keep germs called bacteria away and protects the skin in your ears. Sometimes, too much earwax can build up. This can cause discomfort or make it harder to hear. What are the causes? Earwax buildup can happen when you have too much earwax in your ears. Earwax is made in the outer part of your ear canal. It's supposed to fall out in small amounts over time. But if your ears aren't able to clean themselves like they should, earwax can build up. What increases the risk? You're more likely to get earwax buildup if: You clean your ears with cotton swabs. You pick at your ears. You use earplugs or in-ear headphones a lot. You wear hearing aids. You may also be more likely to get it if: You're female. You're older. Your ears naturally make more earwax. You have narrow ear canals or extra hair in your ears. Your earwax is too thick or sticky. You have eczema. You're dehydrated. This means there's not enough fluid in your body. What are the signs or symptoms? Symptoms of earwax buildup include: Not being able to hear as well. A feeling of fullness in your ear. Feeling like your ear is plugged. Fluid coming from your ear. Ear pain or an itchy ear. Ringing in your ear. Coughing or problems with balance. How is this diagnosed? Earwax buildup may be diagnosed based on your symptoms, medical history, and an ear exam. During the exam, your health care provider will look into your ear with a tool called an otoscope. You may also have tests, such as a hearing test. How is this treated? Earwax buildup may be treated by: Using ear drops. Having the earwax removed by a provider. The provider may: Flush the ear with water. Use a tool called a curette that has a loop on the end. Use a suction device. Having surgery. This may be done in severe cases. Follow these instructions at home:  Cleaning your ears Clean your ears as told  by your provider. You can clean the outside of your ears with a washcloth or tissue. Do not overclean your ears. Do not put anything into your ear unless told. This includes cotton swabs. General instructions Take over-the-counter and prescription medicines only as told by your provider. Drink enough fluid to keep your pee (urine) pale yellow. This helps thin the earwax. If you have hearing aids, clean them as told. Keep all follow-up visits. If earwax builds up in your ears often or if you use hearing aids, ask your provider how often you should have your ears cleaned. Contact a health care provider if: Your ear pain gets worse. You have a fever. You have pus, blood, or other fluid coming from your ear. You have hearing loss. You have ringing in your ears that won't go away. You feel like the room is spinning. This is called vertigo. Your symptoms don't get better with treatment. This information is not intended to replace advice given to you by your health care provider. Make sure you discuss any questions you have with your health care provider. Document Revised: 11/05/2022 Document Reviewed: 11/05/2022 Elsevier Patient Education  2024 [employer]Eustachian Tube Dysfunction  Eustachian tube dysfunction refers to a condition in which a blockage develops in the narrow passage that connects the middle ear to the back of the nose (eustachian tube). The eustachian tube regulates air pressure in the middle ear by letting air move between the ear and nose.  It also helps to drain fluid from the middle ear space. Eustachian tube dysfunction can affect one or both ears. When the eustachian tube does not function properly, air pressure, fluid, or both can build up in the middle ear. What are the causes? This condition occurs when the eustachian tube becomes blocked or cannot open normally. Common causes of this condition include: Ear infections. Colds and other infections that affect the nose,  mouth, and throat (upper respiratory tract). Allergies. Irritation from cigarette smoke. Irritation from stomach acid coming up into the esophagus (gastroesophageal reflux). The esophagus is the part of the body that moves food from the mouth to the stomach. Sudden changes in air pressure, such as from descending in an airplane or scuba diving. Abnormal growths in the nose or throat, such as: Growths that line the nose (nasal polyps). Abnormal growth of cells (tumors). Enlarged tissue at the back of the throat (adenoids). What increases the risk? You are more likely to develop this condition if: You smoke. You are overweight. You are a child who has: Certain birth defects of the mouth, such as cleft palate. Large tonsils or adenoids. What are the signs or symptoms? Common symptoms of this condition include: A feeling of fullness in the ear. Ear pain. Clicking or popping noises in the ear. Ringing in the ear (tinnitus). Hearing loss. Loss of balance. Dizziness. Symptoms may get worse when the air pressure around you changes, such as when you travel to an area of high elevation, fly on an airplane, or go scuba diving. How is this diagnosed? This condition may be diagnosed based on: Your symptoms. A physical exam of your ears, nose, and throat. Tests, such as those that measure: The movement of your eardrum. Your hearing (audiometry). How is this treated? Treatment depends on the cause and severity of your condition. In mild cases, you may relieve your symptoms by moving air into your ears. This is called popping the ears. In more severe cases, or if you have symptoms of fluid in your ears, treatment may include: Medicines to relieve congestion (decongestants). Medicines that treat allergies (antihistamines). Nasal sprays or ear drops that contain medicines that reduce swelling (steroids). A procedure to drain the fluid in your eardrum. In this procedure, a small tube may be  placed in the eardrum to: Drain the fluid. Restore the air in the middle ear space. A procedure to insert a balloon device through the nose to inflate the opening of the eustachian tube (balloon dilation). Follow these instructions at home: Lifestyle Do not do any of the following until your health care provider approves: Travel to high altitudes. Fly in airplanes. Work in a Estate agent or room. Scuba dive. Do not use any products that contain nicotine or tobacco. These products include cigarettes, chewing tobacco, and vaping devices, such as e-cigarettes. If you need help quitting, ask your health care provider. Keep your ears dry. Wear fitted earplugs during showering and bathing. Dry your ears completely after. General instructions Take over-the-counter and prescription medicines only as told by your health care provider. Use techniques to help pop your ears as recommended by your health care provider. These may include: Chewing gum. Yawning. Frequent, forceful swallowing. Closing your mouth, holding your nose closed, and gently blowing as if you are trying to blow air out of your nose. Keep all follow-up visits. This is important. Contact a health care provider if: Your symptoms do not go away after treatment. Your symptoms come back after treatment. You are  unable to pop your ears. You have: A fever. Pain in your ear. Pain in your head or neck. Fluid draining from your ear. Your hearing suddenly changes. You become very dizzy. You lose your balance. Get help right away if: You have a sudden, severe increase in any of your symptoms. Summary Eustachian tube dysfunction refers to a condition in which a blockage develops in the eustachian tube. It can be caused by ear infections, allergies, inhaled irritants, or abnormal growths in the nose or throat. Symptoms may include ear pain or fullness, hearing loss, or ringing in the ears. Mild cases are treated with techniques  to unblock the ears, such as yawning or chewing gum. More severe cases are treated with medicines or procedures. This information is not intended to replace advice given to you by your health care provider. Make sure you discuss any questions you have with your health care provider. Document Revised: 11/04/2020 Document Reviewed: 11/04/2020 Elsevier Patient Education  2024 [employer].

## 2024-05-26 ENCOUNTER — Other Ambulatory Visit (HOSPITAL_BASED_OUTPATIENT_CLINIC_OR_DEPARTMENT_OTHER): Payer: Self-pay

## 2024-05-27 NOTE — Telephone Encounter (Signed)
 Patient with cerumen impaction right curretage and irrigation partially successful 05/25/24 instructed to start debrox drops and follow up next week for re-evaluation

## 2024-06-15 NOTE — Telephone Encounter (Signed)
 Patient reported she was not notified Rx ready at [facility] for pick up re debrox.  Will follow up with them and start drops then follow up for re-evaluation.  She has also scheduled an appt with ENT provider but cannot remember date.  Patient to complete ear drops starting today through Tuesday am then follow up with NP for re-evaluation right auditory canal.  Patient agreed with plan of care and had no further questions at this time.

## 2024-06-22 ENCOUNTER — Telehealth: Payer: Self-pay | Admitting: Registered Nurse

## 2024-06-22 DIAGNOSIS — H6121 Impacted cerumen, right ear: Secondary | ICD-10-CM

## 2024-06-22 NOTE — Telephone Encounter (Signed)
 Patient reported she had procedure done last week did not pick up debrox ear drops.  Planning to do so this week and start them and will follow up with me next week to recheck ear canal right.

## 2024-07-23 ENCOUNTER — Telehealth: Payer: Self-pay | Admitting: Registered Nurse

## 2024-07-23 ENCOUNTER — Encounter: Payer: Self-pay | Admitting: Registered Nurse

## 2024-07-23 DIAGNOSIS — A084 Viral intestinal infection, unspecified: Secondary | ICD-10-CM

## 2024-07-23 NOTE — Telephone Encounter (Signed)
 Patient contacted NP diarrhea again today last episode 3pm.  Headache and congestion resolved with nasal saline and flonase  nasal use.  Discussed due to employer communicable disease policy to stay home due to diarrhea in previous 24 hours scheduled shift.  Patient may work remotely if she desires to and has approval from merchandiser, retail.  Discussed continue bland diet and foods to relieve diarrhea and hydrating with electrolytes e.g. clear soup broths not creamy.  Re-evaluation tomorrow  Supervisor and HR notified excused absence 07/24/24  Patient agreed with plan of care and had no further questions at this time.

## 2024-07-23 NOTE — Telephone Encounter (Signed)
 Patient contacted NP after eating dinner last night had headache, fatigue and diarrhea.  Worked all day Friday without symptoms.  Today having sinus congestion and pressure top of head.  Denied vomiting/fever/chils/cough/sore throat.  Discussed bland diet has received foods to relieve diarrhea handouts in the past did not need resent at this time.  Denied home covid testing discussed to test but covid and flu not common in community at this time. Discussed non covid and flu viruses have been circulating in community and stomach upset has been lasting 3-4 days  If fever/hematochezia/chills/hematemesis to notify NP  Hydrate with electrolytes eg soup clear broth/gatorade/pedialyte.  Patient agreed with plan of care and had no further questions at this time.  Late entry

## 2024-07-26 NOTE — Telephone Encounter (Signed)
 Patient onsite at work today.  Stated last episode of diarrhea was Sunday 07/24/24 afternoon.  Tolerating po intake without difficulty still having some rhinitis/congestion/fatigue.  Denied concerns/dyspnea/dysphagia/fever/chills/n/v/d  Apologized to patient for delay in follow up phone call.  Patient A&Ox3 spoke full sentences without difficulty no audible cough/nasal congestion during 2 minute call.  Continue plan of care as previously discussed.  Patient agreed with plan of care and had no further questions at this time.

## 2024-08-10 NOTE — Telephone Encounter (Signed)
 Patient did go to pharmacy but they told her debrox OTC and she didn't buy that day since ENT appt was scheduled.  She said ear wax was removed at that appt along with hearing test given by provider.  Denied further concerns or questions.

## 2024-08-20 ENCOUNTER — Encounter: Payer: Self-pay | Admitting: Registered Nurse

## 2024-08-20 ENCOUNTER — Telehealth: Payer: Self-pay | Admitting: Registered Nurse

## 2024-08-20 DIAGNOSIS — B349 Viral infection, unspecified: Secondary | ICD-10-CM

## 2024-08-20 NOTE — Telephone Encounter (Signed)
 Spoke with patient via telephone stated stomach upset has resolved and feels able to return to work tomorrow.  Has rested over the weekend tolerating po intake without difficulty.  A&Ox3 no cough/congestion/throat clearing audible during 2 minute call.  Spoke full sentences without difficulty  HR and supervisor notified cleared to return onsite 08/21/24

## 2024-08-20 NOTE — Telephone Encounter (Signed)
 Late entry patient contacted NP not feeling well severe runny eyes and nose, headache, tried loose stools and does not have any covid tests available at home.  Worked from home today cleared with supervisor. Ate lunch and within an hour it had run straight through her and same thing for supper that evening.  Discussed with patient to check temperature in am and will follow up via telephone prior to leaving for work.  Spoke with patient and she stated home temperature 1F Thursday am but still having no appetite and loose stools fatigue  Discussed with patient stay at home and replace electrolytes.  I have recommended clear fluids and advanced to soft as tolerated.  Avoid dairy, spicy and fried foods until diarrhea resolves. Avoid dehydration drink noncaffeinated beverages (water, ginger ale, soup broth, popsicles, no sugar added gatorade/powerade) to urinate every 2-4 hours pale yellow urine.  It is easy to become dehydrated when having diarrhea along with electrolyte imbalances.  Notify NP if temperature greater than 100.64F discussed influenza is circulating in community in higher numbers.  Clinic closed Fri-Sun.  RN Karene returns Monday 08a Medications as directed.  Return to the clinic if symptoms persist or worsen; I have alerted the patient to call if high fever, dehydration, marked weakness, fainting, increased abdominal pain, blood in stool or vomit. Patient given work excuse note for 48 hours 12/10-12/13/25 and re-evaluation via telephone 08/20/24  HR and supervisor notified of excused absence per communicable disease policy.  Patient can use my chart or work from home number to contact NP this weekend if worsening [phone]. Patient verbalized agreement and understanding of treatment plan and had no further questions at this time. P2: Hand washing and fitness

## 2024-08-22 NOTE — Telephone Encounter (Signed)
 Patient returned to work yesterday as expected.  Feeling better today A&Ox3 no audible cough/congestion/rhinitis/throat clearing respirations even and unlabored spoke full sentences without difficulty skin warm dry and pink

## 2024-09-12 ENCOUNTER — Encounter: Payer: Self-pay | Admitting: Registered Nurse

## 2024-09-12 ENCOUNTER — Telehealth: Payer: Self-pay | Admitting: Registered Nurse

## 2024-09-12 DIAGNOSIS — I10 Essential (primary) hypertension: Secondary | ICD-10-CM

## 2024-09-12 MED ORDER — HYDROCHLOROTHIAZIDE 25 MG PO TABS: 25.0000 mg | ORAL_TABLET | Freq: Every day | ORAL | 1 refills | Status: AC

## 2024-09-12 MED ORDER — SALINE SPRAY 0.65 % NA SOLN: 2.0000 | NASAL | Status: AC

## 2024-09-12 NOTE — Telephone Encounter (Signed)
 Patient returned to work after holiday vacation feeling well denied concerns tolerating po intake without difficulty A&Ox3 spoke full sentences without difficulty gait sure and steady respirations even and unlabored RA

## 2024-09-12 NOTE — Telephone Encounter (Signed)
 Patient last filled 90 tabs 02/24/24 had other prn bottles at home that she also used only has been taking 1 tab hydrochlorothiazide  daily 25mg  since sustained weight loss and daily exercise walking.  Dispensed 90 tabs to patient today from [facility].  Next labs due June 2026.  Last BP 120/80 04/20/2024 with PCM  Patient verbalized understanding information/instructions, agreed with plan of care and had no further questions at this time.

## 2024-10-04 ENCOUNTER — Encounter: Payer: Self-pay | Admitting: Nurse Practitioner

## 2024-10-04 DIAGNOSIS — Z1211 Encounter for screening for malignant neoplasm of colon: Secondary | ICD-10-CM

## 2024-11-24 ENCOUNTER — Ambulatory Visit: Admitting: Nurse Practitioner

## 2025-05-21 ENCOUNTER — Encounter: Admitting: Nurse Practitioner
